# Patient Record
Sex: Male | Born: 1975 | Race: White | Hispanic: No | Marital: Married | Smoking: Former smoker
Health system: Southern US, Community
[De-identification: ages and names within clinical notes are randomized; demographics above are authoritative.]

## PROBLEM LIST (undated history)

## (undated) DIAGNOSIS — M1990 Unspecified osteoarthritis, unspecified site: Secondary | ICD-10-CM

## (undated) DIAGNOSIS — C20 Malignant neoplasm of rectum: Secondary | ICD-10-CM

## (undated) DIAGNOSIS — N200 Calculus of kidney: Secondary | ICD-10-CM

## (undated) DIAGNOSIS — Z8042 Family history of malignant neoplasm of prostate: Secondary | ICD-10-CM

## (undated) DIAGNOSIS — E079 Disorder of thyroid, unspecified: Secondary | ICD-10-CM

## (undated) HISTORY — DX: Family history of malignant neoplasm of prostate: Z80.42

## (undated) HISTORY — DX: Calculus of kidney: N20.0

## (undated) HISTORY — DX: Disorder of thyroid, unspecified: E07.9

## (undated) HISTORY — DX: Malignant neoplasm of rectum: C20

## (undated) HISTORY — DX: Unspecified osteoarthritis, unspecified site: M19.90

---

## 2018-07-16 ENCOUNTER — Other Ambulatory Visit: Payer: Self-pay

## 2018-07-16 ENCOUNTER — Ambulatory Visit (INDEPENDENT_AMBULATORY_CARE_PROVIDER_SITE_OTHER)
Admission: RE | Admit: 2018-07-16 | Discharge: 2018-07-16 | Disposition: A | Discharge status: Home / Self Care | Payer: BC Managed Care – PPO | Source: Ambulatory Visit | Attending: Family | Admitting: Family

## 2018-07-16 ENCOUNTER — Other Ambulatory Visit: Payer: Self-pay | Admitting: Family

## 2018-07-16 ENCOUNTER — Other Ambulatory Visit (INDEPENDENT_AMBULATORY_CARE_PROVIDER_SITE_OTHER): Payer: BC Managed Care – PPO

## 2018-07-16 ENCOUNTER — Encounter: Payer: Self-pay | Admitting: Family

## 2018-07-16 ENCOUNTER — Ambulatory Visit (INDEPENDENT_AMBULATORY_CARE_PROVIDER_SITE_OTHER): Payer: BC Managed Care – PPO | Admitting: Family

## 2018-07-16 VITALS — BP 114/80 | HR 58 | Temp 97.9°F | Ht 69.0 in | Wt 174.0 lb

## 2018-07-16 DIAGNOSIS — M542 Cervicalgia: Secondary | ICD-10-CM

## 2018-07-16 DIAGNOSIS — Z125 Encounter for screening for malignant neoplasm of prostate: Secondary | ICD-10-CM

## 2018-07-16 DIAGNOSIS — M546 Pain in thoracic spine: Secondary | ICD-10-CM

## 2018-07-16 DIAGNOSIS — Z0000 Encounter for general adult medical examination without abnormal findings: Secondary | ICD-10-CM | POA: Diagnosis not present

## 2018-07-16 DIAGNOSIS — R5383 Other fatigue: Secondary | ICD-10-CM

## 2018-07-16 DIAGNOSIS — E039 Hypothyroidism, unspecified: Secondary | ICD-10-CM

## 2018-07-16 DIAGNOSIS — Z8042 Family history of malignant neoplasm of prostate: Secondary | ICD-10-CM | POA: Insufficient documentation

## 2018-07-16 DIAGNOSIS — Z13220 Encounter for screening for lipoid disorders: Secondary | ICD-10-CM

## 2018-07-16 DIAGNOSIS — Z114 Encounter for screening for human immunodeficiency virus [HIV]: Secondary | ICD-10-CM | POA: Diagnosis not present

## 2018-07-16 DIAGNOSIS — G8929 Other chronic pain: Secondary | ICD-10-CM

## 2018-07-16 DIAGNOSIS — R899 Unspecified abnormal finding in specimens from other organs, systems and tissues: Secondary | ICD-10-CM

## 2018-07-16 LAB — CBC WITH DIFFERENTIAL/PLATELET
Basophils Absolute: 0 10*3/uL (ref 0.0–0.1)
Basophils Relative: 0.8 % (ref 0.0–3.0)
Eosinophils Absolute: 0.2 10*3/uL (ref 0.0–0.7)
Eosinophils Relative: 3 % (ref 0.0–5.0)
HCT: 41 % (ref 39.0–52.0)
Hemoglobin: 14.4 g/dL (ref 13.0–17.0)
Lymphocytes Relative: 26.2 % (ref 12.0–46.0)
Lymphs Abs: 1.4 10*3/uL (ref 0.7–4.0)
MCHC: 35 g/dL (ref 30.0–36.0)
MCV: 90.7 fl (ref 78.0–100.0)
Monocytes Absolute: 0.4 10*3/uL (ref 0.1–1.0)
Monocytes Relative: 8.6 % (ref 3.0–12.0)
Neutro Abs: 3.2 10*3/uL (ref 1.4–7.7)
Neutrophils Relative %: 61.4 % (ref 43.0–77.0)
Platelets: 138 10*3/uL — ABNORMAL LOW (ref 150.0–400.0)
RBC: 4.53 Mil/uL (ref 4.22–5.81)
RDW: 13.3 % (ref 11.5–15.5)
WBC: 5.3 10*3/uL (ref 4.0–10.5)

## 2018-07-16 LAB — COMPREHENSIVE METABOLIC PANEL
ALT: 13 U/L (ref 0–53)
AST: 18 U/L (ref 0–37)
Albumin: 4.6 g/dL (ref 3.5–5.2)
Alkaline Phosphatase: 52 U/L (ref 39–117)
BUN: 13 mg/dL (ref 6–23)
CO2: 27 mEq/L (ref 19–32)
Calcium: 9.2 mg/dL (ref 8.4–10.5)
Chloride: 105 mEq/L (ref 96–112)
Creatinine, Ser: 0.83 mg/dL (ref 0.40–1.50)
GFR: 100.76 mL/min (ref >60.00)
Glucose, Bld: 81 mg/dL (ref 70–99)
Potassium: 3.7 mEq/L (ref 3.5–5.1)
Sodium: 141 mEq/L (ref 135–145)
Total Bilirubin: 0.6 mg/dL (ref 0.2–1.2)
Total Protein: 6.8 g/dL (ref 6.0–8.3)

## 2018-07-16 LAB — LIPID PANEL
Cholesterol: 200 mg/dL (ref 0–200)
HDL: 41 mg/dL (ref >39.00)
NonHDL: 158.97
Total CHOL/HDL Ratio: 5
Triglycerides: 234 mg/dL — ABNORMAL HIGH (ref 0.0–149.0)
VLDL: 46.8 mg/dL — ABNORMAL HIGH (ref 0.0–40.0)

## 2018-07-16 LAB — LDL CHOLESTEROL, DIRECT: Direct LDL: 114 mg/dL

## 2018-07-16 LAB — TSH: TSH: 35.38 u[IU]/mL — ABNORMAL HIGH (ref 0.35–4.50)

## 2018-07-16 LAB — PSA: PSA: 0.79 ng/mL (ref 0.10–4.00)

## 2018-07-16 NOTE — Progress Notes (Signed)
Reviewed with patient; keep planned follow-up with Dr. Tamala Julian.

## 2018-07-16 NOTE — Progress Notes (Signed)
Edward [name] is a 43 y.o. male with the following history as recorded in Epic[facility]:  Patient Active Problem List   Diagnosis Date Noted  . Family history of prostate cancer in father 07/16/2018    No current outpatient medications on file.   No current facility-administered medications for this visit.     Allergies: Patient has no known allergies.  History reviewed. No pertinent past medical history.  History reviewed. No pertinent surgical history.  Family History  Problem Relation Age of Onset  . Depression Mother   . Asthma Father   . Cancer Father   . CAD Father   . Hyperlipidemia Father   . Hypertension Father     Social History   Tobacco Use  . Smoking status: Former Research scientist (life sciences)  . Smokeless tobacco: Never Used  Substance Use Topics  . Alcohol use: Yes    Comment: social    Subjective:  Patient presents today as a new patient;  needs to establish with PCP; last CPE 2+ years ago;  Acute right knee pain/chronic upper back pain; mentions chronic insomnia problems- feels that he wakes himself up around 4 am/ feels like he is snoring more but does not feel that he stops breathing; has occasional problems with hemorrhoids;   Up to date on dental and eye exams;   Review of Systems  Constitutional: Negative.   HENT: Negative.   Respiratory: Negative.   Cardiovascular: Negative.   Gastrointestinal:       Hemorrhoids; vegetarian x 20 years  Genitourinary: Positive for frequency.       History of kidney stones  Musculoskeletal: Positive for back pain, joint pain and neck pain.  Skin: Negative.   Neurological: Negative.   Endo/Heme/Allergies: Negative.   Psychiatric/Behavioral: The patient has insomnia.       Objective:  Vitals:   07/16/18 0905  BP: 114/80  Pulse: (!) 58  Temp: 97.9 F (36.6 C)  TempSrc: Oral  SpO2: 98%  Weight: 174 lb 0.6 oz (78.9 kg)  Height: _0  (1.753 m)    General: Well developed, well nourished, in no acute distress  Skin : Warm and  dry.  Head: Normocephalic and atraumatic  Eyes: Sclera and conjunctiva clear; pupils round and reactive to light; extraocular movements intact  Ears: External normal; canals clear; tympanic membranes normal  Oropharynx: Pink, supple. No suspicious lesions  Neck: Supple without thyromegaly, adenopathy  Lungs: Respirations unlabored; clear to auscultation bilaterally without wheeze, rales, rhonchi  CVS exam: normal rate and regular rhythm.  Abdomen: Soft; nontender; nondistended; normoactive bowel sounds; no masses or hepatosplenomegaly  Musculoskeletal: No deformities; no active joint inflammation  Extremities: No edema, cyanosis, clubbing  Vessels: Symmetric bilaterally  Neurologic: Alert and oriented; speech intact; face symmetrical; moves all extremities well; CNII-XII intact without focal deficit   Assessment:  1. PE (physical exam), annual   2. Lipid screening   3. Encounter for screening for HIV   4. Neck pain   5. Chronic thoracic back pain, unspecified back pain laterality   6. Other fatigue   7. Family history of prostate cancer in father     Plan:   Age appropriate preventive healthcare needs addressed; encouraged regular eye doctor and dental exams; encouraged regular exercise; will update labs and refills as needed today; follow-up to be determined; Refer to urology due to [place] of prostate cancer at young age; Follow-up with Dr. Tamala Julian to evaluate knee and back pain; will update X-rays today;  Patient will get more information from his  girlfriend to determine if she ever hears him stop breathing- may need to get sleep study/ could also consider Trazodone; Discussed use of stool softeners, fiber, water, OTC treatment to help with hemorrhoids; may need to refer to GI for further evaluation.    No follow-ups on file.  Orders Placed This Encounter  Procedures  . DG Cervical Spine 2 or 3 views    Standing Status:   Future    Number of Occurrences:   1    Standing Expiration  Date:   09/15/2019    Order Specific Question:   Reason for Exam (SYMPTOM  OR DIAGNOSIS REQUIRED)    Answer:   neck pain    Order Specific Question:   Preferred imaging location?    Answer:   [facility]    Order Specific Question:   Radiology Contrast Protocol - do NOT remove file path    Answer:   \\[email or web address]\DXFluoroContrastProtocols.pdf  . DG Thoracic Spine 2 View    Standing Status:   Future    Number of Occurrences:   1    Standing Expiration Date:   09/15/2019    Order Specific Question:   Reason for Exam (SYMPTOM  OR DIAGNOSIS REQUIRED)    Answer:   back pain    Order Specific Question:   Preferred imaging location?    Answer:   [facility]    Order Specific Question:   Radiology Contrast Protocol - do NOT remove file path    Answer:   \\[email or web address]\DXFluoroContrastProtocols.pdf  . CBC w/Diff    Standing Status:   Future    Standing Expiration Date:   07/16/2019  . Comp Met (CMET)    Standing Status:   Future    Standing Expiration Date:   07/16/2019  . Lipid panel    Standing Status:   Future    Standing Expiration Date:   07/16/2019  . PSA    Standing Status:   Future    Standing Expiration Date:   07/16/2019  . HIV Antibody (routine testing w rflx)    Standing Status:   Future    Standing Expiration Date:   07/16/2019  . TSH    Standing Status:   Future    Standing Expiration Date:   07/16/2019  . Ambulatory referral to Urology    Referral Priority:   Routine    Referral Type:   Consultation    Referral Reason:   Specialty Services Required    Requested Specialty:   Urology    Number of Visits Requested:   1    Requested Prescriptions    No prescriptions requested or ordered in this encounter

## 2018-07-16 NOTE — Progress Notes (Signed)
Reviewed with patient; he will come to the office for further thyroid testing; he understands need for thyroid ultrasound as well.

## 2018-07-16 NOTE — Progress Notes (Signed)
Discussed with patient; keep planned follow-up with Dr. Tamala Julian.

## 2018-07-17 ENCOUNTER — Other Ambulatory Visit: Payer: BC Managed Care – PPO

## 2018-07-17 DIAGNOSIS — E039 Hypothyroidism, unspecified: Secondary | ICD-10-CM

## 2018-07-17 DIAGNOSIS — R899 Unspecified abnormal finding in specimens from other organs, systems and tissues: Secondary | ICD-10-CM

## 2018-07-17 LAB — HIV ANTIBODY (ROUTINE TESTING W REFLEX): HIV 1&2 Ab, 4th Generation: NONREACTIVE

## 2018-07-18 ENCOUNTER — Other Ambulatory Visit: Payer: Self-pay | Admitting: Family

## 2018-07-18 LAB — THYROID PANEL WITH TSH
Free Thyroxine Index: 1.3 — ABNORMAL LOW (ref 1.4–3.8)
T3 Uptake: 27 % (ref 22–35)
T4, Total: 4.8 ug/dL — ABNORMAL LOW (ref 4.9–10.5)
TSH: 33.74 mIU/L — ABNORMAL HIGH (ref 0.40–4.50)

## 2018-07-18 LAB — THYROID PEROXIDASE ANTIBODY: Thyroperoxidase Ab SerPl-aCnc: 111 IU/mL — ABNORMAL HIGH (ref <9)

## 2018-07-18 LAB — THYROGLOBULIN ANTIBODY: Thyroglobulin Ab: 3 IU/mL — ABNORMAL HIGH (ref <=1)

## 2018-07-18 MED ORDER — LEVOTHYROXINE SODIUM 50 MCG PO TABS: 50.0000 ug | ORAL_TABLET | Freq: Every day | ORAL | 1 refills | Status: DC

## 2018-07-24 NOTE — Progress Notes (Signed)
[name] [address], [place] [address] Phone: [phone] Subjective:   I [name] am serving as a Education administrator for Dr. Hulan Saas.  I'm seeing this patient by the request  of:  Marrian Salvage, FNP   CC: Right knee and back pain  [id number]  Jacson Rapaport is a 43 y.o. male coming in with complaint of right knee and back pain. Injured his knee running. Pain in shoulder blades keeps him up at night.    Onset- 2 months (knee), chronic back (about 10 years) Location - superior patella, between the shoulder blades  Duration-  Character- achy, sharp, feels like knots is between his shoulder blades  Aggravating factors- down hill movement, lateral movement   Therapies tried- RICE, foam rolling, tennis ball, stretching, yoga  Severity-5 out of 10   X-rays taken July 16, 2018.  Independently visualized by me.  Anterior osteophytes at C3-C4 of the cervical spine thoracic x-ray is unremarkable.  No past medical history on file. No past surgical history on file. Social History   Socioeconomic History  . Marital status: Single    Spouse name: Not on file  . Number of children: Not on file  . Years of education: Not on file  . Highest education level: Not on file  Occupational History  . Not on file  Social Needs  . Financial resource strain: Not on file  . Food insecurity:    Worry: Not on file    Inability: Not on file  . Transportation needs:    Medical: Not on file    Non-medical: Not on file  Tobacco Use  . Smoking status: Former Research scientist (life sciences)  . Smokeless tobacco: Never Used  Substance and Sexual Activity  . Alcohol use: Yes    Comment: social  . Drug use: Not on file  . Sexual activity: Not on file  Lifestyle  . Physical activity:    Days per week: Not on file    Minutes per session: Not on file  . Stress: Not on file  Relationships  . Social connections:    Talks on phone: Not on file    Gets together: Not on  file    Attends religious service: Not on file    Active member of club or organization: Not on file    Attends meetings of clubs or organizations: Not on file    Relationship status: Not on file  Other Topics Concern  . Not on file  Social History Narrative  . Not on file   No Known Allergies Family History  Problem Relation Age of Onset  . Depression Mother   . Asthma Father   . Cancer Father   . CAD Father   . Hyperlipidemia Father   . Hypertension Father     Current Outpatient Medications (Endocrine & Metabolic):  .  levothyroxine (SYNTHROID) 50 MCG tablet, Take 1 tablet (50 mcg total) by mouth daily.      Current Outpatient Medications (Other):  Marland Kitchen  Diclofenac Sodium 2 % SOLN, Place 2 g onto the skin 2 (two) times daily.    Past medical history, social, surgical and family history all reviewed in electronic medical record.  No pertanent information unless stated regarding to the chief complaint.   Review of Systems:  No headache, visual changes, nausea, vomiting, diarrhea, constipation, dizziness, abdominal pain, skin rash, fevers, chills, night sweats, weight loss, swollen lymph nodes, body aches, joint swelling, muscle aches, chest pain, shortness of  breath, mood changes.   Objective  Blood pressure 122/82, pulse (!) 58, height 5\' 9"  (1.753 m), weight 173 lb (78.5 kg), SpO2 99 %.    General: No apparent distress alert and oriented x3 mood and affect normal, dressed appropriately.  HEENT: Pupils equal, extraocular movements intact  Respiratory: Patient's speak in full sentences and does not appear short of breath  Cardiovascular: No lower extremity edema, non tender, no erythema  Skin: Warm dry intact with no signs of infection or rash on extremities or on axial skeleton.  Abdomen: Soft nontender  Neuro: Cranial nerves II through XII are intact, neurovascularly intact in all extremities with 2+ DTRs and 2+ pulses.  Lymph: No lymphadenopathy of posterior or  anterior cervical chain or axillae bilaterally.  Gait normal with good balance and coordination.  MSK:  Non tender with full range of motion and good stability and symmetric strength and tone of shoulders, elbows, wrist, hip, and ankles bilaterally.  Knee: right  Normal to inspection with no erythema or effusion or obvious bony abnormalities. Palpation normal with no warmth, joint line tenderness, patellar tenderness, or condyle tenderness. ROM full in flexion and extension and lower leg rotation. Ligaments with solid consistent endpoints including ACL, PCL, LCL, MCL. Negative Mcmurray's, Apley's, and Thessalonian tests. Non painful patellar compression. Patellar glide with mild crepitus. Patellar and quadriceps tendons unremarkable. Hamstring and quadriceps strength is normal. Neck: Inspection loss of lordosis . No palpable stepoffs. Negative Spurling's maneuver. Some mild loss of range of motion. Grip strength and sensation normal in bilateral hands Strength good C4 to T1 distribution No sensory change to C4 to T1 Negative Hoffman sign bilaterally Reflexes normal Is in the left trapezius  MSK US performed of: Right knee This study was ordered, performed, and interpreted by Charlann Boxer D.O.  Knee: All structures visualized. Anteromedial, anterolateral, posteromedial, and posterolateral menisci unremarkable without tearing, fraying, effusion, or displacement.  Mild narrowing of the patellofemoral joint Patellar Tendon unremarkable on long and transverse views without effusion. No abnormality of prepatellar bursa. LCL and MCL unremarkable on long and transverse views. No abnormality of origin of medial or lateral head of the gastrocnemius.  IMPRESSION: Nothing significant mild narrowing of the patellofemoral   Impression and Recommendations:     This case required medical decision making of moderate complexity. The above documentation has been reviewed and is accurate and  complete Lyndal Pulley, DO       Note: This dictation was prepared with Dragon dictation along with smaller phrase technology. Any transcriptional errors that result from this process are unintentional.

## 2018-07-25 ENCOUNTER — Telehealth: Payer: Self-pay | Admitting: Family

## 2018-07-25 ENCOUNTER — Other Ambulatory Visit: Payer: Self-pay | Admitting: Family

## 2018-07-25 ENCOUNTER — Ambulatory Visit (INDEPENDENT_AMBULATORY_CARE_PROVIDER_SITE_OTHER): Payer: BC Managed Care – PPO | Admitting: Family Medicine

## 2018-07-25 ENCOUNTER — Other Ambulatory Visit: Payer: Self-pay

## 2018-07-25 ENCOUNTER — Encounter: Payer: Self-pay | Admitting: Family Medicine

## 2018-07-25 ENCOUNTER — Ambulatory Visit: Payer: Self-pay

## 2018-07-25 VITALS — BP 122/82 | HR 58 | Ht 69.0 in | Wt 173.0 lb

## 2018-07-25 DIAGNOSIS — G8929 Other chronic pain: Secondary | ICD-10-CM

## 2018-07-25 DIAGNOSIS — G2589 Other specified extrapyramidal and movement disorders: Secondary | ICD-10-CM | POA: Diagnosis not present

## 2018-07-25 DIAGNOSIS — M25561 Pain in right knee: Secondary | ICD-10-CM | POA: Insufficient documentation

## 2018-07-25 DIAGNOSIS — E039 Hypothyroidism, unspecified: Secondary | ICD-10-CM

## 2018-07-25 MED ORDER — DICLOFENAC SODIUM 2 % TD SOLN: 2.0000 g | Freq: Two times a day (BID) | TRANSDERMAL | 3 refills | Status: DC

## 2018-07-25 NOTE — Telephone Encounter (Signed)
Glitch on my end with ordering- thanks for letting us know; order has been fixed.

## 2018-07-25 NOTE — Assessment & Plan Note (Signed)
Scapular dyskinesis.  Discussed which activities of doing which wants to avoid.  Topical anti-inflammatories.  Worsening symptoms consider formal physical therapy.  Does have some arthritic changes in the neck but do not think it is likely contributing.

## 2018-07-25 NOTE — Patient Instructions (Addendum)
Good to see you.  Ice 20 minutes 2 times daily. Usually after activity and before bed. Exercises 3 times a week.  pennsaid pinkie amount topically 2 times daily as needed.  Hashimoto disease Vitamin D 2000 IU daily  See me again in 4-6 weeks

## 2018-07-25 NOTE — Telephone Encounter (Signed)
Patient was following up on an ultrasound that he said Edward [name] wanted done on his thyroid. I do not see any orders. Can you help with this?

## 2018-07-25 NOTE — Assessment & Plan Note (Signed)
Patellofemoral arthralgia.  Discussed with patient in great length about posture and ergonomics.  Which activities of doing which wants to avoid.  We discussed the importance of vastus medialis oblique strengthening.  Ultrasound did not show anything significant.  Follow-up again in 4 to 6 weeks

## 2018-08-02 ENCOUNTER — Ambulatory Visit
Admission: RE | Admit: 2018-08-02 | Discharge: 2018-08-02 | Disposition: A | Discharge status: Home / Self Care | Payer: BC Managed Care – PPO | Source: Ambulatory Visit | Attending: Family | Admitting: Family

## 2018-08-02 DIAGNOSIS — E039 Hypothyroidism, unspecified: Secondary | ICD-10-CM

## 2018-08-02 NOTE — Telephone Encounter (Signed)
Patient was called and contacted with appointment info. Test was scheduled for yesterday.

## 2018-08-13 ENCOUNTER — Other Ambulatory Visit: Payer: BC Managed Care – PPO

## 2018-08-13 ENCOUNTER — Encounter: Payer: Self-pay | Admitting: Family

## 2018-08-13 ENCOUNTER — Other Ambulatory Visit: Payer: Self-pay

## 2018-08-13 ENCOUNTER — Ambulatory Visit (INDEPENDENT_AMBULATORY_CARE_PROVIDER_SITE_OTHER): Payer: BC Managed Care – PPO | Admitting: Family

## 2018-08-13 VITALS — BP 112/72 | HR 57 | Temp 98.0°F | Ht 69.0 in | Wt 172.0 lb

## 2018-08-13 DIAGNOSIS — E039 Hypothyroidism, unspecified: Secondary | ICD-10-CM | POA: Diagnosis not present

## 2018-08-13 DIAGNOSIS — L989 Disorder of the skin and subcutaneous tissue, unspecified: Secondary | ICD-10-CM | POA: Diagnosis not present

## 2018-08-13 NOTE — Progress Notes (Signed)
  Edward [name] is a 43 y.o. male with the following history as recorded in Epic[facility]:  Patient Active Problem List   Diagnosis Date Noted  . Patellofemoral arthralgia of right knee 07/25/2018  . Scapular dyskinesis 07/25/2018  . Family history of prostate cancer in father 07/16/2018    Current Outpatient Medications  Medication Sig Dispense Refill  . cholecalciferol (VITAMIN D3) 25 MCG (1000 UT) tablet Take 1,000 Units by mouth 2 (two) times a day.    . Diclofenac Sodium 2 % SOLN Place 2 g onto the skin 2 (two) times daily. 112 g 3  . levothyroxine (SYNTHROID) 50 MCG tablet Take 1 tablet (50 mcg total) by mouth daily. 30 tablet 1   No current facility-administered medications for this visit.     Allergies: Patient has no known allergies.  History reviewed. No pertinent past medical history.  History reviewed. No pertinent surgical history.  Family History  Problem Relation Age of Onset  . Depression Mother   . Asthma Father   . Cancer Father   . CAD Father   . Hyperlipidemia Father   . Hypertension Father     Social History   Tobacco Use  . Smoking status: Former Research scientist (life sciences)  . Smokeless tobacco: Never Used  Substance Use Topics  . Alcohol use: Yes    Comment: social    Subjective:  Follow-up on recent start of Synthroid; notes that in general, has been feeling better; feels like he has slept better- finding it easier to get up/ focusing better; feels like GI symptoms discussed at last OV are normalizing as well;   Also requesting to see dermatology for "head to toe" skin check/ possible mole removal  Has been contacted by urology- will need to schedule appt/ also plans to ask about vasectomy  Objective:  Vitals:   08/13/18 0855  BP: 112/72  Pulse: (!) 57  Temp: 98 F (36.7 C)  TempSrc: Oral  SpO2: 97%  Weight: 172 lb 0.6 oz (78 kg)  Height: 5\' 9"  (1.753 m)    General: Well developed, well nourished, in no acute distress  Skin : Warm and dry.  Head: Normocephalic  and atraumatic  Neck: Supple without thyromegaly, adenopathy  Lungs: Respirations unlabored; Neurologic: Alert and oriented; speech intact; face symmetrical; moves all extremities well; CNII-XII intact without focal deficit   Assessment:  1. Hypothyroidism, unspecified type   2. Skin lesions, generalized     Plan:  1. Responding well to treatment; updated labs today- follow-up to be determined; reviewed ultrasound results as well; 2. Dermatology referral updated;   No follow-ups on file.  Orders Placed This Encounter  Procedures  . Thyroid Panel With TSH    Standing Status:   Future    Number of Occurrences:   1    Standing Expiration Date:   08/13/2019  . Ambulatory referral to Dermatology    Referral Priority:   Routine    Referral Type:   Consultation    Referral Reason:   Specialty Services Required    Requested Specialty:   Dermatology    Number of Visits Requested:   1    Requested Prescriptions    No prescriptions requested or ordered in this encounter

## 2018-08-14 ENCOUNTER — Other Ambulatory Visit: Payer: Self-pay | Admitting: Family

## 2018-08-14 DIAGNOSIS — E039 Hypothyroidism, unspecified: Secondary | ICD-10-CM

## 2018-08-14 LAB — THYROID PANEL WITH TSH
Free Thyroxine Index: 2.3 (ref 1.4–3.8)
T3 Uptake: 30 % (ref 22–35)
T4, Total: 7.7 ug/dL (ref 4.9–10.5)
TSH: 8.27 mIU/L — ABNORMAL HIGH (ref 0.40–4.50)

## 2018-08-14 MED ORDER — LEVOTHYROXINE SODIUM 75 MCG PO TABS: 75.0000 ug | ORAL_TABLET | Freq: Every day | ORAL | 1 refills | Status: DC

## 2018-08-29 ENCOUNTER — Ambulatory Visit (INDEPENDENT_AMBULATORY_CARE_PROVIDER_SITE_OTHER): Payer: BC Managed Care – PPO | Admitting: Family Medicine

## 2018-08-29 ENCOUNTER — Other Ambulatory Visit: Payer: Self-pay

## 2018-08-29 ENCOUNTER — Encounter: Payer: Self-pay | Admitting: Family Medicine

## 2018-08-29 VITALS — BP 124/90 | HR 56 | Ht 69.0 in

## 2018-08-29 DIAGNOSIS — M25512 Pain in left shoulder: Secondary | ICD-10-CM | POA: Insufficient documentation

## 2018-08-29 DIAGNOSIS — G8929 Other chronic pain: Secondary | ICD-10-CM | POA: Diagnosis not present

## 2018-08-29 DIAGNOSIS — M25561 Pain in right knee: Secondary | ICD-10-CM

## 2018-08-29 NOTE — Progress Notes (Signed)
[name] [address] [place], [place] [address] Phone: [phone] Subjective:   I [name] am serving as a Education administrator for Dr. Hulan Saas.  I'm seeing this patient by the request  of:  Marrian Salvage, FNP   CC: Back pain and right knee pain follow-up  [id number]   07/25/2018: Patellofemoral arthralgia.  Discussed with patient in great length about posture and ergonomics.  Which activities of doing which wants to avoid.  We discussed the importance of vastus medialis oblique strengthening.  Ultrasound did not show anything significant.  Follow-up again in 4 to 6 weeks  Scapular dyskinesis.  Discussed which activities of doing which wants to avoid.  Topical anti-inflammatories.  Worsening symptoms consider formal physical therapy.  Does have some arthritic changes in the neck but do not think it is likely contributing.  Edward [name] is a 43 y.o. male coming in with complaint of knee and shoulder pain. States he has had some improvement.  Patient shoulder seems to be doing well.  States that there is distinct places over the shoulder that seems to give him more discomfort and pain.  Sometimes affects daily activities.  Tries to workout regularly but finds it difficult secondary to pain.  Patient is also having still some knee pain but would state 85% better.  No locking or giving out on him.  Able to run up to 4 miles without any significant discomfort.     No past medical history on file. No past surgical history on file. Social History   Socioeconomic History  . Marital status: Single    Spouse name: Not on file  . Number of children: Not on file  . Years of education: Not on file  . Highest education level: Not on file  Occupational History  . Not on file  Social Needs  . Financial resource strain: Not on file  . Food insecurity    Worry: Not on file    Inability: Not on file  . Transportation needs    Medical: Not on file     Non-medical: Not on file  Tobacco Use  . Smoking status: Former Research scientist (life sciences)  . Smokeless tobacco: Never Used  Substance and Sexual Activity  . Alcohol use: Yes    Comment: social  . Drug use: Not on file  . Sexual activity: Not on file  Lifestyle  . Physical activity    Days per week: Not on file    Minutes per session: Not on file  . Stress: Not on file  Relationships  . Social Herbalist on phone: Not on file    Gets together: Not on file    Attends religious service: Not on file    Active member of club or organization: Not on file    Attends meetings of clubs or organizations: Not on file    Relationship status: Not on file  Other Topics Concern  . Not on file  Social History Narrative  . Not on file   No Known Allergies Family History  Problem Relation Age of Onset  . Depression Mother   . Asthma Father   . Cancer Father   . CAD Father   . Hyperlipidemia Father   . Hypertension Father     Current Outpatient Medications (Endocrine & Metabolic):  .  levothyroxine (SYNTHROID) 75 MCG tablet, Take 1 tablet (75 mcg total) by mouth daily.      Current Outpatient Medications (Other):  .  cholecalciferol (VITAMIN D3) 25 MCG (1000 UT) tablet, Take 1,000 Units by mouth 2 (two) times a day. .  Diclofenac Sodium 2 % SOLN, Place 2 g onto the skin 2 (two) times daily.    Past medical history, social, surgical and family history all reviewed in electronic medical record.  No pertanent information unless stated regarding to the chief complaint.   Review of Systems:  No headache, visual changes, nausea, vomiting, diarrhea, constipation, dizziness, abdominal pain, skin rash, fevers, chills, night sweats, weight loss, swollen lymph nodes, body aches, joint swelling, muscle aches, chest pain, shortness of breath, mood changes.   Objective  Blood pressure 124/90, pulse (!) 56, height 5\' 9"  (1.753 m), SpO2 98 %.    General: No apparent distress alert and oriented x3  mood and affect normal, dressed appropriately.  HEENT: Pupils equal, extraocular movements intact  Respiratory: Patient's speak in full sentences and does not appear short of breath  Cardiovascular: No lower extremity edema, non tender, no erythema  Skin: Warm dry intact with no signs of infection or rash on extremities or on axial skeleton.  Abdomen: Soft nontender  Neuro: Cranial nerves II through XII are intact, neurovascularly intact in all extremities with 2+ DTRs and 2+ pulses.  Lymph: No lymphadenopathy of posterior or anterior cervical chain or axillae bilaterally.  Gait normal with good balance and coordination.  MSK:  Non tender with full range of motion and good stability and symmetric strength and tone of shoulders, elbows, wrist, hip, and ankles bilaterally.  Knee: right  Normal to inspection with no erythema or effusion or obvious bony abnormalities. Palpation normal with no warmth, joint line tenderness, patellar tenderness, or condyle tenderness. ROM full in flexion and extension and lower leg rotation. Ligaments with solid consistent endpoints including ACL, PCL, LCL, MCL. Negative Mcmurray's, Apley's, and Thessalonian tests. Mild painful patellar compression. Patellar glide with mild crepitus. Patellar and quadriceps tendons unremarkable. Hamstring and quadriceps strength is normal. Contralateral knee unremarkable   Back Exam:  Inspection: Unremarkable  Motion: Flexion 45 deg, Extension 25 deg, Side Bending to 35 deg bilaterally,  Rotation to 45 deg bilaterally  SLR laying: Negative  XSLR laying: Negative  Palpable tenderness: Multiple trigger points noted in the left scapular region medial border.  Seems to be muscle and rhomboids.Marland Kitchen FABER: negative. Sensory change: Gross sensation intact to all lumbar and sacral dermatomes.  Reflexes: 2+ at both patellar tendons, 2+ at achilles tendons, Babinski's downgoing.  Strength at foot  Plantar-flexion: 5/5 Dorsi-flexion:  5/5 Eversion: 5/5 Inversion: 5/5  Leg strength  Quad: 5/5 Hamstring: 5/5 Hip flexor: 5/5 Hip abductors: 5/5  Gait unremarkable.  After verbal consent patient was prepped with alcohol swabs and with a 25-gauge 1 inch needle injected in 3 distinct trigger points in the left periscapular region.  3 cc of 0.5% Marcaine and 1 cc of Kenalog 40 mg/mL used.  Patient tolerated the procedure well.     Impression and Recommendations:     This case required medical decision making of moderate complexity. The above documentation has been reviewed and is accurate and complete Lyndal Pulley, DO       Note: This dictation was prepared with Dragon dictation along with smaller phrase technology. Any transcriptional errors that result from this process are unintentional.

## 2018-08-29 NOTE — Patient Instructions (Addendum)
Good to see you Tens and ultrasound at home Ice is your friend Keep hands within peripheral vision Look into theragun  Tried trigger point injections Knee looks good an decrease running slowly See me again in 6-8 weeks

## 2018-08-29 NOTE — Assessment & Plan Note (Signed)
Patient did have injections today.  Tolerated the procedure well.  Discussed icing regimen and home exercise.  Discussed topical anti-inflammatories.  Follow-up again 4 to 8 weeks.  Continue to work on the scapular dyskinesis

## 2018-08-29 NOTE — Assessment & Plan Note (Signed)
Improvement noted.  Continue with conservative therapy.  Increase activity as tolerated.  Did discuss the possibility of x-rays to further evaluate for any bony abnormality but I think patient is doing well.

## 2018-09-16 ENCOUNTER — Encounter: Payer: Self-pay | Admitting: Family

## 2018-09-17 ENCOUNTER — Other Ambulatory Visit: Payer: Self-pay | Admitting: Family

## 2018-09-17 ENCOUNTER — Other Ambulatory Visit (INDEPENDENT_AMBULATORY_CARE_PROVIDER_SITE_OTHER): Payer: BC Managed Care – PPO

## 2018-09-17 DIAGNOSIS — E039 Hypothyroidism, unspecified: Secondary | ICD-10-CM | POA: Diagnosis not present

## 2018-09-17 DIAGNOSIS — E063 Autoimmune thyroiditis: Secondary | ICD-10-CM

## 2018-09-17 DIAGNOSIS — E038 Other specified hypothyroidism: Secondary | ICD-10-CM

## 2018-09-17 LAB — TSH: TSH: 2.61 u[IU]/mL (ref 0.35–4.50)

## 2018-09-17 MED ORDER — LEVOTHYROXINE SODIUM 75 MCG PO TABS: 75.0000 ug | ORAL_TABLET | Freq: Every day | ORAL | 1 refills | Status: DC

## 2018-09-28 ENCOUNTER — Encounter: Payer: Self-pay | Admitting: Family Medicine

## 2018-09-28 ENCOUNTER — Encounter: Payer: Self-pay | Admitting: Family

## 2018-10-07 NOTE — Progress Notes (Deleted)
[name] [address] [place], [place] [address] Phone: [phone] Subjective:      CC: Left shoulder pain follow-up  [id number]  Lorik Boyea is a 43 y.o. male coming in with complaint of ***  Onset-  Location Duration-  Character- Aggravating factors- Reliving factors-  Therapies tried-  Severity-   Patient in previous exam was found to have more of a scapular dyskinesis is trigger points were given August 29, 2018.  No past medical history on file. No past surgical history on file. Social History   Socioeconomic History  . Marital status: Single    Spouse name: Not on file  . Number of children: Not on file  . Years of education: Not on file  . Highest education level: Not on file  Occupational History  . Not on file  Social Needs  . Financial resource strain: Not on file  . Food insecurity    Worry: Not on file    Inability: Not on file  . Transportation needs    Medical: Not on file    Non-medical: Not on file  Tobacco Use  . Smoking status: Former Research scientist (life sciences)  . Smokeless tobacco: Never Used  Substance and Sexual Activity  . Alcohol use: Yes    Comment: social  . Drug use: Not on file  . Sexual activity: Not on file  Lifestyle  . Physical activity    Days per week: Not on file    Minutes per session: Not on file  . Stress: Not on file  Relationships  . Social Herbalist on phone: Not on file    Gets together: Not on file    Attends religious service: Not on file    Active member of club or organization: Not on file    Attends meetings of clubs or organizations: Not on file    Relationship status: Not on file  Other Topics Concern  . Not on file  Social History Narrative  . Not on file   No Known Allergies Family History  Problem Relation Age of Onset  . Depression Mother   . Asthma Father   . Cancer Father   . CAD Father   . Hyperlipidemia Father   . Hypertension Father     Current  Outpatient Medications (Endocrine & Metabolic):  .  levothyroxine (SYNTHROID) 75 MCG tablet, Take 1 tablet (75 mcg total) by mouth daily.      Current Outpatient Medications (Other):  .  cholecalciferol (VITAMIN D3) 25 MCG (1000 UT) tablet, Take 1,000 Units by mouth 2 (two) times a day. .  Diclofenac Sodium 2 % SOLN, Place 2 g onto the skin 2 (two) times daily.    Past medical history, social, surgical and family history all reviewed in electronic medical record.  No pertanent information unless stated regarding to the chief complaint.   Review of Systems:  No headache, visual changes, nausea, vomiting, diarrhea, constipation, dizziness, abdominal pain, skin rash, fevers, chills, night sweats, weight loss, swollen lymph nodes, body aches, joint swelling, muscle aches, chest pain, shortness of breath, mood changes.   Objective  There were no vitals taken for this visit. Systems examined below as of    General: No apparent distress alert and oriented x3 mood and affect normal, dressed appropriately.  HEENT: Pupils equal, extraocular movements intact  Respiratory: Patient's speak in full sentences and does not appear short of breath  Cardiovascular: No lower extremity edema, non tender, no erythema  Skin: Warm dry intact with no signs of infection or rash on extremities or on axial skeleton.  Abdomen: Soft nontender  Neuro: Cranial nerves II through XII are intact, neurovascularly intact in all extremities with 2+ DTRs and 2+ pulses.  Lymph: No lymphadenopathy of posterior or anterior cervical chain or axillae bilaterally.  Gait normal with good balance and coordination.  MSK:  Non tender with full range of motion and good stability and symmetric strength and tone of shoulders, elbows, wrist, hip, knee and ankles bilaterally.     Impression and Recommendations:     This case required medical decision making of moderate complexity. The above documentation has been reviewed and is  accurate and complete Lyndal Pulley, DO       Note: This dictation was prepared with Dragon dictation along with smaller phrase technology. Any transcriptional errors that result from this process are unintentional.

## 2018-10-08 ENCOUNTER — Ambulatory Visit: Payer: BC Managed Care – PPO | Admitting: Family Medicine

## 2018-10-08 DIAGNOSIS — Z0289 Encounter for other administrative examinations: Secondary | ICD-10-CM

## 2018-10-15 NOTE — Progress Notes (Signed)
[name] [place], [place] [address] Phone: [phone] Subjective:     CC: Left shoulder, right knee pain follow-up  [id number]  Edward [name] is a 43 y.o. male coming in with complaint of   Left shoulder pain.  Patient was found to have more trigger points problem of the left shoulder.  Given injections and has been doing a home exercises.  States that it had been significantly improved but seems to be worsening again.    No past medical history on file. No past surgical history on file. Social History   Socioeconomic History  . Marital status: Single    Spouse name: Not on file  . Number of children: Not on file  . Years of education: Not on file  . Highest education level: Not on file  Occupational History  . Not on file  Social Needs  . Financial resource strain: Not on file  . Food insecurity    Worry: Not on file    Inability: Not on file  . Transportation needs    Medical: Not on file    Non-medical: Not on file  Tobacco Use  . Smoking status: Former Research scientist (life sciences)  . Smokeless tobacco: Never Used  Substance and Sexual Activity  . Alcohol use: Yes    Comment: social  . Drug use: Not on file  . Sexual activity: Not on file  Lifestyle  . Physical activity    Days per week: Not on file    Minutes per session: Not on file  . Stress: Not on file  Relationships  . Social Herbalist on phone: Not on file    Gets together: Not on file    Attends religious service: Not on file    Active member of club or organization: Not on file    Attends meetings of clubs or organizations: Not on file    Relationship status: Not on file  Other Topics Concern  . Not on file  Social History Narrative  . Not on file   No Known Allergies Family History  Problem Relation Age of Onset  . Depression Mother   . Asthma Father   . Cancer Father   . CAD Father   . Hyperlipidemia Father   . Hypertension Father     Current  Outpatient Medications (Endocrine & Metabolic):  .  levothyroxine (SYNTHROID) 75 MCG tablet, Take 1 tablet (75 mcg total) by mouth daily.      Current Outpatient Medications (Other):  .  cholecalciferol (VITAMIN D3) 25 MCG (1000 UT) tablet, Take 1,000 Units by mouth 2 (two) times a day. .  Diclofenac Sodium 2 % SOLN, Place 2 g onto the skin 2 (two) times daily.    Past medical history, social, surgical and family history all reviewed in electronic medical record.  No pertanent information unless stated regarding to the chief complaint.   Review of Systems:  No headache, visual changes, nausea, vomiting, diarrhea, constipation, dizziness, abdominal pain, skin rash, fevers, chills, night sweats, weight loss, swollen lymph nodes, body aches, joint swelling, muscle aches, chest pain, shortness of breath, mood changes.   Objective  Height 5\' 9"  (1.753 m), weight 172 lb (78 kg).    General: No apparent distress alert and oriented x3 mood and affect normal, dressed appropriately.  HEENT: Pupils equal, extraocular movements intact  Respiratory: Patient's speak in full sentences and does not appear short of breath  Cardiovascular: No lower extremity edema, non  tender, no erythema  Skin: Warm dry intact with no signs of infection or rash on extremities or on axial skeleton.  Abdomen: Soft nontender  Neuro: Cranial nerves II through XII are intact, neurovascularly intact in all extremities with 2+ DTRs and 2+ pulses.  Lymph: No lymphadenopathy of posterior or anterior cervical chain or axillae bilaterally.  Gait normal with good balance and coordination.  MSK:  Non tender with full range of motion and good stability and symmetric strength and tone of  elbows, wrist, hip and ankles bilaterally.   Right knee exam has full range of motion.  Minimally tender to palpation over the patellofemoral joint but otherwise unremarkable.  Improvement from previous exam   Left shoulder exam has full range  of motion but trigger point nodules noted within the parascapular musculature.  4 in total.  After verbal consent patient was prepped with alcohol swabs and with a 25-gauge half inch needle injected and 4 distinct trigger points in the left shoulder region.  Total of 3 cc of 0.5% Marcaine and 1 cc of Kenalog 40 mg/mL used.   Impression and Recommendations:     This case required medical decision making of moderate complexity. The above documentation has been reviewed and is accurate and complete Lyndal Pulley, DO       Note: This dictation was prepared with Dragon dictation along with smaller phrase technology. Any transcriptional errors that result from this process are unintentional.

## 2018-10-16 ENCOUNTER — Other Ambulatory Visit: Payer: Self-pay

## 2018-10-16 ENCOUNTER — Ambulatory Visit: Payer: BC Managed Care – PPO | Admitting: Family Medicine

## 2018-10-16 ENCOUNTER — Encounter: Payer: Self-pay | Admitting: Family Medicine

## 2018-10-16 DIAGNOSIS — M25512 Pain in left shoulder: Secondary | ICD-10-CM

## 2018-10-16 DIAGNOSIS — M25561 Pain in right knee: Secondary | ICD-10-CM | POA: Diagnosis not present

## 2018-10-16 DIAGNOSIS — G2589 Other specified extrapyramidal and movement disorders: Secondary | ICD-10-CM | POA: Diagnosis not present

## 2018-10-16 NOTE — Assessment & Plan Note (Signed)
Patient given repeat injections today.  Tolerated the procedure well.  Discussed which activities of doing which wants to avoid.  Follow-up again in 4 to 8 weeks.

## 2018-10-16 NOTE — Assessment & Plan Note (Signed)
Improvement noted.  No change in management follow-up as needed

## 2018-10-16 NOTE — Assessment & Plan Note (Signed)
Another injection was given today.  Discussed with patient very well.  Discussed home exercises icing regimen, which activities to do which was to avoid.  Follow-up again in 4 to 8 weeks

## 2018-10-16 NOTE — Patient Instructions (Signed)
Tried manipulation and trigger point injection today Keep working on posture and doing exercises See me again in 6-8 weeks

## 2018-11-02 ENCOUNTER — Encounter: Payer: Self-pay | Admitting: Family

## 2018-11-05 ENCOUNTER — Other Ambulatory Visit: Payer: Self-pay

## 2018-11-05 ENCOUNTER — Ambulatory Visit (INDEPENDENT_AMBULATORY_CARE_PROVIDER_SITE_OTHER): Payer: BC Managed Care – PPO | Admitting: Family

## 2018-11-05 ENCOUNTER — Encounter: Payer: Self-pay | Admitting: Family

## 2018-11-05 VITALS — BP 110/80 | HR 64 | Temp 98.1°F | Ht 69.0 in | Wt 171.2 lb

## 2018-11-05 DIAGNOSIS — Z23 Encounter for immunization: Secondary | ICD-10-CM

## 2018-11-05 DIAGNOSIS — L237 Allergic contact dermatitis due to plants, except food: Secondary | ICD-10-CM

## 2018-11-05 DIAGNOSIS — K458 Other specified abdominal hernia without obstruction or gangrene: Secondary | ICD-10-CM

## 2018-11-05 MED ORDER — BETAMETHASONE DIPROPIONATE AUG 0.05 % EX CREA: TOPICAL_CREAM | Freq: Two times a day (BID) | CUTANEOUS | 0 refills | Status: DC

## 2018-11-05 NOTE — Progress Notes (Signed)
Edward [name] is a 43 y.o. male with the following history as recorded in [employer]:  Patient Active Problem List   Diagnosis Date Noted  . Trigger point of left shoulder region 08/29/2018  . Patellofemoral arthralgia of right knee 07/25/2018  . Scapular dyskinesis 07/25/2018  . Family history of prostate cancer in father 07/16/2018    Current Outpatient Medications  Medication Sig Dispense Refill  . cholecalciferol (VITAMIN D3) 25 MCG (1000 UT) tablet Take 1,000 Units by mouth 2 (two) times a day.    . Diclofenac Sodium 2 % SOLN Place 2 g onto the skin 2 (two) times daily. 112 g 3  . levothyroxine (SYNTHROID) 75 MCG tablet Take 1 tablet (75 mcg total) by mouth daily. 90 tablet 1  . augmented betamethasone dipropionate (DIPROLENE AF) 0.05 % cream Apply topically 2 (two) times daily. 50 g 0   No current facility-administered medications for this visit.     Allergies: Patient has no known allergies.  History reviewed. No pertinent past medical history.  History reviewed. No pertinent surgical history.  Family History  Problem Relation Age of Onset  . Depression Mother   . Asthma Father   . Cancer Father   . CAD Father   . Hyperlipidemia Father   . Hypertension Father     Social History   Tobacco Use  . Smoking status: Former Research scientist (life sciences)  . Smokeless tobacco: Never Used  Substance Use Topics  . Alcohol use: Yes    Comment: social    Subjective:  Patient presents with concerns for abdominal wall hernia; notes area has been present "for a while" and seems to getting more pronounced recently; most noticeable with lying down at a 45 degree angle; no changes in bowel movements; no burping, belching; Would like to get his flu shot updated today;  Also notes that he is recovering from poison ivy on his right arm; is prone to recurrent poison ivy;      Objective:  Vitals:   11/05/18 1334  BP: 110/80  Pulse: 64  Temp: 98.1 F (36.7 C)  TempSrc: Oral  SpO2: 97%  Weight: 171 lb  3.2 oz (77.7 kg)  Height: 5\' 9"  (1.753 m)    General: Well developed, well nourished, in no acute distress  Skin : Warm and dry. C/w resolving poison ivy on right forearm;  Head: Normocephalic and atraumatic  Eyes: Sclera and conjunctiva clear; pupils round and reactive to light; extraocular movements intact  Ears: External normal; canals clear; tympanic membranes normal  Oropharynx: Pink, supple. No suspicious lesions  Neck: Supple without thyromegaly, adenopathy  Lungs: Respirations unlabored; clear to auscultation bilaterally without wheeze, rales, rhonchi  Abdomen: Soft; nontender; nondistended; normoactive bowel sounds; localized area of bulging noted in mid-abdomen  Neurologic: Alert and oriented; speech intact; face symmetrical; moves all extremities well; CNII-XII intact without focal deficit   Assessment:  1. Other specified abdominal hernia without obstruction or gangrene   2. Allergic contact dermatitis due to plants, except food   3. Needs flu shot     Plan:  1. Will update CT to evaluate for hernia vs diastasis recti; follow-up to be determined;  2. Rx for Diprolene cream apply bid to affected area; 3. Flu shot updated;   No follow-ups on file.  Orders Placed This Encounter  Procedures  . CT Abdomen Pelvis W Contrast    Standing Status:   Future    Standing Expiration Date:   02/04/2020    Order Specific Question:   **  REASON FOR EXAM (FREE TEXT)    Answer:   concern for hernia    Order Specific Question:   If indicated for the ordered procedure, I authorize the administration of contrast media per Radiology protocol    Answer:   Yes    Order Specific Question:   Preferred imaging location?    Answer:   [id number]    Order Specific Question:   Is Oral Contrast requested for this exam?    Answer:   Yes, Per Radiology protocol    Order Specific Question:   Radiology Contrast Protocol - do NOT remove file path    Answer:    \\[email or web address]    Requested Prescriptions   Signed Prescriptions Disp Refills  . augmented betamethasone dipropionate (DIPROLENE AF) 0.05 % cream 50 g 0    Sig: Apply topically 2 (two) times daily.

## 2018-11-05 NOTE — Addendum Note (Signed)
Addended by: Marcina Millard on: 11/05/2018 02:42 PM   Modules accepted: Orders

## 2018-11-20 ENCOUNTER — Ambulatory Visit
Admission: RE | Admit: 2018-11-20 | Discharge: 2018-11-20 | Disposition: A | Discharge status: Home / Self Care | Payer: BC Managed Care – PPO | Source: Ambulatory Visit | Attending: Family | Admitting: Family

## 2018-11-20 ENCOUNTER — Other Ambulatory Visit: Payer: Self-pay

## 2018-11-20 DIAGNOSIS — K458 Other specified abdominal hernia without obstruction or gangrene: Secondary | ICD-10-CM

## 2018-11-20 MED ORDER — IOPAMIDOL (ISOVUE-300) INJECTION 61%
100.0000 mL | Freq: Once | INTRAVENOUS | Status: AC | PRN
  Administered 2018-11-20: 15:00:00 100 mL via INTRAVENOUS

## 2018-11-21 ENCOUNTER — Encounter: Payer: Self-pay | Admitting: Family

## 2018-11-25 DIAGNOSIS — M999 Biomechanical lesion, unspecified: Secondary | ICD-10-CM | POA: Insufficient documentation

## 2018-11-25 NOTE — Assessment & Plan Note (Signed)
Decision today to treat with OMT was based on Physical Exam  After verbal consent patient was treated with HVLA, ME, FPR techniques in cervical, thoracic rib, lumbar and sacral areas  Patient tolerated the procedure well with improvement in symptoms  Patient given exercises, stretches and lifestyle modifications  See medications in patient instructions if given  Patient will follow up in 4-8 weeks 

## 2018-11-25 NOTE — Assessment & Plan Note (Signed)
Continue difficulty.  Discussed which activities of doing which wants to avoid.  Patient is to increase activity as tolerated.  Patient will follow-up with me again 8 weeks.  Responded well to manipulation

## 2018-11-25 NOTE — Progress Notes (Signed)
[name] [place], [place] [address] Phone: [phone] Subjective:   Edward [name], am serving as a scribe for Dr. Hulan Saas.  I'm seeing this patient by the request  of:    CC: Back pain follow-up, knee pain  [id number]   10/16/2018 Patient given repeat injections today.  Tolerated the procedure well.  Discussed which activities of doing which wants to avoid.  Follow-up again in 4 to 8 weeks.  Update 11/27/2018 Edward [name] is a 43 y.o. male coming in with complaint of thoracic spine pain. Had some relief from the injections but his pain has come back. Pain is worse at night.Throughout the day he has less flare ups.  Had CT on 10/6 for abdominal strain. Has a bulge in the abdomen when he does crunches. Has achy pain but not sharp pain with crunches.   CT impression:  1. [name] evidence of abdominal hernia or other findings to explain upper abdominal pain.  2.  Horseshoe kidney.  [name] hydronephrosis or other abnormality.       [name] past medical history on file. [name] past surgical history on file. Social History   Socioeconomic History  . Marital status: Single    Spouse name: Not on file  . Number of children: Not on file  . Years of education: Not on file  . Highest education level: Not on file  Occupational History  . Not on file  Social Needs  . Financial resource strain: Not on file  . Food insecurity    Worry: Not on file    Inability: Not on file  . Transportation needs    Medical: Not on file    Non-medical: Not on file  Tobacco Use  . Smoking status: Former Research scientist (life sciences)  . Smokeless tobacco: Never Used  Substance and Sexual Activity  . Alcohol use: Yes    Comment: social  . Drug use: Not on file  . Sexual activity: Not on file  Lifestyle  . Physical activity    Days per week: Not on file    Minutes per session: Not on file  . Stress: Not on file  Relationships  . Social Herbalist on phone: Not  on file    Gets together: Not on file    Attends religious service: Not on file    Active member of club or organization: Not on file    Attends meetings of clubs or organizations: Not on file    Relationship status: Not on file  Other Topics Concern  . Not on file  Social History Narrative  . Not on file   [name] Known Allergies Family History  Problem Relation Age of Onset  . Depression Mother   . Asthma Father   . Cancer Father   . CAD Father   . Hyperlipidemia Father   . Hypertension Father     Current Outpatient Medications (Endocrine & Metabolic):  .  levothyroxine (SYNTHROID) 75 MCG tablet, Take 1 tablet (75 mcg total) by mouth daily.      Current Outpatient Medications (Other):  .  augmented betamethasone dipropionate (DIPROLENE AF) 0.05 % cream, Apply topically 2 (two) times daily. .  cholecalciferol (VITAMIN D3) 25 MCG (1000 UT) tablet, Take 1,000 Units by mouth 2 (two) times a day. .  Diclofenac Sodium 2 % SOLN, Place 2 g onto the skin 2 (two) times daily.    Past medical history, social, surgical and family history all reviewed  in electronic medical record.  [name] pertanent information unless stated regarding to the chief complaint.   Review of Systems:  [name] headache, visual changes, nausea, vomiting, diarrhea, constipation, dizziness, abdominal pain, skin rash, fevers, chills, night sweats, weight loss, swollen lymph nodes, body aches, joint swelling, muscle aches, chest pain, shortness of breath, mood changes.   Objective  Blood pressure 110/78, pulse 67, height 5\' 9"  (1.753 m), weight 174 lb (78.9 kg), SpO2 98 %.    General: [name] apparent distress alert and oriented x3 mood and affect normal, dressed appropriately.  HEENT: Pupils equal, extraocular movements intact  Respiratory: Patient's speak in full sentences and does not appear short of breath  Cardiovascular: [name] lower extremity edema, non tender, [name] erythema  Skin: Warm dry intact with [name] signs of infection  or rash on extremities or on axial skeleton.  Abdomen: Soft nontender  Neuro: Cranial nerves II through XII are intact, neurovascularly intact in all extremities with 2+ DTRs and 2+ pulses.  Lymph: [name] lymphadenopathy of posterior or anterior cervical chain or axillae bilaterally.  Gait normal with good balance and coordination.  MSK:  Non tender with full range of motion and good stability and symmetric strength and tone of shoulders, elbows, wrist, hip and ankles bilaterally.    Back Exam:  Inspection: Unremarkable  Motion: Flexion 45 deg, Extension 25 deg, Side Bending to 35 deg bilaterally,  Rotation to 45 deg bilaterally  SLR laying: Negative  XSLR laying: Negative  Palpable tenderness: Positive pain in the paraspinal musculature.Marland Kitchen FABER: Tightness bilaterally . Sensory change: Gross sensation intact to all lumbar and sacral dermatomes.  Reflexes: 2+ at both patellar tendons, 2+ at achilles tendons, Babinski's downgoing.  Strength at foot  Plantar-flexion: 5/5 Dorsi-flexion: 5/5 Eversion: 5/5 Inversion: 5/5  Leg strength  Quad: 5/5 Hamstring: 5/5 Hip flexor: 5/5 Hip abductors: 5/5  Gait unremarkable.  Osteopathic findings  C2 flexed rotated and side bent right C4 flexed rotated and side bent left C7 flexed rotated and side bent left T3 extended rotated and side bent right inhaled third rib T9 extended rotated and side bent left L2 flexed rotated and side bent right Sacrum right on right    Impression and Recommendations:     This case required medical decision making of moderate complexity. The above documentation has been reviewed and is accurate and complete Lyndal Pulley, DO       Note: This dictation was prepared with Dragon dictation along with smaller phrase technology. Any transcriptional errors that result from this process are unintentional.

## 2018-11-27 ENCOUNTER — Encounter: Payer: Self-pay | Admitting: Family Medicine

## 2018-11-27 ENCOUNTER — Ambulatory Visit (INDEPENDENT_AMBULATORY_CARE_PROVIDER_SITE_OTHER): Payer: BC Managed Care – PPO | Admitting: Family Medicine

## 2018-11-27 ENCOUNTER — Other Ambulatory Visit: Payer: Self-pay

## 2018-11-27 DIAGNOSIS — G2589 Other specified extrapyramidal and movement disorders: Secondary | ICD-10-CM | POA: Diagnosis not present

## 2018-11-27 DIAGNOSIS — M999 Biomechanical lesion, unspecified: Secondary | ICD-10-CM | POA: Diagnosis not present

## 2018-11-27 NOTE — Patient Instructions (Signed)
Keep doing exercises Focus on core with isometrics See me in 4-6 weeks

## 2018-12-30 NOTE — Assessment & Plan Note (Signed)
Continues to have difficulty.  Discussed again ergonomics, home exercise, which activities to do which wants to avoid.  Patient is to increase activity slowly over the course the next several weeks.  Follow-up with me again in 6 to 8 weeks

## 2018-12-30 NOTE — Assessment & Plan Note (Signed)
Decision today to treat with OMT was based on Physical Exam  After verbal consent patient was treated with HVLA, ME, FPR techniques in cervical, thoracic, rib, lumbar and sacral areas  Patient tolerated the procedure well with improvement in symptoms  Patient given exercises, stretches and lifestyle modifications  See medications in patient instructions if given  Patient will follow up in 6-8 weeks 

## 2018-12-30 NOTE — Progress Notes (Signed)
[name] [place], [place] [address] Phone: [phone] Subjective:   Edward [name], am serving as a scribe for Dr. Hulan Saas.  CC: Neck and back pain follow-up  [id number]   11/27/2018 Continue difficulty.  Discussed which activities of doing which wants to avoid.  Patient is to increase activity as tolerated.  Patient will follow-up with me again 8 weeks.  Responded well to manipulation  Update 12/31/2018 Edward [name] is a 43 y.o. male coming in with complaint of left scapula pain. Patient does feel that he is improving.  Patient states he is only aware of it at this moment.  Does not stop him from activities.  Has responded fairly well to manipulation in the past.  Feels like "he can do other modalities at the moment.      [name] past medical history on file. [name] past surgical history on file. Social History   Socioeconomic History  . Marital status: Single    Spouse name: Not on file  . Number of children: Not on file  . Years of education: Not on file  . Highest education level: Not on file  Occupational History  . Not on file  Social Needs  . Financial resource strain: Not on file  . Food insecurity    Worry: Not on file    Inability: Not on file  . Transportation needs    Medical: Not on file    Non-medical: Not on file  Tobacco Use  . Smoking status: Former Research scientist (life sciences)  . Smokeless tobacco: Never Used  Substance and Sexual Activity  . Alcohol use: Yes    Comment: social  . Drug use: Not on file  . Sexual activity: Not on file  Lifestyle  . Physical activity    Days per week: Not on file    Minutes per session: Not on file  . Stress: Not on file  Relationships  . Social Herbalist on phone: Not on file    Gets together: Not on file    Attends religious service: Not on file    Active member of club or organization: Not on file    Attends meetings of clubs or organizations: Not on file   Relationship status: Not on file  Other Topics Concern  . Not on file  Social History Narrative  . Not on file   [name] Known Allergies Family History  Problem Relation Age of Onset  . Depression Mother   . Asthma Father   . Cancer Father   . CAD Father   . Hyperlipidemia Father   . Hypertension Father     Current Outpatient Medications (Endocrine & Metabolic):  .  levothyroxine (SYNTHROID) 75 MCG tablet, Take 1 tablet (75 mcg total) by mouth daily.      Current Outpatient Medications (Other):  .  augmented betamethasone dipropionate (DIPROLENE AF) 0.05 % cream, Apply topically 2 (two) times daily. .  cholecalciferol (VITAMIN D3) 25 MCG (1000 UT) tablet, Take 1,000 Units by mouth 2 (two) times a day. .  Diclofenac Sodium 2 % SOLN, Place 2 g onto the skin 2 (two) times daily.    Past medical history, social, surgical and family history all reviewed in electronic medical record.  [name] pertanent information unless stated regarding to the chief complaint.   Review of Systems:  [name] headache, visual changes, nausea, vomiting, diarrhea, constipation, dizziness, abdominal pain, skin rash, fevers, chills, night sweats, weight loss, swollen lymph nodes,  body aches, joint swelling, muscle aches, chest pain, shortness of breath, mood changes.   Objective  There were [name] vitals taken for this visit. Systems examined below as of    General: [name] apparent distress alert and oriented x3 mood and affect normal, dressed appropriately.  HEENT: Pupils equal, extraocular movements intact  Respiratory: Patient's speak in full sentences and does not appear short of breath  Cardiovascular: [name] lower extremity edema, non tender, [name] erythema  Skin: Warm dry intact with [name] signs of infection or rash on extremities or on axial skeleton.  Abdomen: Soft nontender  Neuro: Cranial nerves II through XII are intact, neurovascularly intact in all extremities with 2+ DTRs and 2+ pulses.  Lymph: [name] lymphadenopathy of  posterior or anterior cervical chain or axillae bilaterally.  Gait normal with good balance and coordination.  MSK:  Non tender with full range of motion and good stability and symmetric strength and tone of shoulders, elbows, wrist, hip, knee and ankles bilaterally.  Neck: Inspection unremarkable. [name] palpable stepoffs. Negative Spurling's maneuver. Full neck range of motion Grip strength and sensation normal in bilateral hands Strength good C4 to T1 distribution [name] sensory change to C4 to T1 Negative Hoffman sign bilaterally Reflexes normal  Back Exam:  Inspection: Unremarkable  Motion: Flexion 45 deg, Extension 45 deg, Side Bending to 45 deg bilaterally,  Rotation to 45 deg bilaterally  SLR laying: Negative  XSLR laying: Negative  Palpable tenderness: None. FABER: negative. Sensory change: Gross sensation intact to all lumbar and sacral dermatomes.  Reflexes: 2+ at both patellar tendons, 2+ at achilles tendons, Babinski's downgoing.  Strength at foot  Plantar-flexion: 5/5 Dorsi-flexion: 5/5 Eversion: 5/5 Inversion: 5/5  Leg strength  Quad: 5/5 Hamstring: 5/5 Hip flexor: 5/5 Hip abductors: 5/5  Gait unremarkable.  Osteopathic findings  C2 flexed rotated and side bent right C4 flexed rotated and side bent left C7 flexed rotated and side bent left T3 extended rotated and side bent right inhaled third rib T9 extended rotated and side bent left L2 flexed rotated and side bent right Sacrum right on right    Impression and Recommendations:     This case required medical decision making of moderate complexity. The above documentation has been reviewed and is accurate and complete Lyndal Pulley, DO       Note: This dictation was prepared with Dragon dictation along with smaller phrase technology. Any transcriptional errors that result from this process are unintentional.

## 2018-12-31 ENCOUNTER — Encounter: Payer: Self-pay | Admitting: Family Medicine

## 2018-12-31 ENCOUNTER — Other Ambulatory Visit: Payer: Self-pay

## 2018-12-31 ENCOUNTER — Ambulatory Visit (INDEPENDENT_AMBULATORY_CARE_PROVIDER_SITE_OTHER): Payer: BC Managed Care – PPO | Admitting: Family Medicine

## 2018-12-31 VITALS — BP 110/80 | HR 75 | Ht 69.0 in | Wt 173.0 lb

## 2018-12-31 DIAGNOSIS — M999 Biomechanical lesion, unspecified: Secondary | ICD-10-CM

## 2018-12-31 DIAGNOSIS — G2589 Other specified extrapyramidal and movement disorders: Secondary | ICD-10-CM

## 2019-01-14 ENCOUNTER — Encounter: Payer: Self-pay | Admitting: Family Medicine

## 2019-03-05 ENCOUNTER — Encounter: Payer: Self-pay | Admitting: Family

## 2019-04-09 ENCOUNTER — Encounter: Payer: Self-pay | Admitting: Family

## 2019-04-09 ENCOUNTER — Other Ambulatory Visit: Payer: Self-pay | Admitting: Family

## 2019-04-10 ENCOUNTER — Other Ambulatory Visit (INDEPENDENT_AMBULATORY_CARE_PROVIDER_SITE_OTHER): Payer: BC Managed Care – PPO

## 2019-04-10 DIAGNOSIS — E038 Other specified hypothyroidism: Secondary | ICD-10-CM

## 2019-04-10 DIAGNOSIS — E063 Autoimmune thyroiditis: Secondary | ICD-10-CM

## 2019-04-10 LAB — TSH: TSH: 2.3 u[IU]/mL (ref 0.35–4.50)

## 2019-04-25 ENCOUNTER — Ambulatory Visit: Payer: BC Managed Care – PPO

## 2019-06-25 ENCOUNTER — Encounter: Payer: Self-pay | Admitting: Family

## 2019-07-05 ENCOUNTER — Ambulatory Visit: Payer: BC Managed Care – PPO | Admitting: Podiatry

## 2019-07-05 ENCOUNTER — Other Ambulatory Visit: Payer: Self-pay

## 2019-07-05 ENCOUNTER — Ambulatory Visit (INDEPENDENT_AMBULATORY_CARE_PROVIDER_SITE_OTHER): Payer: BC Managed Care – PPO

## 2019-07-05 ENCOUNTER — Encounter: Payer: Self-pay | Admitting: Podiatry

## 2019-07-05 VITALS — BP 128/75 | HR 66 | Temp 97.2°F | Resp 16

## 2019-07-05 DIAGNOSIS — M79671 Pain in right foot: Secondary | ICD-10-CM | POA: Diagnosis not present

## 2019-07-05 DIAGNOSIS — M8430XA Stress fracture, unspecified site, initial encounter for fracture: Secondary | ICD-10-CM

## 2019-07-07 NOTE — Progress Notes (Signed)
  Subjective:  Patient ID: Edward [name], male    DOB: 04-11-1975,  MRN: [id number]  Chief Complaint  Patient presents with  . Foot Pain    Right foot; Dorsal (submet 5) and plantar forefoot-submet 5; pt stated, "Is a runner (has not ran in 2-3 weeks); typically runs 10 miles a week; hurts when biking; feels sore"; x2+months    44 y.o. male presents with the above complaint. History confirmed with patient.   Objective:  Physical Exam: warm, good capillary refill, no trophic changes or ulcerative lesions, normal DP and PT pulses and normal sensory exam.  Right Foot: Pedal vision at the fourth and fifth metatarsals with tuning fork pain over the fourth metatarsal  Assessment:   1. Stress reaction of bone   2. Pain in right foot      Plan:  Patient was evaluated and treated and all questions answered.  Stress Reaction right foot, slight tailor's bunion -X-rays reviewed with patient -Discussed silicone offloading pad.   -Discussed gradual return to activity.  No follow-ups on file.

## 2019-07-09 ENCOUNTER — Other Ambulatory Visit: Payer: Self-pay | Admitting: Family

## 2019-08-13 ENCOUNTER — Encounter: Payer: Self-pay | Admitting: Podiatry

## 2019-08-16 ENCOUNTER — Ambulatory Visit: Payer: BC Managed Care – PPO | Admitting: Podiatry

## 2019-09-17 ENCOUNTER — Encounter: Payer: Self-pay | Admitting: Internal Medicine

## 2019-09-17 ENCOUNTER — Ambulatory Visit (INDEPENDENT_AMBULATORY_CARE_PROVIDER_SITE_OTHER): Payer: BC Managed Care – PPO | Admitting: Internal Medicine

## 2019-09-17 ENCOUNTER — Other Ambulatory Visit: Payer: Self-pay

## 2019-09-17 VITALS — BP 122/74 | HR 73 | Temp 98.8°F | Ht 69.0 in | Wt 178.0 lb

## 2019-09-17 DIAGNOSIS — R21 Rash and other nonspecific skin eruption: Secondary | ICD-10-CM | POA: Diagnosis not present

## 2019-09-17 MED ORDER — METHYLPREDNISOLONE ACETATE 40 MG/ML IJ SUSP
40.0000 mg | Freq: Once | INTRAMUSCULAR | Status: AC
  Administered 2019-09-17: 40 mg via INTRAMUSCULAR

## 2019-09-17 NOTE — Patient Instructions (Signed)
We have given you the steroid shot today and you can use benadryl or cortisone creams on the rash.

## 2019-09-18 DIAGNOSIS — R21 Rash and other nonspecific skin eruption: Secondary | ICD-10-CM | POA: Insufficient documentation

## 2019-09-18 NOTE — Progress Notes (Signed)
   Subjective:   Patient ID: Edward [name], male    DOB: [birth year], 44 y.o.   MRN: [id number]  HPI The patient is a 44 YO man coming in for concerns about exposure to poison ivy and rash. He has been exposed in the past and had severe reactions. This happened about 1 day ago and he is not terrible now but concerned it could worsen. Some itching and rash on legs. Denies fevers or chills or facial rash or swelling. He thought he was being careful but got some on his legs.   Review of Systems  Constitutional: Negative.   HENT: Negative.   Eyes: Negative.   Respiratory: Negative for cough, chest tightness and shortness of breath.   Cardiovascular: Negative for chest pain, palpitations and leg swelling.  Gastrointestinal: Negative for abdominal distention, abdominal pain, constipation, diarrhea, nausea and vomiting.  Musculoskeletal: Negative.   Skin: Positive for rash.  Neurological: Negative.   Psychiatric/Behavioral: Negative.     Objective:  Physical Exam Constitutional:      Appearance: He is well-developed.  HENT:     Head: Normocephalic and atraumatic.  Cardiovascular:     Rate and Rhythm: Normal rate and regular rhythm.  Pulmonary:     Effort: Pulmonary effort is normal. No respiratory distress.     Breath sounds: Normal breath sounds. No wheezing or rales.  Abdominal:     General: Bowel sounds are normal. There is no distension.     Palpations: Abdomen is soft.     Tenderness: There is no abdominal tenderness. There is no rebound.  Musculoskeletal:     Cervical back: Normal range of motion.  Skin:    General: Skin is warm and dry.     Findings: Rash present.     Comments: Red rash on bilateral shins consistent with early poison ivy dermatitis  Neurological:     Mental Status: He is alert and oriented to person, place, and time.     Coordination: Coordination normal.     Vitals:   09/17/19 1534  BP: 122/74  Pulse: 73  Temp: 98.8 F (37.1 C)  TempSrc: Oral    SpO2: 98%  Weight: 178 lb (80.7 kg)  Height: 5\' 9"  (1.753 m)    This visit occurred during the SARS-CoV-2 public health emergency.  Safety protocols were in place, including screening questions prior to the visit, additional usage of staff PPE, and extensive cleaning of exam room while observing appropriate contact time as indicated for disinfecting solutions.   Assessment & Plan:  Depo-medrol 40 mg IM given at visit

## 2019-09-18 NOTE — Assessment & Plan Note (Signed)
Depo-medrol 40 mg IM given at visit and encouraged to continue using otc creams. Advised to be careful of exposures and wearing protective clothing when working outdoors.

## 2019-12-23 ENCOUNTER — Encounter: Payer: BC Managed Care – PPO | Admitting: Family

## 2019-12-30 ENCOUNTER — Ambulatory Visit (INDEPENDENT_AMBULATORY_CARE_PROVIDER_SITE_OTHER): Payer: BC Managed Care – PPO | Admitting: Family

## 2019-12-30 ENCOUNTER — Encounter: Payer: Self-pay | Admitting: Family

## 2019-12-30 ENCOUNTER — Other Ambulatory Visit: Payer: Self-pay

## 2019-12-30 VITALS — BP 120/76 | HR 57 | Temp 97.9°F | Ht 69.0 in | Wt 175.8 lb

## 2019-12-30 DIAGNOSIS — E039 Hypothyroidism, unspecified: Secondary | ICD-10-CM | POA: Diagnosis not present

## 2019-12-30 DIAGNOSIS — Z13220 Encounter for screening for lipoid disorders: Secondary | ICD-10-CM | POA: Diagnosis not present

## 2019-12-30 DIAGNOSIS — Z0000 Encounter for general adult medical examination without abnormal findings: Secondary | ICD-10-CM

## 2019-12-30 DIAGNOSIS — E559 Vitamin D deficiency, unspecified: Secondary | ICD-10-CM | POA: Diagnosis not present

## 2019-12-30 DIAGNOSIS — G4700 Insomnia, unspecified: Secondary | ICD-10-CM

## 2019-12-30 DIAGNOSIS — Z23 Encounter for immunization: Secondary | ICD-10-CM | POA: Diagnosis not present

## 2019-12-30 DIAGNOSIS — Z125 Encounter for screening for malignant neoplasm of prostate: Secondary | ICD-10-CM

## 2019-12-30 LAB — CBC WITH DIFFERENTIAL/PLATELET
Basophils Absolute: 0 10*3/uL (ref 0.0–0.1)
Basophils Relative: 0.9 % (ref 0.0–3.0)
Eosinophils Absolute: 0.2 10*3/uL (ref 0.0–0.7)
Eosinophils Relative: 4.5 % (ref 0.0–5.0)
HCT: 42.5 % (ref 39.0–52.0)
Hemoglobin: 14.7 g/dL (ref 13.0–17.0)
Lymphocytes Relative: 24.7 % (ref 12.0–46.0)
Lymphs Abs: 1.2 10*3/uL (ref 0.7–4.0)
MCHC: 34.5 g/dL (ref 30.0–36.0)
MCV: 89.2 fl (ref 78.0–100.0)
Monocytes Absolute: 0.5 10*3/uL (ref 0.1–1.0)
Monocytes Relative: 9.7 % (ref 3.0–12.0)
Neutro Abs: 3 10*3/uL (ref 1.4–7.7)
Neutrophils Relative %: 60.2 % (ref 43.0–77.0)
Platelets: 136 10*3/uL — ABNORMAL LOW (ref 150.0–400.0)
RBC: 4.77 Mil/uL (ref 4.22–5.81)
RDW: 12.8 % (ref 11.5–15.5)
WBC: 5 10*3/uL (ref 4.0–10.5)

## 2019-12-30 LAB — COMPREHENSIVE METABOLIC PANEL
ALT: 17 U/L (ref 0–53)
AST: 17 U/L (ref 0–37)
Albumin: 4.3 g/dL (ref 3.5–5.2)
Alkaline Phosphatase: 59 U/L (ref 39–117)
BUN: 12 mg/dL (ref 6–23)
CO2: 31 mEq/L (ref 19–32)
Calcium: 8.9 mg/dL (ref 8.4–10.5)
Chloride: 104 mEq/L (ref 96–112)
Creatinine, Ser: 0.79 mg/dL (ref 0.40–1.50)
GFR: 107.51 mL/min (ref >60.00)
Glucose, Bld: 81 mg/dL (ref 70–99)
Potassium: 3.7 mEq/L (ref 3.5–5.1)
Sodium: 141 mEq/L (ref 135–145)
Total Bilirubin: 0.6 mg/dL (ref 0.2–1.2)
Total Protein: 6.5 g/dL (ref 6.0–8.3)

## 2019-12-30 LAB — LIPID PANEL
Cholesterol: 144 mg/dL (ref 0–200)
HDL: 38 mg/dL — ABNORMAL LOW (ref >39.00)
LDL Cholesterol: 67 mg/dL (ref 0–99)
NonHDL: 106.39
Total CHOL/HDL Ratio: 4
Triglycerides: 195 mg/dL — ABNORMAL HIGH (ref 0.0–149.0)
VLDL: 39 mg/dL (ref 0.0–40.0)

## 2019-12-30 LAB — VITAMIN D 25 HYDROXY (VIT D DEFICIENCY, FRACTURES): VITD: 14.36 ng/mL — ABNORMAL LOW (ref 30.00–100.00)

## 2019-12-30 LAB — TSH: TSH: 1.52 u[IU]/mL (ref 0.35–4.50)

## 2019-12-30 LAB — PSA: PSA: 0.72 ng/mL (ref 0.10–4.00)

## 2019-12-30 MED ORDER — TRAZODONE HCL 50 MG PO TABS: 25.0000 mg | ORAL_TABLET | Freq: Every evening | ORAL | 3 refills | Status: DC | PRN

## 2019-12-30 NOTE — Patient Instructions (Signed)
Please check with your insurance about coverage for screening colonoscopy before age 45; please check on what your out of pocket costs.

## 2019-12-30 NOTE — Progress Notes (Signed)
Edward [name] is a 44 y.o. male with the following history as recorded in [employer]:  Patient Active Problem List   Diagnosis Date Noted  . Rash 09/18/2019  . Nonallopathic lesion of thoracic region 11/25/2018  . Nonallopathic lesion of sacral region 11/25/2018  . Nonallopathic lesion of lumbosacral region 11/25/2018  . Nonallopathic lesion of rib cage 11/25/2018  . Nonallopathic lesion of cervical region 11/25/2018  . Trigger point of left shoulder region 08/29/2018  . Patellofemoral arthralgia of right knee 07/25/2018  . Scapular dyskinesis 07/25/2018  . Family history of prostate cancer in father 07/16/2018    Current Outpatient Medications  Medication Sig Dispense Refill  . cholecalciferol (VITAMIN D3) 25 MCG (1000 UT) tablet Take 1,000 Units by mouth 2 (two) times a day.    . levothyroxine (SYNTHROID) 75 MCG tablet TAKE 1 TABLET(75 MCG) BY MOUTH DAILY 90 tablet 1  . traZODone (DESYREL) 50 MG tablet Take 0.5-1 tablets (25-50 mg total) by mouth at bedtime as needed for sleep. 30 tablet 3   No current facility-administered medications for this visit.    Allergies: Patient has no known allergies.  History reviewed. No pertinent past medical history.  History reviewed. No pertinent surgical history.  Family History  Problem Relation Age of Onset  . Depression Mother   . Asthma Father   . Cancer Father   . CAD Father   . Hyperlipidemia Father   . Hypertension Father     Social History   Tobacco Use  . Smoking status: Former Research scientist (life sciences)  . Smokeless tobacco: Never Used  Substance Use Topics  . Alcohol use: Yes    Comment: social    Subjective:  Presents for yearly CPE; needs to get yearly labs updated; is having difficulty sleeping- wondering if thyroid medication needs to be adjusted;   Review of Systems  Constitutional: Negative.   HENT: Negative.   Eyes: Negative.   Respiratory: Negative.   Cardiovascular: Negative.   Gastrointestinal: Positive for constipation.   Genitourinary: Negative.   Musculoskeletal: Negative.   Skin: Negative.   Neurological: Negative.   Endo/Heme/Allergies: Negative.   Psychiatric/Behavioral: The patient has insomnia.      Objective:  Vitals:   12/30/19 0920  BP: 120/76  Pulse: (!) 57  Temp: 97.9 F (36.6 C)  TempSrc: Oral  SpO2: 97%  Weight: 175 lb 12.8 oz (79.7 kg)  Height: '5\' 9"'  (1.753 m)    General: Well developed, well nourished, in no acute distress  Skin : Warm and dry.  Head: Normocephalic and atraumatic  Eyes: Sclera and conjunctiva clear; pupils round and reactive to light; extraocular movements intact  Ears: External normal; canals clear; tympanic membranes normal  Oropharynx: Pink, supple. No suspicious lesions  Neck: Supple without thyromegaly, adenopathy  Lungs: Respirations unlabored; clear to auscultation bilaterally without wheeze, rales, rhonchi  CVS exam: normal rate and regular rhythm.  Abdomen: Soft; nontender; nondistended; normoactive bowel sounds; no masses or hepatosplenomegaly  Musculoskeletal: No deformities; no active joint inflammation  Extremities: No edema, cyanosis, clubbing  Vessels: Symmetric bilaterally  Neurologic: Alert and oriented; speech intact; face symmetrical; moves all extremities well; CNII-XII intact without focal deficit  Assessment:  1. PE (physical exam), annual   2. Lipid screening   3. Hypothyroidism, unspecified type   4. Prostate cancer screening   5. Vitamin D deficiency   6. Needs flu shot   7. Insomnia, unspecified type     Plan:  Age appropriate preventive healthcare needs addressed; encouraged regular eye doctor and dental  exams; encouraged regular exercise; will update labs and refills as needed today; follow-up to be determined; Trial of Trazodone- use as directed; follow-up with response; may need alternate medication;  This visit occurred during the SARS-CoV-2 public health emergency.  Safety protocols were in place, including screening  questions prior to the visit, additional usage of staff PPE, and extensive cleaning of exam room while observing appropriate contact time as indicated for disinfecting solutions.     No follow-ups on file.  Orders Placed This Encounter  Procedures  . Flu Vaccine QUAD 36+ mos IM  . CBC with Differential/Platelet    Standing Status:   Future    Number of Occurrences:   1    Standing Expiration Date:   12/29/2020  . Comp Met (CMET)    Standing Status:   Future    Number of Occurrences:   1    Standing Expiration Date:   12/29/2020  . Lipid panel    Standing Status:   Future    Number of Occurrences:   1    Standing Expiration Date:   12/29/2020  . TSH    Standing Status:   Future    Number of Occurrences:   1    Standing Expiration Date:   12/29/2020  . PSA    Standing Status:   Future    Number of Occurrences:   1    Standing Expiration Date:   12/29/2020  . Vitamin D (25 hydroxy)    Standing Status:   Future    Number of Occurrences:   1    Standing Expiration Date:   12/29/2020    Requested Prescriptions   Signed Prescriptions Disp Refills  . traZODone (DESYREL) 50 MG tablet 30 tablet 3    Sig: Take 0.5-1 tablets (25-50 mg total) by mouth at bedtime as needed for sleep.

## 2019-12-31 ENCOUNTER — Other Ambulatory Visit: Payer: Self-pay | Admitting: Family

## 2019-12-31 MED ORDER — VITAMIN D (ERGOCALCIFEROL) 1.25 MG (50000 UNIT) PO CAPS: 50000.0000 [IU] | ORAL_CAPSULE | ORAL | 0 refills | Status: AC

## 2020-01-07 ENCOUNTER — Other Ambulatory Visit: Payer: Self-pay | Admitting: Family

## 2020-01-22 ENCOUNTER — Encounter: Payer: Self-pay | Admitting: Family

## 2020-02-03 NOTE — Progress Notes (Signed)
[facility] [place] Phone: [phone] Subjective:   Fontaine No, am serving as a scribe for Dr. Hulan Saas. This visit occurred during the SARS-CoV-2 public health emergency.  Safety protocols were in place, including screening questions prior to the visit, additional usage of staff PPE, and extensive cleaning of exam room while observing appropriate contact time as indicated for disinfecting solutions.  I'm seeing this patient by the request  of:  Marrian Salvage, FNP  CC: Neck and back pain follow-up  [id number]  London Tarnowski is a 44 y.o. male coming in with complaint of back and neck pain. OMT 12/31/2018. Patient last seen for thoracic and cervical spine pain. Patient states that he had a flare with pain between the scapula. Used IBU, TENS, yoga. Pain has improved since then.  Patient though has responded to manipulation in the past.  Patient is going out of the state for the next week he would like manipulation before he leaves.  Trying to avoid a lot of medications if possible.  Patient is also complaining of right knee pain.  We had seen patient long time ago for this knee pain.  Was running regularly but no longer is secondary to the discomfort.  Patient is having increasing instability of the knee.  Patient's previous ultrasounds did show some mild narrowing of the patellofemoral space noted.  Medications patient has been prescribed:   Taking:         Reviewed prior external information including notes and imaging from previsou exam, outside providers and external EMR if available.   As well as notes that were available from care everywhere and other healthcare systems.  Past medical history, social, surgical and family history all reviewed in electronic medical record.  No pertanent information unless stated regarding to the chief complaint.   No past medical history on file.  No Known  Allergies   Review of Systems:  No headache, visual changes, nausea, vomiting, diarrhea, constipation, dizziness, abdominal pain, skin rash, fevers, chills, night sweats, weight loss, swollen lymph nodes, body aches, joint swelling, chest pain, shortness of breath, mood changes. POSITIVE muscle aches  Objective  There were no vitals taken for this visit.   General: No apparent distress alert and oriented x3 mood and affect normal, dressed appropriately.  HEENT: Pupils equal, extraocular movements intact  Respiratory: Patient's speak in full sentences and does not appear short of breath  Cardiovascular: No lower extremity edema, non tender, no erythema  Back -low back exam does show some mild tightness noted in the sacroiliac joint.  Tightness with FABER test bilaterally.  Negative straight leg test.  Tightness noted in the parascapular region as well left greater than right.  Does have tightness with left-sided sidebending and rotation of the neck.  Negative Spurling's.  Osteopathic findings  C6 flexed rotated and side bent left T3 extended rotated and side bent left inhaled rib T9 extended rotated and side bent left L2 flexed rotated and side bent right Sacrum right on right       Assessment and Plan:  Scapular dyskinesis Chronic problem with worsening symptoms.  No significant trigger points noted today.  We discussed different medications and patient declined.  Wants to just try the over-the-counter medicines.  We discussed the potential of the prednisone which she declined.  We discussed doing the exercises more regularly.  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  Follow-up  with me again in 3 to 4 weeks    Nonallopathic problems  Decision today to treat with OMT was based on Physical Exam  After verbal consent patient was treated with HVLA, ME, FPR techniques in cervical, rib, thoracic, lumbar, and sacral  areas  Patient  tolerated the procedure well with improvement in symptoms  Patient given exercises, stretches and lifestyle modifications  See medications in patient instructions if given  Patient will follow up in 4-8 weeks     The above documentation has been reviewed and is accurate and complete Lyndal Pulley, DO       Note: This dictation was prepared with Dragon dictation along with smaller phrase technology. Any transcriptional errors that result from this process are unintentional.

## 2020-02-04 ENCOUNTER — Encounter: Payer: Self-pay | Admitting: Family Medicine

## 2020-02-04 ENCOUNTER — Ambulatory Visit (INDEPENDENT_AMBULATORY_CARE_PROVIDER_SITE_OTHER): Payer: BC Managed Care – PPO

## 2020-02-04 ENCOUNTER — Ambulatory Visit: Payer: BC Managed Care – PPO | Admitting: Family Medicine

## 2020-02-04 ENCOUNTER — Other Ambulatory Visit: Payer: Self-pay

## 2020-02-04 VITALS — BP 108/72 | HR 75 | Ht 69.0 in | Wt 176.0 lb

## 2020-02-04 DIAGNOSIS — M25561 Pain in right knee: Secondary | ICD-10-CM

## 2020-02-04 DIAGNOSIS — G8929 Other chronic pain: Secondary | ICD-10-CM

## 2020-02-04 DIAGNOSIS — G2589 Other specified extrapyramidal and movement disorders: Secondary | ICD-10-CM

## 2020-02-04 DIAGNOSIS — M999 Biomechanical lesion, unspecified: Secondary | ICD-10-CM | POA: Diagnosis not present

## 2020-02-04 NOTE — Assessment & Plan Note (Signed)
Patient is having more of a patellofemoral arthritis with some mild lateral tracking noted.  We will get x-rays ordered today.  If any loose body will consider with patient having some mechanical symptoms of locking we may want to consider the possibility of advanced imaging.  Discussed icing regimen.  Patient will follow up with me again in 3 to 4 weeks

## 2020-02-04 NOTE — Assessment & Plan Note (Signed)
Chronic problem with worsening symptoms.  No significant trigger points noted today.  We discussed different medications and patient declined.  Wants to just try the over-the-counter medicines.  We discussed the potential of the prednisone which she declined.  We discussed doing the exercises more regularly.  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  Follow-up with me again in 3 to 4 weeks

## 2020-02-04 NOTE — Patient Instructions (Signed)
Right knee xray  Depending on xray and how you do the next week, consider MRI Back responded well to OMT Continue exercises Have an appt in 4-5 weeks, if you do well lets move 2-3 months Back in Balance massage therapy

## 2020-02-24 ENCOUNTER — Ambulatory Visit: Payer: BC Managed Care – PPO | Admitting: Family Medicine

## 2020-03-03 ENCOUNTER — Ambulatory Visit: Payer: BC Managed Care – PPO | Admitting: Family Medicine

## 2020-04-10 ENCOUNTER — Other Ambulatory Visit: Payer: Self-pay | Admitting: Family

## 2020-04-21 ENCOUNTER — Other Ambulatory Visit: Payer: Self-pay

## 2020-04-21 ENCOUNTER — Encounter: Payer: Self-pay | Admitting: Family

## 2020-04-21 ENCOUNTER — Encounter: Payer: Self-pay | Admitting: Family Medicine

## 2020-04-21 DIAGNOSIS — M25561 Pain in right knee: Secondary | ICD-10-CM

## 2020-05-07 ENCOUNTER — Ambulatory Visit
Admission: RE | Admit: 2020-05-07 | Discharge: 2020-05-07 | Disposition: A | Discharge status: Home / Self Care | Payer: BC Managed Care – PPO | Source: Ambulatory Visit | Attending: Family Medicine | Admitting: Family Medicine

## 2020-05-07 DIAGNOSIS — M25561 Pain in right knee: Secondary | ICD-10-CM

## 2020-05-13 ENCOUNTER — Encounter: Payer: Self-pay | Admitting: Family Medicine

## 2020-06-05 NOTE — Progress Notes (Signed)
[name] [address] [place] Edward [name]: [phone] Subjective:   I Edward [name] am serving as a Education administrator for Dr. Hulan Saas.  This visit occurred during the SARS-CoV-2 public health emergency.  Safety protocols were in place, including screening questions prior to the visit, additional usage of staff PPE, and extensive cleaning of exam room while observing appropriate contact time as indicated for disinfecting solutions.   I'm seeing this patient by the request  of:  Marrian Salvage, FNP  CC: Knee pain, back pain follow-up  [id number]   02/04/2020 Patient is having more of a patellofemoral arthritis with some mild lateral tracking noted.  We will get x-rays ordered today.  If any loose body will consider with patient having some mechanical symptoms of locking we may want to consider the possibility of advanced imaging.  Discussed icing regimen.  Patient will follow up with me again in 3 to 4 weeks  Update 06/08/2020 Edward [name] is a 45 y.o. male coming in with complaint of R knee pain. Patient states his knee is sore. Injury is 45 years old. Recent MRI.   MRI  R knee 05/07/2020  IMPRESSION: 1. Intact ligamentous structures and no acute bony findings. 2. No meniscal tears. 3. 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 No delamination injury or osteochondral lesion. 4. Mild proximal patellar tendinopathy. 5. No joint effusion or Baker's cyst.    No past medical history on file. No past surgical history on file. Social History   Socioeconomic History  . Marital status: Single    Spouse name: Not on file  . Number of children: Not on file  . Years of education: Not on file  . Highest education level: Not on file  Occupational History  . Not on file  Tobacco Use  . Smoking status: Former Research scientist (life sciences)  . Smokeless tobacco: Never Used  Substance and Sexual Activity  . Alcohol use: Yes    Comment:  social  . Drug use: Not on file  . Sexual activity: Not on file  Other Topics Concern  . Not on file  Social History Narrative  . Not on file   Social Determinants of Health   Financial Resource Strain: Not on file  Food Insecurity: Not on file  Transportation Needs: Not on file  Physical Activity: Not on file  Stress: Not on file  Social Connections: Not on file   No Known Allergies Family History  Problem Relation Age of Onset  . Depression Mother   . Asthma Father   . Cancer Father   . CAD Father   . Hyperlipidemia Father   . Hypertension Father     Current Outpatient Medications (Endocrine & Metabolic):  .  levothyroxine (SYNTHROID) 75 MCG tablet, TAKE 1 TABLET(75 MCG) BY MOUTH DAILY      Current Outpatient Medications (Other):  .  cholecalciferol (VITAMIN D3) 25 MCG (1000 UT) tablet, Take 1,000 Units by mouth 2 (two) times a day. .  traZODone (DESYREL) 50 MG tablet, Take 0.5-1 tablets (25-50 mg total) by mouth at bedtime as needed for sleep.   Reviewed prior external information including notes and imaging from  primary care provider As well as notes that were available from care everywhere and other healthcare systems.  Past medical history, social, surgical and family history all reviewed in electronic medical record.  No pertanent information unless stated regarding to the chief complaint.   Review of Systems:  No  headache, visual changes, nausea, vomiting, diarrhea, constipation, dizziness, abdominal pain, skin rash, fevers, chills, night sweats, weight loss, swollen lymph nodes, body aches, joint swelling, chest pain, shortness of breath, mood changes. POSITIVE muscle aches  Objective  Blood pressure 110/80, pulse 66, height 5\' 9"  (1.753 m), weight 175 lb (79.4 kg), SpO2 97 %.   General: No apparent distress alert and oriented x3 mood and affect normal, dressed appropriately.  HEENT: Pupils equal, extraocular movements intact  Respiratory: Patient's  speak in full sentences and does not appear short of breath  Cardiovascular: No lower extremity edema, non tender, no erythema  Gait normal with good balance and coordination.  MSK: Right knee exam shows the patient does have more tenderness in the more of the quadricep.  Still lateral tracking of the patella noted.  Mild positive grind test noted.  No significant instability of the knee noted.  Low back exam does have some tenderness to palpation more in the thoracolumbar juncture.  Seems to be more on the right side.  Scapular dyskinesis still noted on the left side.  Negative straight leg test.  5-5 strength of the extremities.  Osteopathic findings C4 flexed rotated and side bent left C6 flexed rotated and side bent left T4 extended rotated and side bent left inhaled third rib L2 flexed rotated and side bent right Sacrum right on right    Impression and Recommendations:     The above documentation has been reviewed and is accurate and complete Edward Pulley, DO

## 2020-06-08 ENCOUNTER — Ambulatory Visit: Payer: BC Managed Care – PPO | Admitting: Family Medicine

## 2020-06-08 ENCOUNTER — Other Ambulatory Visit: Payer: Self-pay

## 2020-06-08 ENCOUNTER — Encounter: Payer: Self-pay | Admitting: Family Medicine

## 2020-06-08 VITALS — BP 110/80 | HR 66 | Ht 69.0 in | Wt 175.0 lb

## 2020-06-08 DIAGNOSIS — M999 Biomechanical lesion, unspecified: Secondary | ICD-10-CM

## 2020-06-08 DIAGNOSIS — M9902 Segmental and somatic dysfunction of thoracic region: Secondary | ICD-10-CM | POA: Diagnosis not present

## 2020-06-08 DIAGNOSIS — M25561 Pain in right knee: Secondary | ICD-10-CM | POA: Diagnosis not present

## 2020-06-08 DIAGNOSIS — M9903 Segmental and somatic dysfunction of lumbar region: Secondary | ICD-10-CM

## 2020-06-08 DIAGNOSIS — M9904 Segmental and somatic dysfunction of sacral region: Secondary | ICD-10-CM

## 2020-06-08 DIAGNOSIS — M9901 Segmental and somatic dysfunction of cervical region: Secondary | ICD-10-CM

## 2020-06-08 DIAGNOSIS — G2589 Other specified extrapyramidal and movement disorders: Secondary | ICD-10-CM | POA: Diagnosis not present

## 2020-06-08 DIAGNOSIS — M9908 Segmental and somatic dysfunction of rib cage: Secondary | ICD-10-CM

## 2020-06-08 NOTE — Assessment & Plan Note (Signed)
Patient does have some disordered noted of the patellofemoral joint causing patient to have focal deep chondral fissures of the patella apex.  This also has some more of a patella tendinopathy.  Discussed with patient about a patella strap, discussed home exercises, patient is wanting to consider the possibility of viscosupplementation with this.  Patient could be a candidate for this and we will see if we get approval.  We also discussed the possibility of PRP.  Patient wants to avoid the steroid injections at this point.  Discussed icing regimen and home exercises.  Follow-up with me again for weeks

## 2020-06-08 NOTE — Patient Instructions (Addendum)
Good to see you Gel approval Read about PRP and the gel and see which one sounds better We will call you on for the appointment on May 9th Get a patellar strap and wear at the bottom part of the kneecap with working out Make an appointment for 4-6 weeks

## 2020-06-08 NOTE — Assessment & Plan Note (Signed)
Continued scapular dyskinesis.  Patient does respond fairly well to osteopathic manipulation.  Discussed posture and ergonomics.  Discussed with patient about working habits we discussed with patient to take over-the-counter medications when necessary.  Total time with patient today discussing the MRI, and different treatment options, as well as osteopathic manipulation today: Greater than 33 minutes

## 2020-06-08 NOTE — Assessment & Plan Note (Signed)

## 2020-07-08 ENCOUNTER — Encounter: Payer: Self-pay | Admitting: Family Medicine

## 2020-07-16 ENCOUNTER — Ambulatory Visit: Payer: BC Managed Care – PPO | Admitting: Family Medicine

## 2020-07-16 ENCOUNTER — Other Ambulatory Visit: Payer: Self-pay | Admitting: Family

## 2020-08-07 NOTE — Progress Notes (Signed)
[facility] [place] [facility] [place] Phone: [phone] Subjective:   Fontaine No, am serving as a scribe for Dr. Hulan Saas. This visit occurred during the SARS-CoV-2 public health emergency.  Safety protocols were in place, including screening questions prior to the visit, additional usage of staff PPE, and extensive cleaning of exam room while observing appropriate contact time as indicated for disinfecting solutions.   I'm seeing this patient by the request  of:  Marrian Salvage, FNP  CC: Right knee pain follow-up  [id number]  Edward [name] is a 45 y.o. male coming in with complaint of R knee pain. Here for PRP today. Patient states that he has not felt any improvement in his knee since last visit. Has not run in months due to pain in knee. Does bike and this exercise is fine.     Right knee MRI showed overall the patient did have some focal deep chondral fissures at the patellar apex and mild proximal patellar tendinopathy but otherwise unremarkable.  No past medical history on file. No past surgical history on file. Social History   Socioeconomic History   Marital status: Single    Spouse name: Not on file   Number of children: Not on file   Years of education: Not on file   Highest education level: Not on file  Occupational History   Not on file  Tobacco Use   Smoking status: Former    Pack years: 0.00   Smokeless tobacco: Never  Substance and Sexual Activity   Alcohol use: Yes    Comment: social   Drug use: Not on file   Sexual activity: Not on file  Other Topics Concern   Not on file  Social History Narrative   Not on file   Social Determinants of Health   Financial Resource Strain: Not on file  Food Insecurity: Not on file  Transportation Needs: Not on file  Physical Activity: Not on file  Stress: Not on file  Social Connections: Not on file   No Known Allergies Family History  Problem  Relation Age of Onset   Depression Mother    Asthma Father    Cancer Father    CAD Father    Hyperlipidemia Father    Hypertension Father     Current Outpatient Medications (Endocrine & Metabolic):    levothyroxine (SYNTHROID) 75 MCG tablet, TAKE 1 TABLET(75 MCG) BY MOUTH DAILY      Current Outpatient Medications (Other):    cholecalciferol (VITAMIN D3) 25 MCG (1000 UT) tablet, Take 1,000 Units by mouth 2 (two) times a day.   traZODone (DESYREL) 50 MG tablet, TAKE 1/2 TO 1 TABLET(25 TO 50 MG) BY MOUTH AT BEDTIME AS NEEDED FOR SLEEP   Reviewed prior external information including notes and imaging from  primary care provider As well as notes that were available from care everywhere and other healthcare systems.  Past medical history, social, surgical and family history all reviewed in electronic medical record.  No pertanent information unless stated regarding to the chief complaint.   Review of Systems:  No headache, visual changes, nausea, vomiting, diarrhea, constipation, dizziness, abdominal pain, skin rash, fevers, chills, night sweats, weight loss, swollen lymph nodes, body aches, joint swelling, chest pain, shortness of breath, mood changes. POSITIVE muscle aches  Objective  Blood pressure 124/82, pulse 77, height 5\' 9"  (1.753 m), weight 174 lb (78.9 kg), SpO2 99 %.   General: No apparent distress alert and oriented  x3 mood and affect normal, dressed appropriately.  HEENT: Pupils equal, extraocular movements intact  Respiratory: Patient's speak in full sentences and does not appear short of breath  Cardiovascular: No lower extremity edema, non tender, no erythema   MSK: Right knee continues to have some discomfort at the patellofemoral joint.  Very minimal discomfort noted at the patella tendon itself.  Patient does have crepitus minorly noted.  No significant swelling well.  After informed written and verbal consent, patient was seated on exam table. Right knee was  prepped with alcohol swab and utilizing anterolateral approach, patient's right knee space was injected with 2 cc of 0.5% Marcaine and 4 cc of 3 centrifuge PRP leukocyte poor.. Patient tolerated the procedure but did have significant fullness noted of the right knee after the injection and did have some mild antalgic gait.  Patient had no pain in the calf.  No significant swelling 5 minutes after the injection.  Patient was able to ambulate on his own strength.     Impression and Recommendations:     The above documentation has been reviewed and is accurate and complete Lyndal Pulley, DO

## 2020-08-10 ENCOUNTER — Other Ambulatory Visit: Payer: Self-pay

## 2020-08-10 ENCOUNTER — Ambulatory Visit (INDEPENDENT_AMBULATORY_CARE_PROVIDER_SITE_OTHER): Payer: Self-pay | Admitting: Family Medicine

## 2020-08-10 ENCOUNTER — Encounter: Payer: Self-pay | Admitting: Family Medicine

## 2020-08-10 DIAGNOSIS — M25561 Pain in right knee: Secondary | ICD-10-CM

## 2020-08-10 NOTE — Patient Instructions (Signed)
No ice or IBU for 3 days Can you Tylenol for pain relief See me again in 5-6 weeks

## 2020-08-10 NOTE — Assessment & Plan Note (Signed)
Patient had PRP done.  Tolerated the procedure well.  Discussed icing regimen and home exercises.  Discussed which activities to do which wants to avoid.  Discussed potential post PRP pain and trying to avoid anti-inflammatories initially.  Patient knows to call us if any significant difficulty.  Patient will follow up again with 5 weeks after the post PRP progression exercises.

## 2020-09-18 NOTE — Progress Notes (Signed)
[place] Phone: [phone] Subjective:   Fontaine No, am serving as a scribe for Dr. Hulan Saas. This visit occurred during the SARS-CoV-2 public health emergency.  Safety protocols were in place, including screening questions prior to the visit, additional usage of staff PPE, and extensive cleaning of exam room while observing appropriate contact time as indicated for disinfecting solutions.   I'm seeing this patient by the request  of:  Marrian Salvage, FNP  CC: Knee pain follow-up  [id number]  08/10/2020 Patient had PRP done.  Tolerated the procedure well.  Discussed icing regimen and home exercises.  Discussed which activities to do which wants to avoid.  Discussed potential post PRP pain and trying to avoid anti-inflammatories initially.  Patient knows to call us if any significant difficulty.  Patient will follow up again with 5 weeks after the post PRP progression exercises.  Update 09/18/2020 Edward [name] is a 45 y.o. male coming in with complaint of R knee pain.  Patient was seen in in June 2022 given the PRP injection.  Patient is now 6 weeks out.  Patient states that he is feeling worse than last visit. Has only ridden the bike once for 30 minutes. Does not feel that PRP helped.       No past medical history on file. No past surgical history on file. Social History   Socioeconomic History   Marital status: Single    Spouse name: Not on file   Number of children: Not on file   Years of education: Not on file   Highest education level: Not on file  Occupational History   Not on file  Tobacco Use   Smoking status: Former   Smokeless tobacco: Never  Substance and Sexual Activity   Alcohol use: Yes    Comment: social   Drug use: Not on file   Sexual activity: Not on file  Other Topics Concern   Not on file  Social History Narrative   Not on file   Social Determinants of  Health   Financial Resource Strain: Not on file  Food Insecurity: Not on file  Transportation Needs: Not on file  Physical Activity: Not on file  Stress: Not on file  Social Connections: Not on file   No Known Allergies Family History  Problem Relation Age of Onset   Depression Mother    Asthma Father    Cancer Father    CAD Father    Hyperlipidemia Father    Hypertension Father     Current Outpatient Medications (Endocrine & Metabolic):    levothyroxine (SYNTHROID) 75 MCG tablet, TAKE 1 TABLET(75 MCG) BY MOUTH DAILY      Current Outpatient Medications (Other):    cholecalciferol (VITAMIN D3) 25 MCG (1000 UT) tablet, Take 1,000 Units by mouth 2 (two) times a day.   traZODone (DESYREL) 50 MG tablet, TAKE 1/2 TO 1 TABLET(25 TO 50 MG) BY MOUTH AT BEDTIME AS NEEDED FOR SLEEP   Reviewed prior external information including notes and imaging from  primary care provider As well as notes that were available from care everywhere and other healthcare systems.  Past medical history, social, surgical and family history all reviewed in electronic medical record.  No pertanent information unless stated regarding to the chief complaint.   Review of Systems:  No headache, visual changes, nausea, vomiting, diarrhea, constipation, dizziness, abdominal pain, skin rash, fevers, chills, night sweats, weight loss, swollen lymph nodes,  body aches, joint swelling, chest pain, shortness of breath, mood changes. POSITIVE muscle aches  Objective  Blood pressure 110/84, pulse 68, height '5\' 9"'$  (1.753 m), weight 171 lb (77.6 kg), SpO2 97 %.   General: No apparent distress alert and oriented x3 mood and affect normal, dressed appropriately.  HEENT: Pupils equal, extraocular movements intact  Respiratory: Patient's speak in full sentences and does not appear short of breath  Cardiovascular: No lower extremity edema, non tender, no erythema  Gait normal with good balance and coordination.  MSK:    Left knee exam shows the patient does have some mild tenderness to palpation still over the apex of the patella.  Patient does have a mild positive patellar grind test.  No improvement following the VMO.   Limited muscular skeletal ultrasound was performed and interpreted by Hulan Saas, M  Limited ultrasound of patient's left knee shows that patient does have no significant swelling noted today.  Patient does not have any significant narrowing of the joint spaces.  But can be appreciated of the medial and lateral meniscus appears to be unremarkable.   Impression and Recommendations:     The above documentation has been reviewed and is accurate and complete Edward Pulley, DO

## 2020-09-21 ENCOUNTER — Ambulatory Visit: Payer: Self-pay

## 2020-09-21 ENCOUNTER — Encounter: Payer: Self-pay | Admitting: Family Medicine

## 2020-09-21 ENCOUNTER — Ambulatory Visit: Payer: BC Managed Care – PPO | Admitting: Family Medicine

## 2020-09-21 ENCOUNTER — Other Ambulatory Visit: Payer: Self-pay

## 2020-09-21 VITALS — BP 110/84 | HR 68 | Ht 69.0 in | Wt 171.0 lb

## 2020-09-21 DIAGNOSIS — G2589 Other specified extrapyramidal and movement disorders: Secondary | ICD-10-CM

## 2020-09-21 DIAGNOSIS — M25561 Pain in right knee: Secondary | ICD-10-CM

## 2020-09-21 NOTE — Patient Instructions (Addendum)
Good to see you  Physical therapy sent to [facility] to increase activity If you notice any inflammation can call us or try anti inflammatories Over all thing look good  See me again in 6-8 weeks

## 2020-09-21 NOTE — Assessment & Plan Note (Signed)
Patient did not respond extremely well to continue PRP but I do not see any significant swelling noted at the moment.  Patient continues to have pain more at the apex of the patella that is consistent with patient's MRI findings.  I do feel that patient should try formal physical therapy with no significant swelling noted.  Worsening pain only need to consider the possibility of orthopedic surgery.  Patient is in agreement with the plan.

## 2020-09-21 NOTE — Assessment & Plan Note (Signed)
Patient continues to have some difficulty and will refer him to physical therapy as well.  Patient did do a lot of moving and was not able to do his exercises.

## 2020-09-22 ENCOUNTER — Ambulatory Visit: Payer: BC Managed Care – PPO | Admitting: Internal Medicine

## 2020-09-22 ENCOUNTER — Encounter: Payer: Self-pay | Admitting: Internal Medicine

## 2020-09-22 VITALS — BP 122/80 | HR 57 | Temp 97.9°F | Resp 18 | Ht 69.0 in | Wt 171.0 lb

## 2020-09-22 DIAGNOSIS — G4700 Insomnia, unspecified: Secondary | ICD-10-CM | POA: Insufficient documentation

## 2020-09-22 DIAGNOSIS — F5101 Primary insomnia: Secondary | ICD-10-CM

## 2020-09-22 DIAGNOSIS — C20 Malignant neoplasm of rectum: Secondary | ICD-10-CM | POA: Insufficient documentation

## 2020-09-22 DIAGNOSIS — R194 Change in bowel habit: Secondary | ICD-10-CM | POA: Diagnosis not present

## 2020-09-22 DIAGNOSIS — Z1211 Encounter for screening for malignant neoplasm of colon: Secondary | ICD-10-CM | POA: Diagnosis not present

## 2020-09-22 DIAGNOSIS — Z85048 Personal history of other malignant neoplasm of rectum, rectosigmoid junction, and anus: Secondary | ICD-10-CM | POA: Insufficient documentation

## 2020-09-22 DIAGNOSIS — E039 Hypothyroidism, unspecified: Secondary | ICD-10-CM | POA: Insufficient documentation

## 2020-09-22 MED ORDER — LEVOTHYROXINE SODIUM 75 MCG PO TABS: 75.0000 ug | ORAL_TABLET | Freq: Every day | ORAL | 3 refills | Status: DC

## 2020-09-22 MED ORDER — TRAZODONE HCL 50 MG PO TABS: ORAL_TABLET | ORAL | 2 refills | Status: DC

## 2020-09-22 NOTE — Assessment & Plan Note (Addendum)
For many years has had 5 BM per day and no blood in this and without pain. Some straining and feels it takes more time than average to go to the bathroom. Due for colon cancer screening referral to GI done. Is vegetarian for 20+ years. Advised to try metamucil daily to see if this helps reduce urgency and number of BMs. Start food diary to track if there are food triggers. He has given up dairy products without improvement in symptoms.

## 2020-09-22 NOTE — Assessment & Plan Note (Signed)
Takes 1/2 pill trazodone at night time. Having difficulty with fill pattern at pharmacy so adjusted to #45 for 3 month supply with note to pharmacy.

## 2020-09-22 NOTE — Assessment & Plan Note (Signed)
Taking synthroid 75 mcg daily and recent TSH within normal limits. Will plan to recheck at physical in fall. Refilled synthroid today.

## 2020-09-22 NOTE — Progress Notes (Signed)
   Subjective:   Patient ID: Edward [name], male    DOB: 03/06/1975, 45 y.o.   MRN: [id number]  HPI The patient is a 45 YO man coming in for digestive issues which have been going on for awhile but worse recently. Some difficulty to have BM which are then soft. Sometimes he has had gas and small amount of BM passed. Some hemorrhoids which are not new. No history or family history of colon cancer. No prior colonoscopy. Also wants to transition care as his provider has left review of other medical conditions.   Review of Systems  Constitutional: Negative.   HENT: Negative.    Eyes: Negative.   Respiratory:  Negative for cough, chest tightness and shortness of breath.   Cardiovascular:  Negative for chest pain, palpitations and leg swelling.  Gastrointestinal:  Positive for diarrhea. Negative for abdominal distention, abdominal pain, anal bleeding, blood in stool, constipation, nausea and vomiting.       Hemorrhoids, bowel urgency  Musculoskeletal: Negative.   Skin: Negative.   Neurological: Negative.   Psychiatric/Behavioral: Negative.     Objective:  Physical Exam Constitutional:      Appearance: He is well-developed.  HENT:     Head: Normocephalic and atraumatic.  Cardiovascular:     Rate and Rhythm: Normal rate and regular rhythm.  Pulmonary:     Effort: Pulmonary effort is normal. No respiratory distress.     Breath sounds: Normal breath sounds. No wheezing or rales.  Abdominal:     General: Bowel sounds are normal. There is no distension.     Palpations: Abdomen is soft.     Tenderness: There is no abdominal tenderness. There is no rebound.  Musculoskeletal:     Cervical back: Normal range of motion.  Skin:    General: Skin is warm and dry.  Neurological:     Mental Status: He is alert and oriented to person, place, and time.     Coordination: Coordination normal.    Vitals:   09/22/20 0909  BP: 122/80  Pulse: (!) 57  Resp: 18  Temp: 97.9 F (36.6 C)  TempSrc:  Oral  SpO2: 98%  Weight: 171 lb (77.6 kg)  Height: '5\' 9"'$  (1.753 m)    This visit occurred during the SARS-CoV-2 public health emergency.  Safety protocols were in place, including screening questions prior to the visit, additional usage of staff PPE, and extensive cleaning of exam room while observing appropriate contact time as indicated for disinfecting solutions.   Assessment & Plan:

## 2020-09-22 NOTE — Patient Instructions (Signed)
Try benefiber or metamucil to see if this can help with more control.

## 2020-09-29 ENCOUNTER — Ambulatory Visit: Payer: BC Managed Care – PPO | Admitting: Physical Therapy

## 2020-10-01 ENCOUNTER — Encounter: Payer: Self-pay | Admitting: Physical Therapy

## 2020-10-01 ENCOUNTER — Other Ambulatory Visit: Payer: Self-pay

## 2020-10-01 ENCOUNTER — Ambulatory Visit: Payer: BC Managed Care – PPO | Admitting: Physical Therapy

## 2020-10-01 DIAGNOSIS — G8929 Other chronic pain: Secondary | ICD-10-CM | POA: Diagnosis not present

## 2020-10-01 DIAGNOSIS — M25512 Pain in left shoulder: Secondary | ICD-10-CM | POA: Diagnosis not present

## 2020-10-01 DIAGNOSIS — M25561 Pain in right knee: Secondary | ICD-10-CM | POA: Diagnosis not present

## 2020-10-01 NOTE — Patient Instructions (Signed)
Access Code: [email or web address] URL: [email or web address] Date: 10/01/2020 Prepared by: Almyra Free  Exercises Seated Levator Scapulae Stretch (Mirrored) - 2 x daily - 7 x weekly - 1 sets - 3 reps - 30-60 hold Sitting Knee Extension with Resistance - 1 x daily - 3 x weekly - 1 sets - 5 reps - 45 seconds hold Seated Piriformis Stretch with Trunk Bend - 2 x daily - 7 x weekly - 1 sets - 3 reps - 30-60 sec hold Supine Hamstring Stretch with Strap - 2 x daily - 7 x weekly - 1 sets - 3 reps - 60 sec hold

## 2020-10-01 NOTE — Therapy (Signed)
[facility] [address] [place], [place], [address] Phone: [phone]   Fax:  [phone]  Physical Therapy Treatment  Patient Details  Name: Edward [name] MRN: [id number] Date of Birth: 02/02/1976 Referring Provider (PT): Hulan Saas DO   Encounter Date: 10/01/2020   PT End of Session - 10/01/20 1214     Visit Number 1    Number of Visits 12    Date for PT Re-Evaluation 11/12/20    Authorization Type BCBS    PT Start Time 1215    PT Stop Time 1302    PT Time Calculation (min) 47 min    Activity Tolerance Patient tolerated treatment well    Behavior During Therapy [facility] for tasks assessed/performed             History reviewed. No pertinent past medical history.  History reviewed. No pertinent surgical history.  There were no vitals filed for this visit.   Subjective Assessment - 10/01/20 1218     Subjective Originally in 2020, pt was running down hill and heard knee pop a bit. He had deep fissures on either side of the patellar apex. He has sorenes and achiness everyday. Feels like tendonitis. Patient bikes, runs and swims, but he is hesitant to get back to it. He reports chronic left scapular dyskinesis. He always has a huge knot and keeps me up at night. The scapular pain is worse than the knee pain.    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 Yes    Pain Score 3     Pain Location Knee    Pain Orientation Right    Pain Descriptors / Indicators Aching;Sore    Pain Type Chronic pain    Pain Onset More than a month ago    Pain Frequency Constant    Aggravating Factors  prolonged standing, stairs    Pain Relieving Factors ice    Multiple Pain Sites Yes    Pain Score 8    Pain Location Scapula    Pain Orientation Left    Pain  Descriptors / Indicators Sharp    Pain Type Chronic pain    Pain Onset More than a month ago    Pain Frequency Constant    Aggravating Factors  driving    Pain Relieving Factors swimming                [facility] PT Assessment - 10/01/20 0001       Assessment   Medical Diagnosis right knee pain; left scapuar dykinesis    Referring Provider (PT) Hulan Saas DO    Onset Date/Surgical Date 07/15/20    Hand Dominance Right    Prior Therapy yes for other things      Precautions   Precautions None      Restrictions   Weight Bearing Restrictions No      Balance Screen   Has the patient fallen in the past 6 months No    Has the patient had a decrease in activity level because of a fear of falling?  No    Is the patient reluctant to leave their home because of a fear of falling?  No      Home Ecologist residence  Prior Function   Level of Independence Independent    Vocation Full time employment    Engineer, manufacturing job    Leisure runs, swims, Therapist, nutritional Comments mild forward head; right genu varus      ROM / Strength   AROM / PROM / Strength AROM;Strength;PROM      AROM   Overall AROM Comments Bil knees 0-138 flexion    AROM Assessment Site Shoulder    Right/Left Shoulder Left    Left Shoulder Extension --   full   Left Shoulder Flexion 155 Degrees    Left Shoulder ABduction 154 Degrees    Left Shoulder Internal Rotation 30 Degrees    Left Shoulder External Rotation --   full   Left Shoulder Horizontal ABduction --   full     PROM   PROM Assessment Site Shoulder    Right/Left Shoulder Left    Left Shoulder Flexion 161 Degrees    Left Shoulder ABduction 180 Degrees    Left Shoulder Internal Rotation 50 Degrees      Strength   Overall Strength Comments right knee 4+/5, Rt hip 5/5    Strength Assessment Site Shoulder    Right/Left Shoulder Left    Left Shoulder Flexion 4+/5     Left Shoulder Extension 5/5    Left Shoulder ABduction 4+/5    Left Shoulder Internal Rotation 5/5    Left Shoulder External Rotation 5/5    Left Shoulder Horizontal ABduction --   mid traps 4-/5; low traps 4/5; rhomboids 4/5     Flexibility   Soft Tissue Assessment /Muscle Length yes   tight left subscap and lats   Hamstrings bil; left > right    Quadriceps WNL    ITB mild tightnes right    Piriformis tight right      Palpation   Spinal mobility bil cspine decreased with tenderness throughout; Tspine WNL    Palpation comment left levator, distal ITB, distal VMO, RF and VLO      Special Tests   Other special tests -ober right                                   PT Education - 10/01/20 1611     Education Details HEP    Person(s) Educated Patient    Methods Explanation;Demonstration;Handout    Comprehension Verbalized understanding;Returned demonstration                 PT Long Term Goals - 10/01/20 1636       PT LONG TERM GOAL #1   Title Ind with advanced HEP    Time 6    Period Weeks    Status New      PT LONG TERM GOAL #2   Title Decreased pain in left scapula with ADLS by [id number]    Time 6    Period Weeks    Status New      PT LONG TERM GOAL #3   Title Able to sleep without waking from scapular pain    Time 6    Period Weeks    Status New      PT LONG TERM GOAL #4   Title Improved posterior left shoulder strength to 4+ to 5/5    Time 6    Period Weeks    Status New      PT  LONG TERM GOAL #5   Title decreased right knee pain with stairs and ADLS by [id number] and no pain at rest.    Time 6    Period Weeks    Status New      Additional Long Term Goals   Additional Long Term Goals Yes      PT LONG TERM GOAL #6   Title Patient able to tolerate biking for 30 min and a return to running protocol with 3/10 pain or less.    Time 6    Period Weeks    Status New      PT LONG TERM GOAL #7   Title Improved left shoulder IR to >= 60  deg to normalize function.    Time 6    Period Weeks    Status New      PT LONG TERM GOAL #8   Title improved left shoulder flexion to >=165 to normalize OH ADLS.    Time 6    Period Weeks    Status New                   Plan - 10/01/20 1611     Clinical Impression Statement Patient presents to PT with complaints of right knee pain x 2 years and left scapular pain for many years. His scapular pain is chronic and worse than the knee. It is constant and keeps him from sleeping and he has pain with driving and ADLS. He has marked tightness in his left levator scapula with TPs and also tightness in subscapularis and lats. His ROM is significantly limited in IR, flex and ABD. He has mild shoulder weakness and deficits in mid/low traps and rhomboids strength. Thoracic spine PA mobility is WNL. His right knee MRI shows fissures in either side of patellar apex. He had PRP in June and reports no significant change in pain. Patient would like to return to running, swimmiing and biking. He also has pain with stairs. He has tenderness in distal quadriceps muscles and intermittent tenderness at medial joint line. His right hip is tight which is likely contributing to knee pain. Knee ROM is normal and MMT is 4+/5, however we did not have time for a functional assessment of knee strength today. He will benefit from PT to address these deficits and improve his ADLS.    Personal Factors and Comorbidities Comorbidity 2    Comorbidities ankle sprains, left torn HS 2005    Examination-Activity Limitations Sleep;Stairs;Other   running, biking, swimming   Stability/Clinical Decision Making Stable/Uncomplicated    Clinical Decision Making Low    Rehab Potential Excellent    PT Frequency 2x / week    PT Duration 6 weeks    PT Treatment/Interventions ADLs/Self Care Home Management;Cryotherapy;Electrical Stimulation;Iontophoresis '4mg'$ /ml Dexamethasone;Moist Heat;Neuromuscular re-education;Balance  training;Therapeutic exercise;Therapeutic activities;Patient/family education;Manual techniques;Dry needling;Taping;Joint Manipulations;Spinal Manipulations    PT Next Visit Plan Knee: assess functional ability; assess response to 45 sec isometrics (2 min rest); scapula: DN to levator, subscap; prone shoulder strength    PT Home Exercise Plan [email or web address]    Consulted and Agree with Plan of Care Patient             Patient will benefit from skilled therapeutic intervention in order to improve the following deficits and impairments:  Decreased range of motion, Impaired UE functional use, Pain, Decreased activity tolerance, Decreased strength, Impaired flexibility  Visit Diagnosis: Chronic pain of right knee  Chronic left shoulder pain     Problem List Patient  Active Problem List   Diagnosis Date Noted   Bowel habit changes 09/22/2020   Insomnia 09/22/2020   Hypothyroid 09/22/2020   Nonallopathic lesion of thoracic region 11/25/2018   Nonallopathic lesion of sacral region 11/25/2018   Nonallopathic lesion of lumbosacral region 11/25/2018   Nonallopathic lesion of rib cage 11/25/2018   Nonallopathic lesion of cervical region 11/25/2018   Trigger point of left shoulder region 08/29/2018   Patellofemoral arthralgia of right knee 07/25/2018   Scapular dyskinesis 07/25/2018   Family history of prostate cancer in father 07/16/2018    Madelyn Flavors PT 10/01/2020, 4:47 PM  [place], [place], [address] Phone: [phone]   Fax:  [phone]  Name: Samie Sever MRN: [id number] Date of Birth: 1975/11/04

## 2020-10-06 ENCOUNTER — Encounter: Payer: Self-pay | Admitting: Internal Medicine

## 2020-10-12 ENCOUNTER — Other Ambulatory Visit: Payer: Self-pay

## 2020-10-12 ENCOUNTER — Encounter: Payer: Self-pay | Admitting: Physical Therapy

## 2020-10-12 ENCOUNTER — Ambulatory Visit: Payer: BC Managed Care – PPO | Admitting: Physical Therapy

## 2020-10-12 DIAGNOSIS — M25561 Pain in right knee: Secondary | ICD-10-CM | POA: Diagnosis not present

## 2020-10-12 DIAGNOSIS — M25512 Pain in left shoulder: Secondary | ICD-10-CM | POA: Diagnosis not present

## 2020-10-12 DIAGNOSIS — G8929 Other chronic pain: Secondary | ICD-10-CM | POA: Diagnosis not present

## 2020-10-12 NOTE — Therapy (Signed)
[place], [place], [address] Phone: [phone]   Fax:  [phone]  Physical Therapy Treatment  Patient Details  Name: Edward [name] MRN: [id number] Date of Birth: 1975-06-19 Referring Provider (PT): Hulan Saas DO   Encounter Date: 10/12/2020   PT End of Session - 10/12/20 2055     Visit Number 2    Number of Visits 12    Date for PT Re-Evaluation 11/12/20    Authorization Type BCBS    PT Start Time [id number]    PT Stop Time 1650    PT Time Calculation (min) 43 min    Activity Tolerance Patient tolerated treatment well    Behavior During Therapy [facility] for tasks assessed/performed             History reviewed. No pertinent past medical history.  History reviewed. No pertinent surgical history.  There were no vitals filed for this visit.   Subjective Assessment - 10/12/20 1613     Subjective Pt states ongoing pain in R knee and L med scap border. Has been doing HEP.    Pertinent History PRP June 2022, ankle sprains, left torn HS 2005    Diagnostic tests MRI -Focal deep chondral fissures on either side of the patellar apex    Patient Stated Goals get rid of pain if possible    Pain Onset More than a month ago    Pain Onset More than a month ago                               [facility] Adult PT Treatment/Exercise - 10/12/20 0001       Exercises   Exercises Knee/Hip;Shoulder      Knee/Hip Exercises: Aerobic   Recumbent Bike L2 x 5 min      Knee/Hip Exercises: Seated   Long Arc Quad 15 reps;Both    Long Arc Quad Weight 3 lbs.    Sit to [employer] 10 reps      Shoulder Exercises: Stretch   Other Shoulder Stretches upper trap and levator stretch: reviewed for HEP,  Posterior shoulder stretch : reviewed for HEP      Manual Therapy   Manual Therapy Joint mobilization;Soft tissue mobilization    Manual therapy comments skilled palpation and monitoring of soft tissue with dry needling.     Joint Mobilization thoracic PA mobs;    Soft tissue mobilization DTM/ TPR to L UT, levator, rhomboid and thoracic parapsinals,  DTM/IASTM to distal quad on R;              Trigger Point Dry Needling - 10/12/20 0001     Consent Given? Yes    Education Handout Provided Yes    Muscles Treated Head and Neck Levator scapulae    Muscles Treated Back/Hip Thoracic multifidi    Levator Scapulae Response Twitch response elicited;Palpable increased muscle length   L   Thoracic multifidi response Palpable increased muscle length   L  T 4-6                      PT Long Term Goals - 10/01/20 1636       PT LONG TERM GOAL #1   Title Ind with advanced HEP    Time 6    Period Weeks    Status New      PT LONG TERM GOAL #2   Title Decreased pain in  left scapula with ADLS by [id number]    Time 6    Period Weeks    Status New      PT LONG TERM GOAL #3   Title Able to sleep without waking from scapular pain    Time 6    Period Weeks    Status New      PT LONG TERM GOAL #4   Title Improved posterior left shoulder strength to 4+ to 5/5    Time 6    Period Weeks    Status New      PT LONG TERM GOAL #5   Title decreased right knee pain with stairs and ADLS by [id number] and no pain at rest.    Time 6    Period Weeks    Status New      Additional Long Term Goals   Additional Long Term Goals Yes      PT LONG TERM GOAL #6   Title Patient able to tolerate biking for 30 min and a return to running protocol with 3/10 pain or less.    Time 6    Period Weeks    Status New      PT LONG TERM GOAL #7   Title Improved left shoulder IR to >= 60 deg to normalize function.    Time 6    Period Weeks    Status New      PT LONG TERM GOAL #8   Title improved left shoulder flexion to >=165 to normalize OH ADLS.    Time 6    Period Weeks    Status New                   Plan - 10/12/20 2100     Clinical Impression Statement Pt with tenderness at proximal patella/distal quad,  addressed with manual and IASTM today. Pt able to perform light ther ex for knee today without increased pain. Much tenderness and tightness in L levator and into L rhomboid, addressed with DTM, TPR, and dry needling today. Pt to benefit from continued manual for pain and tension relief, and progression to strengthening as tolerated.    Personal Factors and Comorbidities Comorbidity 2    Comorbidities ankle sprains, left torn HS 2005    Examination-Activity Limitations Sleep;Stairs;Other   running, biking, swimming   Stability/Clinical Decision Making Stable/Uncomplicated    Rehab Potential Excellent    PT Frequency 2x / week    PT Duration 6 weeks    PT Treatment/Interventions ADLs/Self Care Home Management;Cryotherapy;Electrical Stimulation;Iontophoresis '4mg'$ /ml Dexamethasone;Moist Heat;Neuromuscular re-education;Balance training;Therapeutic exercise;Therapeutic activities;Patient/family education;Manual techniques;Dry needling;Taping;Joint Manipulations;Spinal Manipulations    PT Next Visit Plan Knee: assess functional ability; assess response to 45 sec isometrics (2 min rest); scapula: DN to levator, subscap; prone shoulder strength    PT Home Exercise Plan [email or web address]    Consulted and Agree with Plan of Care Patient             Patient will benefit from skilled therapeutic intervention in order to improve the following deficits and impairments:  Decreased range of motion, Impaired UE functional use, Pain, Decreased activity tolerance, Decreased strength, Impaired flexibility  Visit Diagnosis: Chronic pain of right knee  Chronic left shoulder pain     Problem List Patient Active Problem List   Diagnosis Date Noted   Bowel habit changes 09/22/2020   Insomnia 09/22/2020   Hypothyroid 09/22/2020   Nonallopathic lesion of thoracic region 11/25/2018   Nonallopathic lesion of sacral region 11/25/2018  Nonallopathic lesion of lumbosacral region 11/25/2018   Nonallopathic lesion of rib  cage 11/25/2018   Nonallopathic lesion of cervical region 11/25/2018   Trigger point of left shoulder region 08/29/2018   Patellofemoral arthralgia of right knee 07/25/2018   Scapular dyskinesis 07/25/2018   Family history of prostate cancer in father 07/16/2018    Lyndee Hensen, PT, DPT 9:10 PM  10/12/20   [place], [place], [address] Phone: [phone]   Fax:  [phone]  Name: Domenik Rumpel MRN: [id number] Date of Birth: 1975-11-06

## 2020-10-13 ENCOUNTER — Encounter: Payer: Self-pay | Admitting: Gastroenterology

## 2020-10-14 ENCOUNTER — Encounter: Payer: BC Managed Care – PPO | Admitting: Physical Therapy

## 2020-10-21 ENCOUNTER — Encounter: Payer: Self-pay | Admitting: Physical Therapy

## 2020-10-21 ENCOUNTER — Ambulatory Visit: Payer: BC Managed Care – PPO | Admitting: Physical Therapy

## 2020-10-21 ENCOUNTER — Other Ambulatory Visit: Payer: Self-pay

## 2020-10-21 DIAGNOSIS — M25512 Pain in left shoulder: Secondary | ICD-10-CM | POA: Diagnosis not present

## 2020-10-21 DIAGNOSIS — G8929 Other chronic pain: Secondary | ICD-10-CM

## 2020-10-21 DIAGNOSIS — M25561 Pain in right knee: Secondary | ICD-10-CM

## 2020-10-21 NOTE — Therapy (Signed)
[facility] [address] [place], [place], [address] Phone: [phone]   Fax:  [phone]  Physical Therapy Treatment  Patient Details  Name: Edward [name] MRN: [id number] Date of Birth: 1975-07-23 Referring Provider (PT): Hulan Saas DO   Encounter Date: 10/21/2020   PT End of Session - 10/21/20 1143     Visit Number 3    Number of Visits 12    Date for PT Re-Evaluation 11/12/20    Authorization Type BCBS    PT Start Time [phone]    PT Stop Time 1018    PT Time Calculation (min) 42 min    Activity Tolerance Patient tolerated treatment well    Behavior During Therapy [facility] for tasks assessed/performed             History reviewed. No pertinent past medical history.  History reviewed. No pertinent surgical history.  There were no vitals filed for this visit.   Subjective Assessment - 10/21/20 1141     Subjective Pt states pain in L upper back, and only mild in R knee. Has been doing HEP    Currently in Pain? Yes    Pain Score 3     Pain Location Knee    Pain Orientation Right    Pain Descriptors / Indicators Aching;Sore    Pain Type Chronic pain    Pain Onset More than a month ago    Pain Frequency Intermittent    Pain Score 5    Pain Location Back    Pain Orientation Left;Upper    Pain Descriptors / Indicators Aching    Pain Type Chronic pain    Pain Onset More than a month ago    Pain Frequency Intermittent                               [facility] Adult PT Treatment/Exercise - 10/21/20 0001       Exercises   Exercises Knee/Hip;Shoulder      Knee/Hip Exercises: Aerobic   Recumbent Bike L2 x 6 min      Knee/Hip Exercises: Standing   SLS 30 sec x 3  bil;    Other Standing Knee Exercises Band walks BTB 20 ft x 4;      Knee/Hip Exercises: Seated   Long Arc Quad --    Long Arc [employer] --    Sit to [employer] 10 reps   10 lb     Knee/Hip Exercises: Supine   Bridges Limitations add next  visit      Knee/Hip Exercises: Sidelying   Hip ABduction 2 sets;10 reps;Right      Shoulder Exercises: IT sales professional 3 reps;30 seconds    Corner Stretch Limitations Doorway 60 and 90 deg;    Other Shoulder Stretches upper trap and levator stretch: reviewed for HEP,  Posterior shoulder stretch : reviewed for HEP    Other Shoulder Stretches IR, hands behind back 5 sec x 10;      Manual Therapy   Manual Therapy Joint mobilization;Soft tissue mobilization    Manual therapy comments skilled palpation and monitoring of soft tissue with dry needling.    Joint Mobilization thoracic PA mobs;  R LE: ankle mobs to increase EV, DF, fibular glides, and tibial rotation (to improve medial rotation)    Soft tissue mobilization DTM/ TPR to L UT, levator,  Trigger Point Dry Needling - 10/21/20 0001     Consent Given? Yes    Education Handout Provided Previously provided    Muscles Treated Head and Neck Levator scapulae;Upper trapezius    Upper Trapezius Response Twitch reponse elicited;Palpable increased muscle length   L   Levator Scapulae Response Twitch response elicited;Palpable increased muscle length   L               Upper Extremity Functional Index Score :   /80   PT Education - 10/21/20 1143     Education Details Updated HEP    Person(s) Educated Patient    Methods Explanation;Demonstration;Tactile cues;Verbal cues;Handout    Comprehension Verbalized understanding;Returned demonstration;Verbal cues required;Need further instruction;Tactile cues required                 PT Long Term Goals - 10/01/20 1636       PT LONG TERM GOAL #1   Title Ind with advanced HEP    Time 6    Period Weeks    Status New      PT LONG TERM GOAL #2   Title Decreased pain in left scapula with ADLS by [id number]    Time 6    Period Weeks    Status New      PT LONG TERM GOAL #3   Title Able to sleep without waking from scapular pain    Time 6    Period Weeks     Status New      PT LONG TERM GOAL #4   Title Improved posterior left shoulder strength to 4+ to 5/5    Time 6    Period Weeks    Status New      PT LONG TERM GOAL #5   Title decreased right knee pain with stairs and ADLS by [id number] and no pain at rest.    Time 6    Period Weeks    Status New      Additional Long Term Goals   Additional Long Term Goals Yes      PT LONG TERM GOAL #6   Title Patient able to tolerate biking for 30 min and a return to running protocol with 3/10 pain or less.    Time 6    Period Weeks    Status New      PT LONG TERM GOAL #7   Title Improved left shoulder IR to >= 60 deg to normalize function.    Time 6    Period Weeks    Status New      PT LONG TERM GOAL #8   Title improved left shoulder flexion to >=165 to normalize OH ADLS.    Time 6    Period Weeks    Status New                   Plan - 10/21/20 1145     Clinical Impression Statement Pt with tightness and pain at L upper trap, levator and thoracic paraspinals. Addressed with dry needling, pt with good tolerance. May require additional sessions due to multiple areas of tension and pain. Does have tenderness and limtiation with thoracic PAs as well. Pt with noted stiffness in R ankle for EV, DF, and lack of tibal rotation. Decreased stability at hip, and position of femur/tibia today with SLS on R vs L. Added hip strength for HEP, as well as stretching and mobility for anterior shoulders. Plan to progress strength and stability for LE  as tolerated, and continue manual for pain relief in L shoulder blade region.    Personal Factors and Comorbidities Comorbidity 2    Comorbidities ankle sprains, left torn HS 2005    Examination-Activity Limitations Sleep;Stairs;Other   running, biking, swimming   Stability/Clinical Decision Making Stable/Uncomplicated    Rehab Potential Excellent    PT Frequency 2x / week    PT Duration 6 weeks    PT Treatment/Interventions ADLs/Self Care Home  Management;Cryotherapy;Electrical Stimulation;Iontophoresis '4mg'$ /ml Dexamethasone;Moist Heat;Neuromuscular re-education;Balance training;Therapeutic exercise;Therapeutic activities;Patient/family education;Manual techniques;Dry needling;Taping;Joint Manipulations;Spinal Manipulations    PT Next Visit Plan Knee: assess functional ability; assess response to 45 sec isometrics (2 min rest); scapula: DN to levator, subscap; prone shoulder strength    PT Home Exercise Plan [email or web address]    Consulted and Agree with Plan of Care Patient             Patient will benefit from skilled therapeutic intervention in order to improve the following deficits and impairments:  Decreased range of motion, Impaired UE functional use, Pain, Decreased activity tolerance, Decreased strength, Impaired flexibility  Visit Diagnosis: Chronic pain of right knee  Chronic left shoulder pain     Problem List Patient Active Problem List   Diagnosis Date Noted   Bowel habit changes 09/22/2020   Insomnia 09/22/2020   Hypothyroid 09/22/2020   Nonallopathic lesion of thoracic region 11/25/2018   Nonallopathic lesion of sacral region 11/25/2018   Nonallopathic lesion of lumbosacral region 11/25/2018   Nonallopathic lesion of rib cage 11/25/2018   Nonallopathic lesion of cervical region 11/25/2018   Trigger point of left shoulder region 08/29/2018   Patellofemoral arthralgia of right knee 07/25/2018   Scapular dyskinesis 07/25/2018   Family history of prostate cancer in father 07/16/2018    Lyndee Hensen, PT, DPT 11:49 AM  10/21/20    [place], [place], [address] Phone: [phone]   Fax:  [phone]  Name: Edward [name] MRN: [id number] Date of Birth: 02-Mar-1975

## 2020-10-23 ENCOUNTER — Encounter: Payer: Self-pay | Admitting: Physical Therapy

## 2020-10-23 ENCOUNTER — Ambulatory Visit: Payer: BC Managed Care – PPO | Admitting: Physical Therapy

## 2020-10-23 ENCOUNTER — Other Ambulatory Visit: Payer: Self-pay

## 2020-10-23 DIAGNOSIS — M25561 Pain in right knee: Secondary | ICD-10-CM

## 2020-10-23 DIAGNOSIS — G8929 Other chronic pain: Secondary | ICD-10-CM | POA: Diagnosis not present

## 2020-10-23 DIAGNOSIS — M25512 Pain in left shoulder: Secondary | ICD-10-CM | POA: Diagnosis not present

## 2020-10-23 NOTE — Therapy (Signed)
[facility] [address] [place], [place], [address] Phone: [phone]   Fax:  [phone]  Physical Therapy Treatment  Patient Details  Name: Edward [name] MRN: [id number] Date of Birth: December 29, 1975 Referring Provider (PT): Hulan Saas DO   Encounter Date: 10/23/2020   PT End of Session - 10/23/20 1614     Visit Number 4    Number of Visits 12    Date for PT Re-Evaluation 11/12/20    Authorization Type BCBS    PT Start Time [id number]    PT Stop Time 1016    PT Time Calculation (min) 41 min    Activity Tolerance Patient tolerated treatment well    Behavior During Therapy [facility] for tasks assessed/performed             History reviewed. No pertinent past medical history.  History reviewed. No pertinent surgical history.  There were no vitals filed for this visit.   Subjective Assessment - 10/23/20 1613     Subjective Pt states continued soreness in L upper thoracic. Knee feeling ok, was able to walk 4 miles this week.    Currently in Pain? Yes    Pain Score 2    Pain Location Knee    Pain Orientation Left;Upper    Pain Descriptors / Indicators Aching    Pain Type Chronic pain    Pain Onset More than a month ago    Pain Frequency Intermittent                               [facility] Adult PT Treatment/Exercise - 10/23/20 0001       Exercises   Exercises Knee/Hip;Shoulder      Knee/Hip Exercises: Aerobic   Recumbent Bike --      Knee/Hip Exercises: Standing   Functional Squat 20 reps    Functional Squat Limitations 20lb    SLS 30 sec x 3  bil;    SLS with Vectors with UE rotation x 10 bil;    Other Standing Knee Exercises Deadlift 25lb x 20;    Other Standing Knee Exercises lunges x 10 bil;  Sl RDL x 10 on R;      Knee/Hip Exercises: Seated   Sit to Sand 10 reps   10 lb     Knee/Hip Exercises: Supine   Bridges 20 reps    Bridges Limitations --      Knee/Hip Exercises: Sidelying   Hip  ABduction 2 sets;10 reps;Right      Shoulder Exercises: Archivist Limitations --    Other Shoulder Stretches --    Other Shoulder Stretches --      Manual Therapy   Manual Therapy Joint mobilization;Soft tissue mobilization    Manual therapy comments skilled palpation and monitoring of soft tissue with dry needling.    Joint Mobilization thoracic PA mobs;    Soft tissue mobilization DTM/ TPR to L levator, thoracic paraspinals.              Trigger Point Dry Needling - 10/23/20 0001     Consent Given? Yes    Education Handout Provided Previously provided    Thoracic multifidi response Palpable increased muscle length   L  T4-7                       PT Long Term Goals - 10/01/20 1636  PT LONG TERM GOAL #1   Title Ind with advanced HEP    Time 6    Period Weeks    Status New      PT LONG TERM GOAL #2   Title Decreased pain in left scapula with ADLS by [id number]    Time 6    Period Weeks    Status New      PT LONG TERM GOAL #3   Title Able to sleep without waking from scapular pain    Time 6    Period Weeks    Status New      PT LONG TERM GOAL #4   Title Improved posterior left shoulder strength to 4+ to 5/5    Time 6    Period Weeks    Status New      PT LONG TERM GOAL #5   Title decreased right knee pain with stairs and ADLS by [id number] and no pain at rest.    Time 6    Period Weeks    Status New      Additional Long Term Goals   Additional Long Term Goals Yes      PT LONG TERM GOAL #6   Title Patient able to tolerate biking for 30 min and a return to running protocol with 3/10 pain or less.    Time 6    Period Weeks    Status New      PT LONG TERM GOAL #7   Title Improved left shoulder IR to >= 60 deg to normalize function.    Time 6    Period Weeks    Status New      PT LONG TERM GOAL #8   Title improved left shoulder flexion to >=165 to normalize OH ADLS.    Time 6    Period Weeks    Status  New                   Plan - 10/23/20 1617     Clinical Impression Statement Pt with improved tenderness in L levator, but continues to have most pain and tenderness in L thoracic region, in muculature and with PA mobs. Manual therapy done to address pain and tightness today, plan to add more strengthening ther ex next visit. Pt did very well with strength progressions for LE today. Discussed strengthening progression and continuing to not overload anterior knee. Pt with improved mechanics wtih squat and lunge after education and practice. Discussed starting interval running for very short distances if knee still pain free.    Personal Factors and Comorbidities Comorbidity 2    Comorbidities ankle sprains, left torn HS 2005    Examination-Activity Limitations Sleep;Stairs;Other   running, biking, swimming   Stability/Clinical Decision Making Stable/Uncomplicated    Rehab Potential Excellent    PT Frequency 2x / week    PT Duration 6 weeks    PT Treatment/Interventions ADLs/Self Care Home Management;Cryotherapy;Electrical Stimulation;Iontophoresis '4mg'$ /ml Dexamethasone;Moist Heat;Neuromuscular re-education;Balance training;Therapeutic exercise;Therapeutic activities;Patient/family education;Manual techniques;Dry needling;Taping;Joint Manipulations;Spinal Manipulations    PT Next Visit Plan Knee: assess functional ability; assess response to 45 sec isometrics (2 min rest); scapula: DN to levator, subscap; prone shoulder strength    PT Home Exercise Plan [email or web address]    Consulted and Agree with Plan of Care Patient             Patient will benefit from skilled therapeutic intervention in order to improve the following deficits and impairments:  Decreased range of motion, Impaired UE functional  use, Pain, Decreased activity tolerance, Decreased strength, Impaired flexibility  Visit Diagnosis: Chronic pain of right knee  Chronic left shoulder pain     Problem List Patient Active  Problem List   Diagnosis Date Noted   Bowel habit changes 09/22/2020   Insomnia 09/22/2020   Hypothyroid 09/22/2020   Nonallopathic lesion of thoracic region 11/25/2018   Nonallopathic lesion of sacral region 11/25/2018   Nonallopathic lesion of lumbosacral region 11/25/2018   Nonallopathic lesion of rib cage 11/25/2018   Nonallopathic lesion of cervical region 11/25/2018   Trigger point of left shoulder region 08/29/2018   Patellofemoral arthralgia of right knee 07/25/2018   Scapular dyskinesis 07/25/2018   Family history of prostate cancer in father 07/16/2018   Lyndee Hensen, PT, DPT 4:20 PM  10/23/20    [facility] [place], [place], [address] Phone: [phone]   Fax:  [phone]  Name: Edward [name] MRN: [id number] Date of Birth: 04/03/1975

## 2020-10-26 ENCOUNTER — Other Ambulatory Visit: Payer: Self-pay

## 2020-10-26 ENCOUNTER — Ambulatory Visit: Payer: BC Managed Care – PPO | Admitting: Physical Therapy

## 2020-10-26 ENCOUNTER — Encounter: Payer: Self-pay | Admitting: Physical Therapy

## 2020-10-26 DIAGNOSIS — M25561 Pain in right knee: Secondary | ICD-10-CM | POA: Diagnosis not present

## 2020-10-26 DIAGNOSIS — G8929 Other chronic pain: Secondary | ICD-10-CM | POA: Diagnosis not present

## 2020-10-26 DIAGNOSIS — M25512 Pain in left shoulder: Secondary | ICD-10-CM

## 2020-10-26 NOTE — Therapy (Signed)
[facility] [address] [place], [place], [address] Phone: [phone]   Fax:  [phone]  Physical Therapy Treatment  Patient Details  Name: Edward [name] MRN: [id number] Date of Birth: 04-15-1975 Referring Provider (PT): Hulan Saas DO   Encounter Date: 10/26/2020   PT End of Session - 10/26/20 1052     Visit Number 5    Number of Visits 12    Date for PT Re-Evaluation 11/12/20    Authorization Type BCBS    PT Start Time [phone]    PT Stop Time 1020    PT Time Calculation (min) 44 min    Activity Tolerance Patient tolerated treatment well    Behavior During Therapy [facility] for tasks assessed/performed             History reviewed. No pertinent past medical history.  History reviewed. No pertinent surgical history.  There were no vitals filed for this visit.   Subjective Assessment - 10/26/20 1050     Subjective Pt states continued sorensss in L upper thoracic, mostly with longer duration seated/work, driving, and sleeping. Was able to run(intervals) over the weekened, states slight soreness after.    Currently in Pain? Yes    Pain Location Knee    Pain Orientation Right    Pain Descriptors / Indicators Aching;Sore    Pain Type Chronic pain    Pain Onset More than a month ago    Pain Frequency Intermittent    Pain Score 2    Pain Location Back    Pain Orientation Left;Upper    Pain Descriptors / Indicators Aching;Tightness    Pain Type Chronic pain    Pain Onset More than a month ago    Pain Frequency Intermittent                               [facility] Adult PT Treatment/Exercise - 10/26/20 0001       Knee/Hip Exercises: Stretches   Piriformis Stretch 3 reps;30 seconds    Piriformis Stretch Limitations review for HEP    Other Knee/Hip Stretches HIP flexor stretch kneeling 30 sec  x 2 bil; Off side of table 30 sec x 2 bil;      Shoulder Exercises: Prone   Other Prone Exercises high plank 30  sec x 2 , education for scapular activation      Shoulder Exercises: Standing   Row 20 reps    Theraband Level (Shoulder Row) Level 4 (Blue)    Other Standing Exercises Horizontal abd 20 GTB;   wall push ups x 20;      Shoulder Exercises: ROM/Strengthening   UBE (Upper Arm Bike) L1 x 3 min bwd      Shoulder Exercises: Stretch   Corner Stretch 3 reps;30 seconds    Corner Stretch Limitations Doorway 60 and 90 deg;      Manual Therapy   Manual therapy comments skilled palpation and monitoring of soft tissue with dry needling.    Joint Mobilization thoracic PA mobs;    Soft tissue mobilization DTM/ TPR to L levator,              Trigger Point Dry Needling - 10/26/20 0001     Consent Given? Yes    Education Handout Provided Previously provided    Upper Trapezius Response Twitch reponse elicited;Palpable increased muscle length   L   Levator Scapulae Response Twitch response elicited;Palpable increased muscle length  L                  PT Education - 10/26/20 1051     Education Details updated HEP    Person(s) Educated Patient    Methods Explanation;Demonstration;Tactile cues;Verbal cues;Handout    Comprehension Verbalized understanding;Returned demonstration;Verbal cues required;Tactile cues required;Need further instruction                 PT Long Term Goals - 10/01/20 1636       PT LONG TERM GOAL #1   Title Ind with advanced HEP    Time 6    Period Weeks    Status New      PT LONG TERM GOAL #2   Title Decreased pain in left scapula with ADLS by [id number]    Time 6    Period Weeks    Status New      PT LONG TERM GOAL #3   Title Able to sleep without waking from scapular pain    Time 6    Period Weeks    Status New      PT LONG TERM GOAL #4   Title Improved posterior left shoulder strength to 4+ to 5/5    Time 6    Period Weeks    Status New      PT LONG TERM GOAL #5   Title decreased right knee pain with stairs and ADLS by [id number] and no pain  at rest.    Time 6    Period Weeks    Status New      Additional Long Term Goals   Additional Long Term Goals Yes      PT LONG TERM GOAL #6   Title Patient able to tolerate biking for 30 min and a return to running protocol with 3/10 pain or less.    Time 6    Period Weeks    Status New      PT LONG TERM GOAL #7   Title Improved left shoulder IR to >= 60 deg to normalize function.    Time 6    Period Weeks    Status New      PT LONG TERM GOAL #8   Title improved left shoulder flexion to >=165 to normalize OH ADLS.    Time 6    Period Weeks    Status New                   Plan - 10/26/20 1053     Clinical Impression Statement Pt with continued tightness and sorenss in L upper back, UT, levator, and upper thoracic. He is having some pain reduction, and will benefit from continued manual to reduce painful trigger points. Progressed ther ex for strengthening in scapular region today, updated HEP to include. Discussed some implications for yoga practice, with increased activation of scapular muscles for increased stability. Pt challenged with these positions today due to weakness. Also educated on hip stretches and opening for hip flexor and rotation. Pt was able to run intervals this weekend, with some pain following run. Plan to assess running technique next visit.    Personal Factors and Comorbidities Comorbidity 2    Comorbidities ankle sprains, left torn HS 2005    Examination-Activity Limitations Sleep;Stairs;Other   running, biking, swimming   Stability/Clinical Decision Making Stable/Uncomplicated    Rehab Potential Excellent    PT Frequency 2x / week    PT Duration 6 weeks    PT Treatment/Interventions ADLs/Self  Care Home Management;Cryotherapy;Electrical Stimulation;Iontophoresis '4mg'$ /ml Dexamethasone;Moist Heat;Neuromuscular re-education;Balance training;Therapeutic exercise;Therapeutic activities;Patient/family education;Manual techniques;Dry needling;Taping;Joint  Manipulations;Spinal Manipulations    PT Next Visit Plan Knee: assess functional ability; assess response to 45 sec isometrics (2 min rest); scapula: DN to levator, subscap; prone shoulder strength    PT Home Exercise Plan [email or web address]    Consulted and Agree with Plan of Care Patient             Patient will benefit from skilled therapeutic intervention in order to improve the following deficits and impairments:  Decreased range of motion, Impaired UE functional use, Pain, Decreased activity tolerance, Decreased strength, Impaired flexibility  Visit Diagnosis: Chronic pain of right knee  Chronic left shoulder pain     Problem List Patient Active Problem List   Diagnosis Date Noted   Bowel habit changes 09/22/2020   Insomnia 09/22/2020   Hypothyroid 09/22/2020   Nonallopathic lesion of thoracic region 11/25/2018   Nonallopathic lesion of sacral region 11/25/2018   Nonallopathic lesion of lumbosacral region 11/25/2018   Nonallopathic lesion of rib cage 11/25/2018   Nonallopathic lesion of cervical region 11/25/2018   Trigger point of left shoulder region 08/29/2018   Patellofemoral arthralgia of right knee 07/25/2018   Scapular dyskinesis 07/25/2018   Family history of prostate cancer in father 07/16/2018   Lyndee Hensen, PT, DPT 10:56 AM  10/26/20    [facility], [place], [address] Phone: [phone]   Fax:  [phone]  Name: Edward [name] MRN: [id number] Date of Birth: 09-04-1975

## 2020-10-28 ENCOUNTER — Encounter: Payer: Self-pay | Admitting: Physical Therapy

## 2020-10-28 ENCOUNTER — Other Ambulatory Visit: Payer: Self-pay

## 2020-10-28 ENCOUNTER — Ambulatory Visit: Payer: BC Managed Care – PPO | Admitting: Physical Therapy

## 2020-10-28 DIAGNOSIS — M25561 Pain in right knee: Secondary | ICD-10-CM

## 2020-10-28 DIAGNOSIS — M25512 Pain in left shoulder: Secondary | ICD-10-CM

## 2020-10-28 DIAGNOSIS — G8929 Other chronic pain: Secondary | ICD-10-CM

## 2020-10-28 NOTE — Therapy (Signed)
[facility] [address] [place], [place], [address] Phone: [phone]   Fax:  [phone]  Physical Therapy Treatment  Patient Details  Name: Edward [name] MRN: [id number] Date of Birth: [birth year] Referring Provider (PT): Hulan Saas DO   Encounter Date: 10/28/2020   PT End of Session - 10/28/20 1230     Visit Number 6    Number of Visits 12    Date for PT Re-Evaluation 11/12/20    Authorization Type BCBS    PT Start Time 0933    PT Stop Time 1017    PT Time Calculation (min) 44 min    Activity Tolerance Patient tolerated treatment well    Behavior During Therapy [facility] for tasks assessed/performed             History reviewed. No pertinent past medical history.  History reviewed. No pertinent surgical history.  There were no vitals filed for this visit.   Subjective Assessment - 10/28/20 1229     Subjective Pt states some less pain in upper back, it is still tender from dry needling. R knee with minimal change in pain. He has been doing strengthening exercises.    Currently in Pain? Yes    Pain Score 3     Pain Location Knee    Pain Orientation Right    Pain Descriptors / Indicators Aching;Sore    Pain Type Chronic pain    Pain Onset More than a month ago    Pain Frequency Intermittent    Pain Score 2    Pain Location Back    Pain Orientation Left;Upper    Pain Descriptors / Indicators Aching;Tightness    Pain Type Chronic pain    Pain Onset More than a month ago    Pain Frequency Intermittent                               [facility] Adult PT Treatment/Exercise - 10/28/20 0001       Knee/Hip Exercises: Stretches   Piriformis Stretch --    Piriformis Stretch Limitations --    Other Knee/Hip Stretches --    Other Knee/Hip Stretches Ankle mobs at wall, DF glides, kneeling x 20;      Knee/Hip Exercises: Aerobic   Tread Mill assessment of walking/running mechanics 2.5 mph - 5.5 mph. x6 min;       Knee/Hip Exercises: Standing   Heel Raises 20 reps    Functional Squat 20 reps    Functional Squat Limitations 45 lb    SLS 30 sec x 3  bil;    SLS with Vectors with UE rotation x 10 bil;    Other Standing Knee Exercises SL RDL 10 lb 2x10 bil;      Shoulder Exercises: Prone   Other Prone Exercises high plank 30 sec x 4 , education for scapular activation      Shoulder Exercises: Standing   Row --    Theraband Level (Shoulder Row) --    Other Standing Exercises Horizontal abd 20 GTB;   wall push ups x 20;  Scaption with ER pressure GTB x 15;      Shoulder Exercises: ROM/Strengthening   UBE (Upper Arm Bike) L1 x 3 min bwd      Shoulder Exercises: Stretch   Corner Stretch 3 reps;30 seconds    Corner Stretch Limitations Doorway 60 and 90 deg;    Other Shoulder Stretches Posterior shoulder stretch 30  sec x 2 on L;      Manual Therapy   Manual therapy comments --    Joint Mobilization --    Soft tissue mobilization --                          PT Long Term Goals - 10/01/20 1636       PT LONG TERM GOAL #1   Title Ind with advanced HEP    Time 6    Period Weeks    Status New      PT LONG TERM GOAL #2   Title Decreased pain in left scapula with ADLS by [id number]    Time 6    Period Weeks    Status New      PT LONG TERM GOAL #3   Title Able to sleep without waking from scapular pain    Time 6    Period Weeks    Status New      PT LONG TERM GOAL #4   Title Improved posterior left shoulder strength to 4+ to 5/5    Time 6    Period Weeks    Status New      PT LONG TERM GOAL #5   Title decreased right knee pain with stairs and ADLS by [id number] and no pain at rest.    Time 6    Period Weeks    Status New      Additional Long Term Goals   Additional Long Term Goals Yes      PT LONG TERM GOAL #6   Title Patient able to tolerate biking for 30 min and a return to running protocol with 3/10 pain or less.    Time 6    Period Weeks    Status New      PT  LONG TERM GOAL #7   Title Improved left shoulder IR to >= 60 deg to normalize function.    Time 6    Period Weeks    Status New      PT LONG TERM GOAL #8   Title improved left shoulder flexion to >=165 to normalize OH ADLS.    Time 6    Period Weeks    Status New                   Plan - 10/28/20 1235     Clinical Impression Statement walk/run mechanics assessed today. Pt with slight toeing out posture in standing (on R), also seen with running. Increased toeing out in terminal stance and swing. Added ankle joint mobs for DF and tibial IR. Pt states his leg has "always been like that" so its possible that this is his baseline. He did also have ankle injury a few years ago, which may be contributing to stiffness. He has improved ability for strengthening today, no increased pain in knee, mild improvments in stability with SL activity today. Ther ex for strength and stabilization of scapular muscles done, pt challenged with this, due to weakness and instability. Pt to benefit form continued care.    Personal Factors and Comorbidities Comorbidity 2    Comorbidities ankle sprains, left torn HS 2005    Examination-Activity Limitations Sleep;Stairs;Other   running, biking, swimming   Stability/Clinical Decision Making Stable/Uncomplicated    Rehab Potential Excellent    PT Frequency 2x / week    PT Duration 6 weeks    PT Treatment/Interventions ADLs/Self Care Home Management;Cryotherapy;Electrical Stimulation;Iontophoresis '4mg'$ /ml  Dexamethasone;Moist Heat;Neuromuscular re-education;Balance training;Therapeutic exercise;Therapeutic activities;Patient/family education;Manual techniques;Dry needling;Taping;Joint Manipulations;Spinal Manipulations    PT Next Visit Plan Knee: assess functional ability; assess response to 45 sec isometrics (2 min rest); scapula: DN to levator, subscap; prone shoulder strength    PT Home Exercise Plan [email or web address]    Consulted and Agree with Plan of Care Patient              Patient will benefit from skilled therapeutic intervention in order to improve the following deficits and impairments:  Decreased range of motion, Impaired UE functional use, Pain, Decreased activity tolerance, Decreased strength, Impaired flexibility  Visit Diagnosis: Chronic pain of right knee  Chronic left shoulder pain     Problem List Patient Active Problem List   Diagnosis Date Noted   Bowel habit changes 09/22/2020   Insomnia 09/22/2020   Hypothyroid 09/22/2020   Nonallopathic lesion of thoracic region 11/25/2018   Nonallopathic lesion of sacral region 11/25/2018   Nonallopathic lesion of lumbosacral region 11/25/2018   Nonallopathic lesion of rib cage 11/25/2018   Nonallopathic lesion of cervical region 11/25/2018   Trigger point of left shoulder region 08/29/2018   Patellofemoral arthralgia of right knee 07/25/2018   Scapular dyskinesis 07/25/2018   Family history of prostate cancer in father 07/16/2018    Lyndee Hensen, PT, DPT 12:41 PM  10/28/20    [facility], [place], [address] Phone: [phone]   Fax:  [phone]  Name: Edward [name] MRN: [id number] Date of Birth: 1975-06-05

## 2020-11-02 ENCOUNTER — Other Ambulatory Visit: Payer: Self-pay

## 2020-11-02 ENCOUNTER — Ambulatory Visit: Payer: BC Managed Care – PPO | Admitting: Physical Therapy

## 2020-11-02 ENCOUNTER — Encounter: Payer: Self-pay | Admitting: Physical Therapy

## 2020-11-02 DIAGNOSIS — G8929 Other chronic pain: Secondary | ICD-10-CM | POA: Diagnosis not present

## 2020-11-02 DIAGNOSIS — M25512 Pain in left shoulder: Secondary | ICD-10-CM

## 2020-11-02 DIAGNOSIS — M25561 Pain in right knee: Secondary | ICD-10-CM | POA: Diagnosis not present

## 2020-11-03 NOTE — Therapy (Signed)
[facility] [address] [place], [place], [address] Phone: [phone]   Fax:  [phone]  Physical Therapy Treatment  Patient Details  Name: Edward [name] MRN: [id number] Date of Birth: 04/27/1975 Referring Provider (PT): Hulan Saas DO   Encounter Date: 11/02/2020   PT End of Session - 11/03/20 1420     Visit Number 7    Number of Visits 12    Date for PT Re-Evaluation 11/12/20    Authorization Type BCBS    PT Start Time 0932    PT Stop Time 1015    PT Time Calculation (min) 43 min    Activity Tolerance Patient tolerated treatment well    Behavior During Therapy [facility] for tasks assessed/performed             History reviewed. No pertinent past medical history.  History reviewed. No pertinent surgical history.  There were no vitals filed for this visit.   Subjective Assessment - 11/02/20 0940     Subjective Pt states soreness in L shoulder blade today. Drove 3 hrs over the weekend.    Currently in Pain? Yes    Pain Score 3     Pain Location Knee    Pain Orientation Right    Pain Descriptors / Indicators Aching;Sore    Pain Type Chronic pain    Pain Onset More than a month ago    Pain Frequency Intermittent    Pain Score 7    Pain Location Shoulder    Pain Orientation Left    Pain Descriptors / Indicators Aching;Tightness    Pain Type Chronic pain    Pain Onset More than a month ago    Pain Frequency Intermittent                               [facility] Adult PT Treatment/Exercise - 11/03/20 0001       Knee/Hip Exercises: Aerobic   Recumbent Bike L2 x 6 min      Shoulder Exercises: Supine   Protraction 20 reps    Protraction Weight (lbs) 5      Shoulder Exercises: Prone   Other Prone Exercises high plank 30 sec x 3 , education for scapular activation      Shoulder Exercises: Standing   Row 20 reps    Theraband Level (Shoulder Row) Level 4 (Blue)    Other Standing Exercises  Horizontal abd 20 GTB;   wall push ups x 20;  Scaption with ER pressure GTB x 15;      Manual Therapy   Manual therapy comments skilled palpation and monitoring of soft tissue with dry needling.    Joint Mobilization thoracic PA mobs;    Soft tissue mobilization DTM/ TPR to L thoracic, rhomoboid,  levator,              Trigger Point Dry Needling - 11/03/20 0001     Consent Given? Yes    Education Handout Provided Previously provided    Muscles Treated Upper Quadrant Rhomboids    Upper Trapezius Response Twitch reponse elicited;Palpable increased muscle length   L   Levator Scapulae Response Palpable increased muscle length   L   Rhomboids Response Twitch response elicited;Palpable increased muscle length   L   Thoracic multifidi response Palpable increased muscle length   L  PT Long Term Goals - 10/01/20 1636       PT LONG TERM GOAL #1   Title Ind with advanced HEP    Time 6    Period Weeks    Status New      PT LONG TERM GOAL #2   Title Decreased pain in left scapula with ADLS by 02%    Time 6    Period Weeks    Status New      PT LONG TERM GOAL #3   Title Able to sleep without waking from scapular pain    Time 6    Period Weeks    Status New      PT LONG TERM GOAL #4   Title Improved posterior left shoulder strength to 4+ to 5/5    Time 6    Period Weeks    Status New      PT LONG TERM GOAL #5   Title decreased right knee pain with stairs and ADLS by 58% and no pain at rest.    Time 6    Period Weeks    Status New      Additional Long Term Goals   Additional Long Term Goals Yes      PT LONG TERM GOAL #6   Title Patient able to tolerate biking for 30 min and a return to running protocol with 3/10 pain or less.    Time 6    Period Weeks    Status New      PT LONG TERM GOAL #7   Title Improved left shoulder IR to >= 60 deg to normalize function.    Time 6    Period Weeks    Status New      PT LONG TERM GOAL #8    Title improved left shoulder flexion to >=165 to normalize OH ADLS.    Time 6    Period Weeks    Status New                   Plan - 11/03/20 1421     Clinical Impression Statement Focus on L upper back pain today, more sore today than previously, due to driving over the weekend. Pt with much muscle tension in L thoracic and rhomboid region, good tolerance for manual and dry needling. Continued ther ex for improving activation and strength in same region, pt challenged with these due to weakness, will benefit from continued strengthening.    Personal Factors and Comorbidities Comorbidity 2    Comorbidities ankle sprains, left torn HS 2005    Examination-Activity Limitations Sleep;Stairs;Other   running, biking, swimming   Stability/Clinical Decision Making Stable/Uncomplicated    Rehab Potential Excellent    PT Frequency 2x / week    PT Duration 6 weeks    PT Treatment/Interventions ADLs/Self Care Home Management;Cryotherapy;Electrical Stimulation;Iontophoresis 4mg /ml Dexamethasone;Moist Heat;Neuromuscular re-education;Balance training;Therapeutic exercise;Therapeutic activities;Patient/family education;Manual techniques;Dry needling;Taping;Joint Manipulations;Spinal Manipulations    PT Next Visit Plan Knee: assess functional ability; assess response to 45 sec isometrics (2 min rest); scapula: DN to levator, subscap; prone shoulder strength    PT Home Exercise Plan [email or web address]    Consulted and Agree with Plan of Care Patient             Patient will benefit from skilled therapeutic intervention in order to improve the following deficits and impairments:  Decreased range of motion, Impaired UE functional use, Pain, Decreased activity tolerance, Decreased strength, Impaired flexibility  Visit Diagnosis: Chronic pain of right  knee  Chronic left shoulder pain     Problem List Patient Active Problem List   Diagnosis Date Noted   Bowel habit changes 09/22/2020   Insomnia  09/22/2020   Hypothyroid 09/22/2020   Nonallopathic lesion of thoracic region 11/25/2018   Nonallopathic lesion of sacral region 11/25/2018   Nonallopathic lesion of lumbosacral region 11/25/2018   Nonallopathic lesion of rib cage 11/25/2018   Nonallopathic lesion of cervical region 11/25/2018   Trigger point of left shoulder region 08/29/2018   Patellofemoral arthralgia of right knee 07/25/2018   Scapular dyskinesis 07/25/2018   Family history of prostate cancer in father 07/16/2018   Lyndee Hensen, PT, DPT 2:23 PM  11/03/20    [facility] [place], [place], [address] Phone: [phone]   Fax:  [phone]  Name: Edward [name] MRN: [id number] Date of Birth: May 04, 1975

## 2020-11-04 ENCOUNTER — Encounter: Payer: Self-pay | Admitting: Physical Therapy

## 2020-11-04 ENCOUNTER — Ambulatory Visit: Payer: BC Managed Care – PPO | Admitting: Physical Therapy

## 2020-11-04 ENCOUNTER — Other Ambulatory Visit: Payer: Self-pay

## 2020-11-04 DIAGNOSIS — G8929 Other chronic pain: Secondary | ICD-10-CM | POA: Diagnosis not present

## 2020-11-04 DIAGNOSIS — M25512 Pain in left shoulder: Secondary | ICD-10-CM

## 2020-11-04 DIAGNOSIS — M25561 Pain in right knee: Secondary | ICD-10-CM | POA: Diagnosis not present

## 2020-11-04 NOTE — Therapy (Signed)
[facility] [address] [place], [place], [address] Phone: [phone]   Fax:  [phone]  Physical Therapy Treatment  Patient Details  Name: Edward [name] MRN: [id number] Date of Birth: Jan 03, 1976 Referring Provider (PT): Hulan Saas DO   Encounter Date: 11/04/2020   PT End of Session - 11/04/20 1329     Visit Number 8    Number of Visits 12    Date for PT Re-Evaluation 11/12/20    Authorization Type BCBS    PT Start Time 0932    PT Stop Time 1015    PT Time Calculation (min) 43 min    Activity Tolerance Patient tolerated treatment well    Behavior During Therapy [facility] for tasks assessed/performed             History reviewed. No pertinent past medical history.  History reviewed. No pertinent surgical history.  There were no vitals filed for this visit.   Subjective Assessment - 11/04/20 1328     Subjective Pt states less pain in shoulder today.    Currently in Pain? Yes    Pain Score 2     Pain Location Knee    Pain Orientation Right    Pain Descriptors / Indicators Aching;Sore    Pain Type Chronic pain    Pain Onset More than a month ago    Pain Frequency Intermittent    Pain Score 4    Pain Location Shoulder    Pain Orientation Left    Pain Descriptors / Indicators Aching;Tightness    Pain Type Chronic pain    Pain Onset More than a month ago    Pain Frequency Intermittent                               [facility] Adult PT Treatment/Exercise - 11/04/20 0001       Knee/Hip Exercises: Aerobic   Recumbent Bike L2 x 6 min      Shoulder Exercises: Supine   Protraction --    Protraction Weight (lbs) --      Shoulder Exercises: Prone   Other Prone Exercises high plank 30 sec x 3 , SA press in high plank x 10    Other Prone Exercises Over PBall: T's and Is x 20 ea;      Shoulder Exercises: Standing   External Rotation 20 reps    Theraband Level (Shoulder External Rotation) Level 3  (Green)    Row --    Theraband Level (Shoulder Row) --    Other Standing Exercises Elevation with ER pressure x 10; RTB      Shoulder Exercises: ROM/Strengthening   UBE (Upper Arm Bike) L1 x 4 min bwd      Manual Therapy   Manual therapy comments skilled palpation and monitoring of soft tissue with dry needling.    Joint Mobilization thoracic PA mobs;    Soft tissue mobilization DTM/ TPR to L thoracic, rhomoboid,  levator,              Trigger Point Dry Needling - 11/04/20 0001     Consent Given? Yes    Education Handout Provided Previously provided    Thoracic multifidi response Palpable increased muscle length   L                       PT Long Term Goals - 10/01/20 1636       PT  LONG TERM GOAL #1   Title Ind with advanced HEP    Time 6    Period Weeks    Status New      PT LONG TERM GOAL #2   Title Decreased pain in left scapula with ADLS by 66%    Time 6    Period Weeks    Status New      PT LONG TERM GOAL #3   Title Able to sleep without waking from scapular pain    Time 6    Period Weeks    Status New      PT LONG TERM GOAL #4   Title Improved posterior left shoulder strength to 4+ to 5/5    Time 6    Period Weeks    Status New      PT LONG TERM GOAL #5   Title decreased right knee pain with stairs and ADLS by 29% and no pain at rest.    Time 6    Period Weeks    Status New      Additional Long Term Goals   Additional Long Term Goals Yes      PT LONG TERM GOAL #6   Title Patient able to tolerate biking for 30 min and a return to running protocol with 3/10 pain or less.    Time 6    Period Weeks    Status New      PT LONG TERM GOAL #7   Title Improved left shoulder IR to >= 60 deg to normalize function.    Time 6    Period Weeks    Status New      PT LONG TERM GOAL #8   Title improved left shoulder flexion to >=165 to normalize OH ADLS.    Time 6    Period Weeks    Status New                   Plan -  11/04/20 1330     Clinical Impression Statement Pt with decreased soreness and muscle tension today from previous visit. Tightness and soreness in levator much improved. Still has most painful place at Mid t-spine with palpation, continued to address with manual TPR and DN today due to positive response in previous visits. Pt has been able to progress ther ex, challenged due to weakness, butminimal increased pain. Will benefit from continued care.    Personal Factors and Comorbidities Comorbidity 2    Comorbidities ankle sprains, left torn HS 2005    Examination-Activity Limitations Sleep;Stairs;Other   running, biking, swimming   Stability/Clinical Decision Making Stable/Uncomplicated    Rehab Potential Excellent    PT Frequency 2x / week    PT Duration 6 weeks    PT Treatment/Interventions ADLs/Self Care Home Management;Cryotherapy;Electrical Stimulation;Iontophoresis 4mg /ml Dexamethasone;Moist Heat;Neuromuscular re-education;Balance training;Therapeutic exercise;Therapeutic activities;Patient/family education;Manual techniques;Dry needling;Taping;Joint Manipulations;Spinal Manipulations    PT Next Visit Plan Knee: assess functional ability; assess response to 45 sec isometrics (2 min rest); scapula: DN to levator, subscap; prone shoulder strength    PT Home Exercise Plan [email or web address]    Consulted and Agree with Plan of Care Patient             Patient will benefit from skilled therapeutic intervention in order to improve the following deficits and impairments:  Decreased range of motion, Impaired UE functional use, Pain, Decreased activity tolerance, Decreased strength, Impaired flexibility  Visit Diagnosis: Chronic pain of right knee  Chronic left shoulder pain  Problem List Patient Active Problem List   Diagnosis Date Noted   Bowel habit changes 09/22/2020   Insomnia 09/22/2020   Hypothyroid 09/22/2020   Nonallopathic lesion of thoracic region 11/25/2018   Nonallopathic  lesion of sacral region 11/25/2018   Nonallopathic lesion of lumbosacral region 11/25/2018   Nonallopathic lesion of rib cage 11/25/2018   Nonallopathic lesion of cervical region 11/25/2018   Trigger point of left shoulder region 08/29/2018   Patellofemoral arthralgia of right knee 07/25/2018   Scapular dyskinesis 07/25/2018   Family history of prostate cancer in father 07/16/2018    Lyndee Hensen, PT, DPT 1:32 PM  11/04/20   [facility] [place], [place], [address] Phone: [phone]   Fax:  [phone]  Name: Edward [name] MRN: [id number] Date of Birth: 1975-08-29

## 2020-11-09 ENCOUNTER — Ambulatory Visit: Payer: BC Managed Care – PPO | Admitting: Physical Therapy

## 2020-11-09 ENCOUNTER — Other Ambulatory Visit: Payer: Self-pay

## 2020-11-09 DIAGNOSIS — G8929 Other chronic pain: Secondary | ICD-10-CM

## 2020-11-09 DIAGNOSIS — M25561 Pain in right knee: Secondary | ICD-10-CM | POA: Diagnosis not present

## 2020-11-09 DIAGNOSIS — M25512 Pain in left shoulder: Secondary | ICD-10-CM | POA: Diagnosis not present

## 2020-11-11 ENCOUNTER — Other Ambulatory Visit: Payer: Self-pay

## 2020-11-11 ENCOUNTER — Ambulatory Visit: Payer: BC Managed Care – PPO | Admitting: Physical Therapy

## 2020-11-11 DIAGNOSIS — G8929 Other chronic pain: Secondary | ICD-10-CM

## 2020-11-11 DIAGNOSIS — M25512 Pain in left shoulder: Secondary | ICD-10-CM

## 2020-11-11 DIAGNOSIS — M25561 Pain in right knee: Secondary | ICD-10-CM | POA: Diagnosis not present

## 2020-11-15 ENCOUNTER — Encounter: Payer: Self-pay | Admitting: Physical Therapy

## 2020-11-15 NOTE — Therapy (Signed)
[facility] [address] [place], [place], [address] Phone: [phone]   Fax:  [phone]  Physical Therapy Treatment  Patient Details  Name: Terrence Wishon MRN: [id number] Date of Birth: August 14, 1975 Referring Provider (PT): Hulan Saas DO   Encounter Date: 11/09/2020   PT End of Session - 11/15/20 2149     Visit Number 9    Number of Visits 12    Date for PT Re-Evaluation 11/12/20    Authorization Type BCBS    PT Start Time 0933    PT Stop Time 1015    PT Time Calculation (min) 42 min    Activity Tolerance Patient tolerated treatment well    Behavior During Therapy [facility] for tasks assessed/performed             History reviewed. No pertinent past medical history.  History reviewed. No pertinent surgical history.  There were no vitals filed for this visit.   Subjective Assessment - 11/15/20 2147     Subjective Pt with soreness in shoulder today, after being away for the weekend.    Currently in Pain? Yes    Pain Score 2     Pain Location Knee    Pain Orientation Right    Pain Descriptors / Indicators Aching    Pain Type Chronic pain    Pain Onset More than a month ago    Pain Frequency Intermittent    Pain Score 4    Pain Location Shoulder    Pain Orientation Left    Pain Descriptors / Indicators Aching;Tightness    Pain Type Chronic pain    Pain Onset More than a month ago    Pain Frequency Intermittent                               [facility] Adult PT Treatment/Exercise - 11/15/20 0001       Knee/Hip Exercises: Aerobic   Recumbent Bike L2 x 6 min      Knee/Hip Exercises: Standing   Functional Squat 20 reps    Functional Squat Limitations 45 lb    SLS 30 sec x 3  bil;    SLS with Vectors with UE rotation x 10 bil;    Other Standing Knee Exercises SL RDL 10 lb 2x10 bil;      Knee/Hip Exercises: Seated   Sit to Sand 10 reps   Single Leg.     Knee/Hip Exercises: Supine   Single Leg  Bridge 10 reps      Shoulder Exercises: Prone   Other Prone Exercises high plank 30 sec x 3 , SA press in high plank x 10    Other Prone Exercises Over PBall: T's and Is x 20 ea;      Manual Therapy   Joint Mobilization thoracic PA mobs;    Soft tissue mobilization DTM/ TPR to L thoracic, rhomoboid,  levator,                          PT Long Term Goals - 10/01/20 1636       PT LONG TERM GOAL #1   Title Ind with advanced HEP    Time 6    Period Weeks    Status New      PT LONG TERM GOAL #2   Title Decreased pain in left scapula with ADLS by 02%    Time 6  Period Weeks    Status New      PT LONG TERM GOAL #3   Title Able to sleep without waking from scapular pain    Time 6    Period Weeks    Status New      PT LONG TERM GOAL #4   Title Improved posterior left shoulder strength to 4+ to 5/5    Time 6    Period Weeks    Status New      PT LONG TERM GOAL #5   Title decreased right knee pain with stairs and ADLS by 96% and no pain at rest.    Time 6    Period Weeks    Status New      Additional Long Term Goals   Additional Long Term Goals Yes      PT LONG TERM GOAL #6   Title Patient able to tolerate biking for 30 min and a return to running protocol with 3/10 pain or less.    Time 6    Period Weeks    Status New      PT LONG TERM GOAL #7   Title Improved left shoulder IR to >= 60 deg to normalize function.    Time 6    Period Weeks    Status New      PT LONG TERM GOAL #8   Title improved left shoulder flexion to >=165 to normalize OH ADLS.    Time 6    Period Weeks    Status New                   Plan - 11/15/20 2153     Clinical Impression Statement Pain levels have been better overall, but still quite variable. Pt continues to benefit from manual therapy and trigger point release for pain relief. Improving with strength for L shoulder blade musculature , but will still benefit from continued education and practice with  this. Reviewed and progressed LE strength with added Single leg strengthening. Pt challenged with this, but noted improvements in hip stability when performing and no increased knee pain. Discussed slow progression of jogging with intervals.    Personal Factors and Comorbidities Comorbidity 2    Comorbidities ankle sprains, left torn HS 2005    Examination-Activity Limitations Sleep;Stairs;Other   running, biking, swimming   Stability/Clinical Decision Making Stable/Uncomplicated    Rehab Potential Excellent    PT Frequency 2x / week    PT Duration 6 weeks    PT Treatment/Interventions ADLs/Self Care Home Management;Cryotherapy;Electrical Stimulation;Iontophoresis 4mg /ml Dexamethasone;Moist Heat;Neuromuscular re-education;Balance training;Therapeutic exercise;Therapeutic activities;Patient/family education;Manual techniques;Dry needling;Taping;Joint Manipulations;Spinal Manipulations    PT Next Visit Plan Knee: assess functional ability; assess response to 45 sec isometrics (2 min rest); scapula: DN to levator, subscap; prone shoulder strength    PT Home Exercise Plan [email or web address]    Consulted and Agree with Plan of Care Patient             Patient will benefit from skilled therapeutic intervention in order to improve the following deficits and impairments:  Decreased range of motion, Impaired UE functional use, Pain, Decreased activity tolerance, Decreased strength, Impaired flexibility  Visit Diagnosis: Chronic pain of right knee  Chronic left shoulder pain     Problem List Patient Active Problem List   Diagnosis Date Noted   Bowel habit changes 09/22/2020   Insomnia 09/22/2020   Hypothyroid 09/22/2020   Nonallopathic lesion of thoracic region 11/25/2018   Nonallopathic lesion of sacral region 11/25/2018  Nonallopathic lesion of lumbosacral region 11/25/2018   Nonallopathic lesion of rib cage 11/25/2018   Nonallopathic lesion of cervical region 11/25/2018   Trigger point of  left shoulder region 08/29/2018   Patellofemoral arthralgia of right knee 07/25/2018   Scapular dyskinesis 07/25/2018   Family history of prostate cancer in father 07/16/2018   Lyndee Hensen, PT, DPT 10:00 PM  11/15/20    [facility] [place], [place], [address] Phone: [phone]   Fax:  [phone]  Name: Shaquil Aldana MRN: [id number] Date of Birth: [birth year]

## 2020-11-15 NOTE — Therapy (Signed)
[facility] [address] [place], [place], [address] Phone: [phone]   Fax:  [phone]  Physical Therapy Treatment  Patient Details  Name: Edward [name] MRN: [id number] Date of Birth: [birth year] Referring Provider (PT): Hulan Saas DO   Encounter Date: 11/11/2020   PT End of Session - 11/15/20 2210     Visit Number 10    Number of Visits 12    Date for PT Re-Evaluation 11/12/20    Authorization Type BCBS    PT Start Time 0932    PT Stop Time 1015    PT Time Calculation (min) 43 min    Activity Tolerance Patient tolerated treatment well    Behavior During Therapy [facility] for tasks assessed/performed             No past medical history on file.  No past surgical history on file.  There were no vitals filed for this visit.   Subjective Assessment - 11/15/20 2210     Subjective Pt with slight increase in soreness in R knee today.    Currently in Pain? Yes    Pain Score 3     Pain Location Knee    Pain Orientation Right    Pain Descriptors / Indicators Aching    Pain Type Chronic pain    Pain Onset More than a month ago    Pain Frequency Intermittent    Pain Score 4    Pain Location Shoulder    Pain Orientation Left    Pain Descriptors / Indicators Aching    Pain Type Chronic pain    Pain Onset More than a month ago    Pain Frequency Intermittent                               [facility] Adult PT Treatment/Exercise - 11/15/20 2209       Knee/Hip Exercises: Seated   Sit to Sand 10 reps   Single Leg.     Knee/Hip Exercises: Supine   Bridges 10 reps    Single Leg Bridge 10 reps      Shoulder Exercises: Prone   Other Prone Exercises push ups x 15;      Shoulder Exercises: Standing   External Rotation 20 reps    Theraband Level (Shoulder External Rotation) Level 4 (Blue)    External Rotation Limitations with horiz abd press    Row 20 reps    Row Limitations black    Retraction Limitations  Low Row GTB x 15;      Shoulder Exercises: ROM/Strengthening   UBE (Upper Arm Bike) L1 x 4 min bwd      Shoulder Exercises: Stretch   Corner Stretch 3 reps;30 seconds    Corner Stretch Limitations Doorway 60 and 90 deg;      Manual Therapy   Manual therapy comments skilled palpation and monitoring of soft tissue with dry needling.    Joint Mobilization thoracic PA mobs;    Soft tissue mobilization DTM/ TPR to L thoracic, rhomoboid,  levator,              Trigger Point Dry Needling - 11/15/20 0001     Consent Given? Yes    Education Handout Provided Previously provided    Levator Scapulae Response Palpable increased muscle length;Twitch response elicited    Thoracic multifidi response Palpable increased muscle length  PT Education - 11/15/20 2210     Education Details Reviewed HEP    Person(s) Educated Patient    Methods Explanation;Demonstration;Verbal cues    Comprehension Verbalized understanding;Returned demonstration;Verbal cues required;Need further instruction                 PT Long Term Goals - 10/01/20 1636       PT LONG TERM GOAL #1   Title Ind with advanced HEP    Time 6    Period Weeks    Status New      PT LONG TERM GOAL #2   Title Decreased pain in left scapula with ADLS by 80%    Time 6    Period Weeks    Status New      PT LONG TERM GOAL #3   Title Able to sleep without waking from scapular pain    Time 6    Period Weeks    Status New      PT LONG TERM GOAL #4   Title Improved posterior left shoulder strength to 4+ to 5/5    Time 6    Period Weeks    Status New      PT LONG TERM GOAL #5   Title decreased right knee pain with stairs and ADLS by 99% and no pain at rest.    Time 6    Period Weeks    Status New      Additional Long Term Goals   Additional Long Term Goals Yes      PT LONG TERM GOAL #6   Title Patient able to tolerate biking for 30 min and a return to running protocol with 3/10 pain  or less.    Time 6    Period Weeks    Status New      PT LONG TERM GOAL #7   Title Improved left shoulder IR to >= 60 deg to normalize function.    Time 6    Period Weeks    Status New      PT LONG TERM GOAL #8   Title improved left shoulder flexion to >=165 to normalize OH ADLS.    Time 6    Period Weeks    Status New                   Plan - 11/15/20 2215     Clinical Impression Statement Pt with slight increase in knee pain today, so less LE strengthening done. Pt has tender trigger point in L rhomboid region, that continues to be bothersome. Pt with good tolerance for DN and good response with decreased pain. He has been able to increase strengthening activiteis for UEs and scapular muscles without increased pain, but is getting increased soreness at times with certain activities. Pt to benefit from continued care    Personal Factors and Comorbidities Comorbidity 2    Comorbidities ankle sprains, left torn HS 2005    Examination-Activity Limitations Sleep;Stairs;Other   running, biking, swimming   Stability/Clinical Decision Making Stable/Uncomplicated    Rehab Potential Excellent    PT Frequency 2x / week    PT Duration 6 weeks    PT Treatment/Interventions ADLs/Self Care Home Management;Cryotherapy;Electrical Stimulation;Iontophoresis 4mg /ml Dexamethasone;Moist Heat;Neuromuscular re-education;Balance training;Therapeutic exercise;Therapeutic activities;Patient/family education;Manual techniques;Dry needling;Taping;Joint Manipulations;Spinal Manipulations    PT Next Visit Plan Knee: assess functional ability; assess response to 45 sec isometrics (2 min rest); scapula: DN to levator, subscap; prone shoulder strength    PT Home Exercise Plan  [email or web address]    Consulted and Agree with Plan of Care Patient             Patient will benefit from skilled therapeutic intervention in order to improve the following deficits and impairments:  Decreased range of motion, Impaired  UE functional use, Pain, Decreased activity tolerance, Decreased strength, Impaired flexibility  Visit Diagnosis: Chronic pain of right knee  Chronic left shoulder pain     Problem List Patient Active Problem List   Diagnosis Date Noted   Bowel habit changes 09/22/2020   Insomnia 09/22/2020   Hypothyroid 09/22/2020   Nonallopathic lesion of thoracic region 11/25/2018   Nonallopathic lesion of sacral region 11/25/2018   Nonallopathic lesion of lumbosacral region 11/25/2018   Nonallopathic lesion of rib cage 11/25/2018   Nonallopathic lesion of cervical region 11/25/2018   Trigger point of left shoulder region 08/29/2018   Patellofemoral arthralgia of right knee 07/25/2018   Scapular dyskinesis 07/25/2018   Family history of prostate cancer in father 07/16/2018    Lyndee Hensen, PT, DPT 10:18 PM  11/15/20    [facility] [place], [place], [address] Phone: [phone]   Fax:  [phone]  Name: Zavian Slowey MRN: [id number] Date of Birth: [birth year]

## 2020-11-16 ENCOUNTER — Encounter: Payer: Self-pay | Admitting: Physical Therapy

## 2020-11-16 ENCOUNTER — Other Ambulatory Visit: Payer: Self-pay

## 2020-11-16 ENCOUNTER — Ambulatory Visit: Payer: BC Managed Care – PPO | Admitting: Physical Therapy

## 2020-11-16 DIAGNOSIS — G8929 Other chronic pain: Secondary | ICD-10-CM | POA: Diagnosis not present

## 2020-11-16 DIAGNOSIS — M25561 Pain in right knee: Secondary | ICD-10-CM

## 2020-11-16 DIAGNOSIS — M25512 Pain in left shoulder: Secondary | ICD-10-CM

## 2020-11-18 ENCOUNTER — Other Ambulatory Visit: Payer: Self-pay

## 2020-11-18 ENCOUNTER — Ambulatory Visit: Payer: BC Managed Care – PPO | Admitting: Physical Therapy

## 2020-11-18 ENCOUNTER — Encounter: Payer: Self-pay | Admitting: Physical Therapy

## 2020-11-18 DIAGNOSIS — M25561 Pain in right knee: Secondary | ICD-10-CM | POA: Diagnosis not present

## 2020-11-18 DIAGNOSIS — G8929 Other chronic pain: Secondary | ICD-10-CM

## 2020-11-18 DIAGNOSIS — M25512 Pain in left shoulder: Secondary | ICD-10-CM

## 2020-11-18 NOTE — Therapy (Signed)
[name] [address] [place], [place], [address] Phone: [phone]   Fax:  [phone]  Physical Therapy Treatment  Patient Details  Name: Edward [name] MRN: [id number] Date of Birth: 10/30/1975 Referring Provider (PT): Zachary Smith DO   Encounter Date: 11/18/2020   PT End of Session - 11/18/20 2141     Visit Number 12    Number of Visits 20    Date for PT Re-Evaluation 12/28/20    Authorization Type BCBS    recert done at visit 11    PT Start Time 0934    PT Stop Time 1015    PT Time Calculation (min) 41 min    Activity Tolerance Patient tolerated treatment well    Behavior During Therapy [facility] for tasks assessed/performed             History reviewed. No pertinent past medical history.  History reviewed. No pertinent surgical history.  There were no vitals filed for this visit.   Subjective Assessment - 11/18/20 2140     Subjective Pt with soreness in shoulder and knee from increased activity in last couple days.    Currently in Pain? Yes    Pain Score 3     Pain Location Knee    Pain Orientation Right    Pain Descriptors / Indicators Aching    Pain Type Chronic pain    Pain Onset More than a month ago    Pain Frequency Intermittent    Pain Score 5    Pain Location Shoulder    Pain Orientation Left    Pain Descriptors / Indicators Aching    Pain Type Chronic pain    Pain Onset More than a month ago    Pain Frequency Intermittent                               [facility] Adult PT Treatment/Exercise - 11/18/20 2158       Knee/Hip Exercises: Seated   Sit to Sand 10 reps   Single leg     Shoulder Exercises: Prone   Other Prone Exercises push ups x 15;    Other Prone Exercises Over PBall: T's and Is x 10 ea;      Shoulder Exercises: Standing   Horizontal ABduction 20 reps    Theraband Level (Shoulder Horizontal ABduction) Level 4 (Blue)    External Rotation 20 reps    Theraband Level  (Shoulder External Rotation) Level 4 (Blue)    External Rotation Limitations with horiz abd press    Row 20 reps    Row Limitations black    Retraction Limitations Low Row GTB x 15;      Shoulder Exercises: ROM/Strengthening   UBE (Upper Arm Bike) L1 x 4 min bwd      Shoulder Exercises: Stretch   Corner Stretch 3 reps;30 seconds      Manual Therapy   Manual therapy comments skilled palpation and monitoring of soft tissue with dry needling.    Joint Mobilization thoracic PA mobs;    Soft tissue mobilization DTM/ TPR to L thoracic, rhomoboid,  levator,                          PT Long Term Goals - 11/18/20 2043       PT LONG TERM GOAL #1   Title Ind with advanced HEP    Time 6      Period Weeks    Status Partially Met      PT LONG TERM GOAL #2   Title Decreased pain in left scapula with ADLS by 83%    Time 6    Period Weeks    Status New    Target Date 12/28/20      PT LONG TERM GOAL #3   Title Able to sleep without waking from scapular pain    Time 6    Period Weeks    Status Partially Met    Target Date 12/28/20      PT LONG TERM GOAL #4   Title Improved posterior left shoulder strength to 4+ to 5/5    Time 6    Period Weeks    Status Partially Met    Target Date 12/28/20      PT LONG TERM GOAL #5   Title decreased right knee pain with stairs and ADLS by 25% and no pain at rest.    Time 6    Period Weeks    Status Partially Met    Target Date 12/28/20      PT LONG TERM GOAL #6   Title Patient able to tolerate biking for 30 min and a return to running protocol with 3/10 pain or less.    Time 6    Period Weeks    Status Partially Met    Target Date 12/28/20      PT LONG TERM GOAL #7   Title Improved left shoulder IR to >= 60 deg to normalize function.    Time 6    Period Weeks    Status Achieved      PT LONG TERM GOAL #8   Title improved left shoulder flexion to >=165 to normalize OH ADLS.    Time 6    Period Weeks    Status  Achieved                   Plan - 11/18/20 2144     Clinical Impression Statement Pt with soreness in L UT, levator, and rhomboid today, addressed wtih manual therapy and DN. Pt haivng good response to manual therapy from previous sessions, but carryover has been limited , with increased pain with activity. Pt to benefit from continued care.    Personal Factors and Comorbidities Comorbidity 2    Comorbidities ankle sprains, left torn HS 2005    Examination-Activity Limitations Sleep;Stairs;Other   running, biking, swimming   Stability/Clinical Decision Making Stable/Uncomplicated    Rehab Potential Excellent    PT Frequency 2x / week    PT Duration 6 weeks    PT Treatment/Interventions ADLs/Self Care Home Management;Cryotherapy;Electrical Stimulation;Iontophoresis 74m/ml Dexamethasone;Moist Heat;Neuromuscular re-education;Balance training;Therapeutic exercise;Therapeutic activities;Patient/family education;Manual techniques;Dry needling;Taping;Joint Manipulations;Spinal Manipulations    PT Next Visit Plan Knee: assess functional ability; assess response to 45 sec isometrics (2 min rest); scapula: DN to levator, subscap; prone shoulder strength    PT Home Exercise Plan [email or web address]    Consulted and Agree with Plan of Care Patient             Patient will benefit from skilled therapeutic intervention in order to improve the following deficits and impairments:  Decreased range of motion, Impaired UE functional use, Pain, Decreased activity tolerance, Decreased strength, Impaired flexibility  Visit Diagnosis: Chronic pain of right knee  Chronic left shoulder pain     Problem List Patient Active Problem List   Diagnosis Date Noted   Bowel habit changes 09/22/2020   Insomnia  09/22/2020   Hypothyroid 09/22/2020   Nonallopathic lesion of thoracic region 11/25/2018   Nonallopathic lesion of sacral region 11/25/2018   Nonallopathic lesion of lumbosacral region 11/25/2018    Nonallopathic lesion of rib cage 11/25/2018   Nonallopathic lesion of cervical region 11/25/2018   Trigger point of left shoulder region 08/29/2018   Patellofemoral arthralgia of right knee 07/25/2018   Scapular dyskinesis 07/25/2018   Family history of prostate cancer in father 07/16/2018   Lauren Carroll, PT, DPT 9:59 PM  11/18/20    [facility] [address] [place], [place], [address] Phone: [phone]   Fax:  [phone]  Name: Kenwood Tarry MRN: [id number] Date of Birth: 03/11/1975    

## 2020-11-18 NOTE — Therapy (Signed)
[facility] [address] [place], [place], [address] Phone: [phone]   Fax:  [phone]  Physical Therapy Treatment/Re-Cert  Patient Details  Name: Edward [name] MRN: [id number] Date of Birth: April 05, 1975 Referring Provider (PT): Hulan Saas DO   Encounter Date: 11/16/2020   PT End of Session - 11/18/20 2043     Visit Number 11    Number of Visits 20    Date for PT Re-Evaluation 12/28/20    Authorization Type BCBS    recert done at visit 11    PT Start Time 0935    PT Stop Time 1016    PT Time Calculation (min) 41 min    Activity Tolerance Patient tolerated treatment well    Behavior During Therapy [facility] for tasks assessed/performed             History reviewed. No pertinent past medical history.  History reviewed. No pertinent surgical history.  There were no vitals filed for this visit.   Subjective Assessment - 11/18/20 2042     Subjective Pt was able to run/ intervals (2 min ) for 4 miles. States slight sorenss in knee. Also has mild soreness in L shoulder,    Patient Stated Goals decreased pain    Currently in Pain? Yes    Pain Score 3     Pain Location Knee    Pain Orientation Right    Pain Descriptors / Indicators Aching    Pain Type Chronic pain    Pain Onset More than a month ago    Pain Frequency Intermittent    Pain Score 5    Pain Location Shoulder    Pain Orientation Left    Pain Descriptors / Indicators Aching    Pain Type Chronic pain    Pain Onset More than a month ago    Pain Frequency Intermittent                [facility] PT Assessment - 11/18/20 0001       Assessment   Medical Diagnosis right knee pain; left scapuar dykinesis    Referring Provider (PT) Hulan Saas DO    Onset Date/Surgical Date 07/15/20    Hand Dominance Right    Prior Therapy yes for other things      Precautions   Precautions None      AROM   Left Shoulder Flexion --   wnl   Left Shoulder ABduction --    wnl   Left Shoulder Internal Rotation --   wnl     Strength   Left Shoulder Flexion 5/5    Left Shoulder Extension 5/5    Left Shoulder ABduction 5/5    Left Shoulder Internal Rotation 5/5    Left Shoulder External Rotation 5/5      Palpation   Palpation comment L levator, rhomboid, and thoracic paraspinals tender, trigger points.                           [facility] Adult PT Treatment/Exercise - 11/18/20 2111       Knee/Hip Exercises: Stretches   Other Knee/Hip Stretches hip flexor off side of table 30 sec x 2 bil;      Knee/Hip Exercises: Aerobic   Recumbent Bike L 2 x 6 min;      Knee/Hip Exercises: Standing   Forward Step Up 10 reps;Both    Forward Step Up Limitations 12 in step    Other Standing Knee  Exercises Lateral band walk Bl TB x 15; monster walk Bl TB x 15;    Other Standing Knee Exercises SL RDL 10 lb 2x10 bil;      Knee/Hip Exercises: Seated   Sit to Sand 10 reps   Single leg     Knee/Hip Exercises: Supine   Single Leg Bridge 10 reps;Both      Shoulder Exercises: Standing   Horizontal ABduction 20 reps    Theraband Level (Shoulder Horizontal ABduction) Level 4 (Blue)    Row 20 reps    Row Limitations black      Manual Therapy   Joint Mobilization thoracic PA mobs;    Soft tissue mobilization DTM/ TPR to L thoracic, rhomoboid,  levator,                          PT Long Term Goals - 11/18/20 2043       PT LONG TERM GOAL #1   Title Ind with advanced HEP    Time 6    Period Weeks    Status Partially Met      PT LONG TERM GOAL #2   Title Decreased pain in left scapula with ADLS by 42%    Time 6    Period Weeks    Status New    Target Date 12/28/20      PT LONG TERM GOAL #3   Title Able to sleep without waking from scapular pain    Time 6    Period Weeks    Status Partially Met    Target Date 12/28/20      PT LONG TERM GOAL #4   Title Improved posterior left shoulder strength to 4+ to 5/5    Time 6    Period  Weeks    Status Partially Met    Target Date 12/28/20      PT LONG TERM GOAL #5   Title decreased right knee pain with stairs and ADLS by 68% and no pain at rest.    Time 6    Period Weeks    Status Partially Met    Target Date 12/28/20      PT LONG TERM GOAL #6   Title Patient able to tolerate biking for 30 min and a return to running protocol with 3/10 pain or less.    Time 6    Period Weeks    Status Partially Met    Target Date 12/28/20      PT LONG TERM GOAL #7   Title Improved left shoulder IR to >= 60 deg to normalize function.    Time 6    Period Weeks    Status Achieved      PT LONG TERM GOAL #8   Title improved left shoulder flexion to >=165 to normalize OH ADLS.    Time 6    Period Weeks    Status Achieved                   Plan - 11/18/20 2112     Clinical Impression Statement Pt has been seen for 11 visits. He is doing well with knee strengthening, but continues to have soreness with increased activity. Low grade pain has not improved significantly. He has ongoing tightness and trigger points in L levator, rhomboid and mid thoracic region. He has had overall decrease in pain, but continues to have bothersome soreness that is variable depending on activity. He has responded well to  manual therapy and dry needling, but trigger points and pain have not resolved fully. He is showing improvments wtih scapular stability and strength and has been able to progress ther ex for this without pain. Pt is progressing well with strenght for knee and scpaular muscles, but will require continued care, for ongoing pian in thoracic region and R knee. and to meet LTGs.  Discussed getting f/u with MD due to ongoing nature of pain.    Personal Factors and Comorbidities Comorbidity 2    Comorbidities ankle sprains, left torn HS 2005    Examination-Activity Limitations Sleep;Stairs;Other   running, biking, swimming   Stability/Clinical Decision Making Stable/Uncomplicated     Rehab Potential Excellent    PT Frequency 2x / week    PT Duration 6 weeks    PT Treatment/Interventions ADLs/Self Care Home Management;Cryotherapy;Electrical Stimulation;Iontophoresis 18m/ml Dexamethasone;Moist Heat;Neuromuscular re-education;Balance training;Therapeutic exercise;Therapeutic activities;Patient/family education;Manual techniques;Dry needling;Taping;Joint Manipulations;Spinal Manipulations    PT Next Visit Plan Knee: assess functional ability; assess response to 45 sec isometrics (2 min rest); scapula: DN to levator, subscap; prone shoulder strength    PT Home Exercise Plan [email or web address]    Consulted and Agree with Plan of Care Patient             Patient will benefit from skilled therapeutic intervention in order to improve the following deficits and impairments:  Decreased range of motion, Impaired UE functional use, Pain, Decreased activity tolerance, Decreased strength, Impaired flexibility  Visit Diagnosis: Chronic pain of right knee  Chronic left shoulder pain     Problem List Patient Active Problem List   Diagnosis Date Noted   Bowel habit changes 09/22/2020   Insomnia 09/22/2020   Hypothyroid 09/22/2020   Nonallopathic lesion of thoracic region 11/25/2018   Nonallopathic lesion of sacral region 11/25/2018   Nonallopathic lesion of lumbosacral region 11/25/2018   Nonallopathic lesion of rib cage 11/25/2018   Nonallopathic lesion of cervical region 11/25/2018   Trigger point of left shoulder region 08/29/2018   Patellofemoral arthralgia of right knee 07/25/2018   Scapular dyskinesis 07/25/2018   Family history of prostate cancer in father 07/16/2018    LLyndee Hensen PT, DPT 9:36 PM  11/18/20    [place] [address]Phone: [phone]  Fax:  [phone] Name: Edward ClassMRN: [id number] of Birth: 8January 27, 1977

## 2020-11-19 ENCOUNTER — Ambulatory Visit (AMBULATORY_SURGERY_CENTER): Payer: BC Managed Care – PPO | Admitting: *Deleted

## 2020-11-19 ENCOUNTER — Encounter: Payer: Self-pay | Admitting: Gastroenterology

## 2020-11-19 VITALS — Ht 69.0 in | Wt 170.0 lb

## 2020-11-19 DIAGNOSIS — Z1211 Encounter for screening for malignant neoplasm of colon: Secondary | ICD-10-CM

## 2020-11-19 MED ORDER — CLENPIQ 10-3.5-12 MG-GM -GM/160ML PO SOLN: 1.0000 | Freq: Once | ORAL | 0 refills | Status: AC

## 2020-11-19 NOTE — Progress Notes (Signed)
Pt's previsit is done over the phone and all paperwork (prep instructions, blank consent form to just read over) sent to patient.  Pt's name and DOB verified at the beginning of the previsit.  Pt denies any difficulty with ambulating.    No trouble moving neck   No egg or soy allergy  No home oxygen use   No medications for weight loss taken  emmi information given  Pt denies constipation issues  Pt informed that we do not do prior authorizations for prep  Clen[employer]q coupon sent and pt aware of potential cost

## 2020-11-23 NOTE — Progress Notes (Signed)
Edward [name] [address] [place] Phone: [phone] Subjective:   Edward [name], am serving as a scribe for Edward [name]. This visit occurred during the SARS-CoV-2 public health emergency.  Safety protocols were in place, including screening questions prior to the visit, additional usage of staff PPE, and extensive cleaning of exam room while observing appropriate contact time as indicated for disinfecting solutions.   I'm seeing this patient by the request  of:  Edward Koch, MD  CC: Left shoulder pain, back pain  HYW:[id number]  09/21/2020 Patient did not respond extremely well to continue PRP but I do not see any significant swelling noted at the moment.  Patient continues to have pain more at the apex of the patella that is consistent with patient's MRI findings.  I do feel that patient should try formal physical therapy with no significant swelling noted.  Worsening pain only need to consider the possibility of orthopedic surgery.  Patient is in agreement with the plan.  Update 11/24/2020 Edward [name] is a 45 y.o. male coming in with complaint of R knee and L shoulder pain. Patient states that he is making improvements. Can have sharp pain with certain movements but these are decreasing.   Right knee pain is the same but feeling some improvement. Was up and down on ladder over the weekend. Uses cho-pat strap prn. Patient has run a couple of times with soreness afterwards but that is not lasting as long.   Patient is also having some left shoulder pain.  Has been working with physical therapy for more of the trigger points in the scapular dyskinesis.  Would like further evaluation potentially with an ultrasound to see if anything else is contributing.  Patient states that it is still waking him up at night and affecting daily activities.     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 Social History   Socioeconomic History   Marital status: Single    Spouse name: Not on file   Number of children: Not on file   Years of education: Not on file   Highest education level: Not on file  Occupational History   Not on file  Tobacco Use   Smoking status: Former   Smokeless tobacco: Never  Vaping Use   Vaping Use: Never used  Substance and Sexual Activity   Alcohol use: Yes    Comment: glass of wine at [facility]   Drug use: Never   Sexual activity: Not on file  Other Topics Concern   Not on file  Social History Narrative   Not on file   Social Determinants of Health   Financial Resource Strain: Not on file  Food Insecurity: Not on file  Transportation Needs: Not on file  Physical Activity: Not on file  Stress: Not on file  Social Connections: Not on file   No Known Allergies Family History  Problem Relation Age of Onset   Depression Mother    Asthma Father    Cancer Father    CAD Father    Hyperlipidemia Father    Hypertension Father    Colon cancer Neg Hx    Esophageal cancer Neg Hx    Rectal cancer Neg Hx    Stomach cancer Neg Hx  Current Outpatient Medications (Endocrine & Metabolic):    levothyroxine (SYNTHROID) 75 MCG tablet, Take 1 tablet (75 mcg total) by mouth daily before breakfast.      Current Outpatient Medications (Other):    cholecalciferol (VITAMIN D3) 25 MCG (1000 UT) tablet, Take 2,000 Units by mouth 2 (two) times a day.   traZODone (DESYREL) 50 MG tablet, TAKE 1/2 TO 1 TABLET(25 TO 50 MG) BY MOUTH AT BEDTIME AS NEEDED FOR SLEEP   Reviewed prior external information including notes and imaging from  primary care provider As well as notes that were available from care everywhere and other healthcare systems.  Past medical history, social, surgical and family history all reviewed in electronic medical record.  No pertanent information  unless stated regarding to the chief complaint.   Review of Systems:  No headache, visual changes, nausea, vomiting, diarrhea, constipation, dizziness, abdominal pain, skin rash, fevers, chills, night sweats, weight loss, swollen lymph nodes, body aches, joint swelling, chest pain, shortness of breath, mood changes. POSITIVE muscle aches  Objective  Blood pressure 134/88, pulse 82, height 5\' 9"  (1.753 m), weight 174 lb (78.9 kg), SpO2 98 %.   General: No apparent distress alert and oriented x3 mood and affect normal, dressed appropriately.  HEENT: Pupils equal, extraocular movements intact  Respiratory: Patient's speak in full sentences and does not appear short of breath  Cardiovascular: No lower extremity edema, non tender, no erythema  Gait normal with good balance and coordination.  MSK: Right knee exam shows the patient does have improvement in range of motion.  No significant swelling.  Good stability of the knee noted.  Very mild crepitus noted on exam Left shoulder exam shows the patient does have positive crossover.  Patient does have soreness with empty can but no significant weakness.  Positive crossover noted.  Limited muscular skeletal ultrasound was performed and interpreted by Edward [name], M   Limited ultrasound of patient's shoulder shows the patient does have very mild hypoechoic changes noted of the subscapularis that is consistent with a bursitis but no true acute tear appreciated.  Supraspinatus tendon appears to be unremarkable.  Effusion of the acromioclavicular joint Impression: Acromioclavicular effusion  Procedure: Real-time Ultrasound Guided Injection of left acromioclavicular joint Device: GE Logiq Q7 Ultrasound guided injection is preferred based studies that show increased duration, increased effect, greater accuracy, decreased procedural pain, increased response rate, and decreased cost with ultrasound guided versus blind injection.  Verbal informed consent  obtained.  Time-out conducted.  Noted no overlying erythema, induration, or other signs of local infection.  Skin prepped in a sterile fashion.  Local anesthesia: Topical Ethyl chloride.  With sterile technique and under real time ultrasound guidance: With a 25-gauge half inch needle injected with 0.5 cc of 0.5% Marcaine and 0.5 cc of Kenalog 40 mg/mL Completed without difficulty  Advised to call if fevers/chills, erythema, induration, drainage, or persistent bleeding.  Impression: Technically successful ultrasound guided injection.  Osteopathic findings C4 flexed rotated and side bent left C6 flexed rotated and side bent left T3 extended rotated and side bent left inhaled third rib L2 flexed rotated and side bent right Sacrum right on right    Impression and Recommendations:    The above documentation has been reviewed and is accurate and complete Lyndal Pulley, DO

## 2020-11-24 ENCOUNTER — Ambulatory Visit: Payer: Self-pay

## 2020-11-24 ENCOUNTER — Other Ambulatory Visit: Payer: Self-pay

## 2020-11-24 ENCOUNTER — Encounter: Payer: Self-pay | Admitting: Family Medicine

## 2020-11-24 ENCOUNTER — Ambulatory Visit: Payer: BC Managed Care – PPO | Admitting: Family Medicine

## 2020-11-24 VITALS — BP 134/88 | HR 82 | Ht 69.0 in | Wt 174.0 lb

## 2020-11-24 DIAGNOSIS — M9901 Segmental and somatic dysfunction of cervical region: Secondary | ICD-10-CM

## 2020-11-24 DIAGNOSIS — M9908 Segmental and somatic dysfunction of rib cage: Secondary | ICD-10-CM | POA: Diagnosis not present

## 2020-11-24 DIAGNOSIS — M9904 Segmental and somatic dysfunction of sacral region: Secondary | ICD-10-CM | POA: Diagnosis not present

## 2020-11-24 DIAGNOSIS — M25512 Pain in left shoulder: Secondary | ICD-10-CM

## 2020-11-24 DIAGNOSIS — M25561 Pain in right knee: Secondary | ICD-10-CM

## 2020-11-24 DIAGNOSIS — M999 Biomechanical lesion, unspecified: Secondary | ICD-10-CM

## 2020-11-24 DIAGNOSIS — M9903 Segmental and somatic dysfunction of lumbar region: Secondary | ICD-10-CM | POA: Diagnosis not present

## 2020-11-24 DIAGNOSIS — M9902 Segmental and somatic dysfunction of thoracic region: Secondary | ICD-10-CM

## 2020-11-24 DIAGNOSIS — M25519 Pain in unspecified shoulder: Secondary | ICD-10-CM | POA: Insufficient documentation

## 2020-11-24 NOTE — Assessment & Plan Note (Signed)
Patient given injection and tolerated the procedure well, discussed icing regimen and home exercises.  Discussed icing regimen and home exercises.  Increase activity slowly.  If patient continues to have pain with pain being greater than 6 months at this time I would consider the possibility of advanced imaging.

## 2020-11-24 NOTE — Assessment & Plan Note (Signed)
Improvement noted at this moment.  No significant change in management.  Patient will increase activity as tolerated

## 2020-11-24 NOTE — Patient Instructions (Signed)
Work on [employer] joint  Did decent with manipulation Knee is great See me in 6-8 weeks

## 2020-11-24 NOTE — Assessment & Plan Note (Signed)

## 2020-11-25 ENCOUNTER — Encounter: Payer: Self-pay | Admitting: Physical Therapy

## 2020-11-25 ENCOUNTER — Ambulatory Visit: Payer: BC Managed Care – PPO | Admitting: Physical Therapy

## 2020-11-25 DIAGNOSIS — M25512 Pain in left shoulder: Secondary | ICD-10-CM

## 2020-11-25 DIAGNOSIS — M25561 Pain in right knee: Secondary | ICD-10-CM | POA: Diagnosis not present

## 2020-11-25 DIAGNOSIS — G8929 Other chronic pain: Secondary | ICD-10-CM

## 2020-11-25 NOTE — Therapy (Signed)
[facility] [address] [place], [place], [address] Phone: [phone]   Fax:  [phone]  Physical Therapy Treatment  Patient Details  Name: Edward [name] MRN: [id number] Date of Birth: 11-Nov-1975 Referring Provider (PT): Hulan Saas DO   Encounter Date: 11/25/2020   PT End of Session - 11/25/20 1019     Visit Number 13    Number of Visits 20    Date for PT Re-Evaluation 12/28/20    Authorization Type BCBS    recert done at visit 11    PT Start Time 0920    PT Stop Time 1010    PT Time Calculation (min) 50 min    Activity Tolerance Patient tolerated treatment well    Behavior During Therapy [facility] for tasks assessed/performed             Past Medical History:  Diagnosis Date   Arthritis    OA right knee   Kidney stones    has a horse shoe kidney   Thyroid disease     History reviewed. No pertinent surgical history.  There were no vitals filed for this visit.   Subjective Assessment - 11/25/20 1017     Subjective Pt had f/u with MD, did have injection of L AC joint. Good report of knee and thoracic pain. Pt still feels "tight and sore" in shoulder/thoracic spine.    Currently in Pain? Yes    Pain Score 2    Pain Location Shoulder    Pain Orientation Left    Pain Descriptors / Indicators Aching    Pain Type Chronic pain    Pain Onset More than a month ago    Pain Frequency Intermittent                               [facility] Adult PT Treatment/Exercise - 11/25/20 0001       Shoulder Exercises: Standing   External Rotation 20 reps    Theraband Level (Shoulder External Rotation) Level 4 (Blue)    Retraction Limitations Low Row GTB x 20;    Other Standing Exercises Wall angels x 15;      Shoulder Exercises: ROM/Strengthening   UBE (Upper Arm Bike) L1 x 5 min bwd      Shoulder Exercises: Stretch   Corner Stretch 3 reps;30 seconds    Corner Stretch Limitations Doorway 60 and 90 deg;     Other Shoulder Stretches Supine pec stretch 30 sec x 2;      Modalities   Modalities Moist Heat      Moist Heat Therapy   Number Minutes Moist Heat 10 Minutes    Moist Heat Location Shoulder      Manual Therapy   Manual therapy comments skilled palpation and monitoring of soft tissue with dry needling.    Joint Mobilization thoracic PA mobs;    Soft tissue mobilization DTM/ TPR to L thoracic, rhomoboid,  levator,              Trigger Point Dry Needling - 11/25/20 0001     Consent Given? Yes    Education Handout Provided Previously provided    Levator Scapulae Response Twitch response elicited;Palpable increased muscle length   L                       PT Long Term Goals - 11/18/20 2043       PT LONG  TERM GOAL #1   Title Ind with advanced HEP    Time 6    Period Weeks    Status Partially Met      PT LONG TERM GOAL #2   Title Decreased pain in left scapula with ADLS by 24%    Time 6    Period Weeks    Status New    Target Date 12/28/20      PT LONG TERM GOAL #3   Title Able to sleep without waking from scapular pain    Time 6    Period Weeks    Status Partially Met    Target Date 12/28/20      PT LONG TERM GOAL #4   Title Improved posterior left shoulder strength to 4+ to 5/5    Time 6    Period Weeks    Status Partially Met    Target Date 12/28/20      PT LONG TERM GOAL #5   Title decreased right knee pain with stairs and ADLS by 26% and no pain at rest.    Time 6    Period Weeks    Status Partially Met    Target Date 12/28/20      PT LONG TERM GOAL #6   Title Patient able to tolerate biking for 30 min and a return to running protocol with 3/10 pain or less.    Time 6    Period Weeks    Status Partially Met    Target Date 12/28/20      PT LONG TERM GOAL #7   Title Improved left shoulder IR to >= 60 deg to normalize function.    Time 6    Period Weeks    Status Achieved      PT LONG TERM GOAL #8   Title improved left shoulder  flexion to >=165 to normalize OH ADLS.    Time 6    Period Weeks    Status Achieved                   Plan - 11/25/20 1026     Clinical Impression Statement Pt with most tension in levator today, addressed with Dry needling and TPR. He also has soreness along mid t-spine. Mobilization and strengthening for shoulder today, pt with no pain reproduction with this. Discussed stretching for anterior shoulder and pec. Pt to benefit from continued care, for pain and muscle tension relief for t-spine, shoulder, and to review best strengthening for knee.    Personal Factors and Comorbidities Comorbidity 2    Comorbidities ankle sprains, left torn HS 2005    Examination-Activity Limitations Sleep;Stairs;Other   running, biking, swimming   Stability/Clinical Decision Making Stable/Uncomplicated    Rehab Potential Excellent    PT Frequency 2x / week    PT Duration 6 weeks    PT Treatment/Interventions ADLs/Self Care Home Management;Cryotherapy;Electrical Stimulation;Iontophoresis 54m/ml Dexamethasone;Moist Heat;Neuromuscular re-education;Balance training;Therapeutic exercise;Therapeutic activities;Patient/family education;Manual techniques;Dry needling;Taping;Joint Manipulations;Spinal Manipulations    PT Next Visit Plan Knee: assess functional ability; assess response to 45 sec isometrics (2 min rest); scapula: DN to levator, subscap; prone shoulder strength    PT Home Exercise Plan [id number]    Consulted and Agree with Plan of Care Patient             Patient will benefit from skilled therapeutic intervention in order to improve the following deficits and impairments:  Decreased range of motion, Impaired UE functional use, Pain, Decreased activity tolerance, Decreased strength, Impaired flexibility  Visit Diagnosis: Chronic pain of right knee  Chronic left shoulder pain     Problem List Patient Active Problem List   Diagnosis Date Noted   AC joint pain 11/24/2020   Bowel habit  changes 09/22/2020   Insomnia 09/22/2020   Hypothyroid 09/22/2020   Nonallopathic lesion of thoracic region 11/25/2018   Nonallopathic lesion of sacral region 11/25/2018   Nonallopathic lesion of lumbosacral region 11/25/2018   Nonallopathic lesion of rib cage 11/25/2018   Nonallopathic lesion of cervical region 11/25/2018   Trigger point of left shoulder region 08/29/2018   Patellofemoral arthralgia of right knee 07/25/2018   Scapular dyskinesis 07/25/2018   Family history of prostate cancer in father 07/16/2018    Lyndee Hensen, PT, DPT 10:32 AM  11/25/20    [facility] [place], [place], [address] Phone: [phone]   Fax:  [phone]  Name: Redell Nazir MRN: [id number] Date of Birth: 1975/08/20

## 2020-12-01 ENCOUNTER — Encounter: Payer: BC Managed Care – PPO | Admitting: Physical Therapy

## 2020-12-02 ENCOUNTER — Encounter: Payer: BC Managed Care – PPO | Admitting: Physical Therapy

## 2020-12-03 ENCOUNTER — Encounter: Payer: Self-pay | Admitting: Certified Registered Nurse Anesthetist

## 2020-12-04 ENCOUNTER — Other Ambulatory Visit: Payer: Self-pay

## 2020-12-04 ENCOUNTER — Ambulatory Visit (AMBULATORY_SURGERY_CENTER): Payer: BC Managed Care – PPO | Admitting: Gastroenterology

## 2020-12-04 ENCOUNTER — Encounter: Payer: Self-pay | Admitting: Gastroenterology

## 2020-12-04 ENCOUNTER — Other Ambulatory Visit: Payer: BC Managed Care – PPO

## 2020-12-04 ENCOUNTER — Other Ambulatory Visit: Payer: Self-pay | Admitting: Gastroenterology

## 2020-12-04 VITALS — BP 112/80 | HR 64 | Temp 97.5°F | Resp 11 | Ht 69.0 in | Wt 170.0 lb

## 2020-12-04 DIAGNOSIS — K6289 Other specified diseases of anus and rectum: Secondary | ICD-10-CM

## 2020-12-04 DIAGNOSIS — Z1211 Encounter for screening for malignant neoplasm of colon: Secondary | ICD-10-CM

## 2020-12-04 DIAGNOSIS — C20 Malignant neoplasm of rectum: Secondary | ICD-10-CM

## 2020-12-04 MED ORDER — SODIUM CHLORIDE 0.9 % IV SOLN: 500.0000 mL | Freq: Once | INTRAVENOUS | Status: DC

## 2020-12-04 NOTE — Patient Instructions (Addendum)
CT scan, keep instructions to fill in when they call to make the appt.  CEA today.  YOU HAD AN ENDOSCOPIC PROCEDURE TODAY AT [facility]:   Refer to the procedure report that was given to you for any specific questions about what was found during the examination.  If the procedure report does not answer your questions, please call your gastroenterologist to clarify.  If you requested that your care partner not be given the details of your procedure findings, then the procedure report has been included in a sealed envelope for you to review at your convenience later.  YOU SHOULD EXPECT: Some feelings of bloating in the abdomen. Passage of more gas than usual.  Walking can help get rid of the air that was put into your GI tract during the procedure and reduce the bloating. If you had a lower endoscopy (such as a colonoscopy or flexible sigmoidoscopy) you may notice spotting of blood in your stool or on the toilet paper. If you underwent a bowel prep for your procedure, you may not have a normal bowel movement for a few days.  Please Note:  You might notice some irritation and congestion in your nose or some drainage.  This is from the oxygen used during your procedure.  There is no need for concern and it should clear up in a day or so.  SYMPTOMS TO REPORT IMMEDIATELY:  Following lower endoscopy (colonoscopy):  Excessive amounts of blood in the stool  Significant tenderness or worsening of abdominal pains  Swelling of the abdomen that is new, acute  Fever of 100F or higher  For urgent or emergent issues, a gastroenterologist can be reached at any hour by calling [phone]. Do not use MyChart messaging for urgent concerns.    DIET:  We do recommend a small meal at first, but then you may proceed to your regular diet.  Drink plenty of fluids but you should avoid alcoholic beverages for 24 hours.  ACTIVITY:  You should plan to take it easy for the rest of today and you  should NOT DRIVE or use heavy machinery until tomorrow (because of the sedation medicines used during the test).    FOLLOW UP: Our staff will call the number listed on your records 48-72 hours following your procedure to check on you and address any questions or concerns that you may have regarding the information given to you following your procedure. If we do not reach you, we will leave a message.  We will attempt to reach you two times.  During this call, we will ask if you have developed any symptoms of COVID 19. If you develop any symptoms (ie: fever, flu-like symptoms, shortness of breath, cough etc.) before then, please call [phone].  If you test positive for Covid 19 in the 2 weeks post procedure, please call and report this information to [place].    If any biopsies were taken you will be contacted by phone or by letter within the next 1-3 weeks.  Please call us at [phone] if you have not heard about the biopsies in 3 weeks.    SIGNATURES/CONFIDENTIALITY: You and/or your care partner have signed paperwork which will be entered into your electronic medical record.  These signatures attest to the fact that that the information above on your After Visit Summary has been reviewed and is understood.  Full responsibility of the confidentiality of this discharge information lies with you and/or your care-partner.

## 2020-12-04 NOTE — Progress Notes (Signed)
VS by AS  Pt's states no medical or surgical changes since previsit or office visit.  

## 2020-12-04 NOTE — Op Note (Signed)
[facility] Patient Name: Edward [name] Procedure Date: 12/04/2020 8:41 AM MRN: [id number] Endoscopist: Gerrit Heck , MD Age: 45 Referring MD:  Date of Birth: 1975-12-05 Gender: Male Account #: [id number] Procedure:                Colonoscopy Indications:              Screening for colorectal malignant neoplasm, This                            is the patient's first colonoscopy                           He does report recent onset changes in bowel habits                            and intermittent hematochezia. Medicines:                Monitored Anesthesia Care Procedure:                Pre-Anesthesia Assessment:                           - Prior to the procedure, a History and Physical                            was performed, and patient medications and                            allergies were reviewed. The patient's tolerance of                            previous anesthesia was also reviewed. The risks                            and benefits of the procedure and the sedation                            options and risks were discussed with the patient.                            All questions were answered, and informed consent                            was obtained. Prior Anticoagulants: The patient has                            taken no previous anticoagulant or antiplatelet                            agents. ASA Grade Assessment: II - A patient with                            mild systemic disease. After reviewing the risks  and benefits, the patient was deemed in                            satisfactory condition to undergo the procedure.                           After obtaining informed consent, the colonoscope                            was passed under direct vision. Throughout the                            procedure, the patient's blood pressure, pulse, and                            oxygen saturations were monitored  continuously. The                            CF [id number] #[id number] was introduced through the anus                            and advanced to the the terminal ileum. The                            colonoscopy was performed without difficulty. The                            patient tolerated the procedure well. The quality                            of the bowel preparation was good. The terminal                            ileum, ileocecal valve, appendiceal orifice, and                            rectum were photographed. Scope In: 8:57:55 AM Scope Out: 9:12:06 AM Scope Withdrawal Time: 0 hours 11 minutes 51 seconds  Total Procedure Duration: 0 hours 14 minutes 11 seconds  Findings:                 The digital rectal exam revealed a firm rectal mass.                           A frond-like/villous partially obstructing mass was                            found in the rectum, with the distal side located                            approximately 5 cm from the anal verge. The mass                            was partially circumferential (involving two-thirds  of the lumen circumference). The mass measured                            three cm in length. Oozing was present. This was                            biopsied with a cold forceps for histology.                            Estimated blood loss was minimal.                           The exam was otherwise normal throughout the                            remainder of the colon.                           The terminal ileum appeared normal.                           Retroflexion in the rectum was not performed due to                            the location of the mass. Complications:            No immediate complications. Estimated Blood Loss:     Estimated blood loss was minimal. 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                            Biopsied.                           - The remainder of the colon was normal appearing.                           - The examined portion of the ileum was normal. Recommendation:           - Patient has a contact number available for                            emergencies. The signs and symptoms of potential                            delayed complications were discussed with the                            patient. Return to normal activities tomorrow.  Written discharge instructions were provided to the                            patient.                           - Resume previous diet.                           - Continue present medications.                           - Await pathology results.                           - Perform a CT scan (computed tomography) of chest,                            abdomen,and pelvis with contrast at appointment to                            be scheduled.                           - Perform magnetic resonance imaging (MRI) Pelvis                            with gadolinium at appointment to be scheduled.                           - Refer to a colo-rectal surgeon at appointment to                            be scheduled.                           - Check CEA.                           - Return to GI clinic in 2-4 weeks. Gerrit Heck, MD 12/04/2020 9:25:09 AM

## 2020-12-04 NOTE — Progress Notes (Signed)
GASTROENTEROLOGY PROCEDURE H&P NOTE   Primary Care Physician: Hoyt Koch, MD    Reason for Procedure:  Colon Cancer screening  Plan:    Colonoscopy  Patient is appropriate for endoscopic procedure(s) in the ambulatory ([facility]) setting.  The nature of the procedure, as well as the risks, benefits, and alternatives were carefully and thoroughly reviewed with the patient. Ample time for discussion and questions allowed. The patient understood, was satisfied, and agreed to proceed.     HPI: Edward [name] is a 45 y.o. male who presents for colonoscopy for routine Colon Cancer screening.  Does report recent changes in bowel habits, described as fecal urgency, loose stools, alternating with constipation. Some improvement since starting Metamucil. Also with symptomatic hemorrhoids with intermittent hematochezia.  No known family history of colon cancer or related malignancy.  Patient is otherwise without complaints or active issues today.  Past Medical History:  Diagnosis Date   Arthritis    OA right knee   Kidney stones    has a horse shoe kidney   Thyroid disease     History reviewed. No pertinent surgical history.  Prior to Admission medications   Medication Sig Start Date End Date Taking? Authorizing Provider  cholecalciferol (VITAMIN D3) 25 MCG (1000 UT) tablet Take 2,000 Units by mouth 2 (two) times a day.   Yes [provider]  levothyroxine (SYNTHROID) 75 MCG tablet Take 1 tablet (75 mcg total) by mouth daily before breakfast. 09/22/20  Yes Hoyt Koch, MD  traZODone (DESYREL) 50 MG tablet TAKE 1/2 TO 1 TABLET(25 TO 50 MG) BY MOUTH AT BEDTIME AS NEEDED FOR SLEEP 09/22/20  Yes Hoyt Koch, MD    Current Outpatient Medications  Medication Sig Dispense Refill   cholecalciferol (VITAMIN D3) 25 MCG (1000 UT) tablet Take 2,000 Units by mouth 2 (two) times a day.     levothyroxine (SYNTHROID) 75 MCG tablet Take 1 tablet (75 mcg total) by mouth  daily before breakfast. 90 tablet 3   traZODone (DESYREL) 50 MG tablet TAKE 1/2 TO 1 TABLET(25 TO 50 MG) BY MOUTH AT BEDTIME AS NEEDED FOR SLEEP 45 tablet 2   Current Facility-Administered Medications  Medication Dose Route Frequency Provider Last Rate Last Admin   0.9 %  sodium chloride infusion  500 mL Intravenous Once Lynard Postlewait V, DO        Allergies as of 12/04/2020   (No Known Allergies)    Family History  Problem Relation Age of Onset   Depression Mother    Asthma Father    Cancer Father    CAD Father    Hyperlipidemia Father    Hypertension Father    Colon cancer Neg Hx    Esophageal cancer Neg Hx    Rectal cancer Neg Hx    Stomach cancer Neg Hx     Social History   Socioeconomic History   Marital status: Single    Spouse name: Not on file   Number of children: Not on file   Years of education: Not on file   Highest education level: Not on file  Occupational History   Not on file  Tobacco Use   Smoking status: Former   Smokeless tobacco: Never  Vaping Use   Vaping Use: Never used  Substance and Sexual Activity   Alcohol use: Yes    Comment: glass of wine at noc   Drug use: Never   Sexual activity: Not on file  Other Topics Concern   Not on  file  Social History Narrative   Not on file   Social Determinants of Health   Financial Resource Strain: Not on file  Food Insecurity: Not on file  Transportation Needs: Not on file  Physical Activity: Not on file  Stress: Not on file  Social Connections: Not on file  Intimate Partner Violence: Not on file    Physical Exam: Vital signs in last 24 hours: @BP  116/72   Pulse 66   Temp (!) 97.5 F (36.4 C)   Resp 13   Ht 5\' 9"  (1.753 m)   Wt 170 lb (77.1 kg)   SpO2 99%   BMI 25.10 kg/m  GEN: NAD EYE: Sclerae anicteric ENT: MMM CV: Non-tachycardic Pulm: CTA b/l GI: Soft, NT/ND NEURO:  Alert & Oriented x 3   Gerrit Heck, DO [facility]   12/04/2020 8:50 AM

## 2020-12-04 NOTE — Progress Notes (Signed)
Called to room to assist during endoscopic procedure.  Patient ID and intended procedure confirmed with present staff. Received instructions for my participation in the procedure from the performing physician.  

## 2020-12-04 NOTE — Progress Notes (Signed)
Report given to PACU, vss 

## 2020-12-05 LAB — CEA: CEA: 0.6 ng/mL

## 2020-12-07 ENCOUNTER — Telehealth: Payer: Self-pay

## 2020-12-07 DIAGNOSIS — K6289 Other specified diseases of anus and rectum: Secondary | ICD-10-CM

## 2020-12-07 NOTE — Telephone Encounter (Signed)
Per 12/04/20 procedure report:  - Perform a CT scan chest/abdomen/pelvis with contrast - Perform MRI pelvis with Gadolinium - Refer to colo-rectal surgeon  - Check CEA level - patient completed on 10/21 - Return to GI office in 2-4 weeks  Urgent referral, records, demographic and insurance information faxed to [facility].  Imaging orders in epic, verifying with Dr. Bryan Lemma. Patient is scheduled for a follow up with Dr. Bryan Lemma on Wednesday, 12/30/20 at 9:40 am.

## 2020-12-08 ENCOUNTER — Telehealth: Payer: Self-pay

## 2020-12-08 ENCOUNTER — Telehealth: Payer: Self-pay | Admitting: *Deleted

## 2020-12-08 NOTE — Telephone Encounter (Signed)
Left message on follow up call. 

## 2020-12-08 NOTE — Telephone Encounter (Signed)
Spoke with pt and he is aware of OV and knows he will be contacted by [facility] regarding an appt and by rad scheduling to set up the CT and MRI. Pt given the address of [place] [facility].

## 2020-12-08 NOTE — Telephone Encounter (Signed)
  Follow up Call-  Call back number 12/04/2020  Post procedure Call Back phone  # [phone]  Permission to leave phone message Yes    No answer at 2nd attempt follow up phone call.  Left message on voicemail.

## 2020-12-10 ENCOUNTER — Telehealth: Payer: Self-pay | Admitting: Gastroenterology

## 2020-12-10 ENCOUNTER — Telehealth: Payer: Self-pay | Admitting: Licensed Clinical Social Worker

## 2020-12-10 ENCOUNTER — Other Ambulatory Visit: Payer: Self-pay

## 2020-12-10 ENCOUNTER — Ambulatory Visit: Payer: BC Managed Care – PPO | Admitting: Physical Therapy

## 2020-12-10 ENCOUNTER — Encounter: Payer: Self-pay | Admitting: Physical Therapy

## 2020-12-10 DIAGNOSIS — M25561 Pain in right knee: Secondary | ICD-10-CM

## 2020-12-10 DIAGNOSIS — M25512 Pain in left shoulder: Secondary | ICD-10-CM

## 2020-12-10 DIAGNOSIS — G8929 Other chronic pain: Secondary | ICD-10-CM

## 2020-12-10 DIAGNOSIS — C20 Malignant neoplasm of rectum: Secondary | ICD-10-CM

## 2020-12-10 NOTE — Telephone Encounter (Signed)
I called the patient to discuss biopsy results, which demonstrate Rectal Adenocarcinoma.  Additional staining to be done by Pathology.  We discussed the results at length today by phone and all questions answered to the best of my ability.  He has an appointment set up in the Colorectal Surgery clinic for evaluation.  CT C/A/P and MRI pelvis ordered, but not yet scheduled.  CEA was normal.  He is also trying to coordinate for an appointment with Medical Oncology at [facility] for evaluation.  - Patient planning to call over to [facility] today to schedule imaging studies.  Can we please assist patient in coordinating the studies if he needs help - Please place a referral to the [facility] given emerging data for genetics evaluation colon cancer diagnosed in young patients  Can follow-up with me as needed after coordinating with medical and surgical oncology.

## 2020-12-10 NOTE — Telephone Encounter (Signed)
Referral placed for genetics.  

## 2020-12-10 NOTE — Telephone Encounter (Signed)
Scheduled appt per 10/27 referral. Pt is aware of appt date and time.  

## 2020-12-10 NOTE — Addendum Note (Signed)
Addended by: Lanny Hurst A on: 12/10/2020 09:05 AM   Modules accepted: Orders

## 2020-12-11 ENCOUNTER — Encounter: Payer: Self-pay | Admitting: Physical Therapy

## 2020-12-11 ENCOUNTER — Encounter: Payer: Self-pay | Admitting: Gastroenterology

## 2020-12-11 NOTE — Therapy (Signed)
[facility] [address] [place], [place], [address] Phone: [phone]   Fax:  [phone]  Physical Therapy Treatment/Discharge  Patient Details  Name: Edward [name] MRN: [id number] Date of Birth: March 07, 1975 Referring Provider (PT): Hulan Saas DO   Encounter Date: 12/10/2020   PT End of Session - 12/11/20 1001     Visit Number 14    Number of Visits 20    Date for PT Re-Evaluation 12/28/20    Authorization Type BCBS    recert done at visit 11    PT Start Time 0930    PT Stop Time 1017    PT Time Calculation (min) 47 min    Activity Tolerance Patient tolerated treatment well    Behavior During Therapy [facility] for tasks assessed/performed             Past Medical History:  Diagnosis Date   Arthritis    OA right knee   Kidney stones    has a horse shoe kidney   Thyroid disease     History reviewed. No pertinent surgical history.  There were no vitals filed for this visit.   Subjective Assessment - 12/10/20 0946     Subjective Pt has new news last week, of results from colonoscopy, he does have a cancerous mass in colon and will be seeing Dr soon for follow up and treatment plan. He continues to have tightess and soreness in L shoulder blade. He also has soreness in knee today, states still sore from trying to run last week. Knee doing ok, but is getting sore with increased activity.    Patient Stated Goals decreased pain    Currently in Pain? Yes    Pain Score 3     Pain Location Knee    Pain Orientation Right    Pain Descriptors / Indicators Aching    Pain Type Chronic pain    Pain Onset More than a month ago    Pain Frequency Intermittent    Pain Score 3    Pain Location Shoulder    Pain Orientation Left    Pain Descriptors / Indicators Aching;Tightness    Pain Type Chronic pain    Pain Onset More than a month ago    Pain Frequency Intermittent                               [facility] Adult  PT Treatment/Exercise - 12/11/20 0001       Knee/Hip Exercises: Aerobic   Recumbent Bike L 2 x 6 min;      Shoulder Exercises: Standing   Horizontal ABduction 20 reps    Theraband Level (Shoulder Horizontal ABduction) Level 3 (Green)    External Rotation 20 reps    Theraband Level (Shoulder External Rotation) Level 4 (Blue)    Row 20 reps    Row Limitations black      Modalities   Modalities Moist Heat      Moist Heat Therapy   Number Minutes Moist Heat 10 Minutes    Moist Heat Location Shoulder      Manual Therapy   Manual therapy comments skilled palpation and monitoring of soft tissue with dry needling.    Joint Mobilization thoracic PA mobs;    Soft tissue mobilization DTM/ TPR to L thoracic, rhomoboid,  levator, thoracic paraspinals              Trigger Point Dry Needling - 12/11/20  0001     Consent Given? Yes    Education Handout Provided Previously provided    Muscles Treated Head and Neck Upper trapezius;Levator scapulae    Muscles Treated Back/Hip Thoracic multifidi    Upper Trapezius Response Twitch reponse elicited;Palpable increased muscle length   L   Levator Scapulae Response Twitch response elicited;Palpable increased muscle length   L   Thoracic multifidi response Palpable increased muscle length   L                  PT Education - 12/11/20 1000     Education Details reviewed HEP , best exercieses for scapular/shoulder strength and knee strength.    Person(s) Educated Patient    Methods Explanation;Demonstration;Tactile cues;Verbal cues;Handout    Comprehension Verbalized understanding;Returned demonstration;Verbal cues required;Tactile cues required                 PT Long Term Goals - 12/11/20 1001       PT LONG TERM GOAL #1   Title Ind with advanced HEP    Time 6    Period Weeks    Status Achieved      PT LONG TERM GOAL #2   Title Decreased pain in left scapula with ADLS by 08%    Time 6    Period Weeks    Status  Partially Met      PT LONG TERM GOAL #3   Title Able to sleep without waking from scapular pain    Time 6    Period Weeks    Status Achieved      PT LONG TERM GOAL #4   Title Improved posterior left shoulder strength to 4+ to 5/5    Time 6    Period Weeks    Status Achieved      PT LONG TERM GOAL #5   Title decreased right knee pain with stairs and ADLS by 65% and no pain at rest.    Time 6    Period Weeks    Status Partially Met      PT LONG TERM GOAL #6   Title Patient able to tolerate biking for 30 min and a return to running protocol with 3/10 pain or less.    Time 6    Period Weeks    Status Partially Met      PT LONG TERM GOAL #7   Title Improved left shoulder IR to >= 60 deg to normalize function.    Time 6    Period Weeks    Status Achieved      PT LONG TERM GOAL #8   Title improved left shoulder flexion to >=165 to normalize OH ADLS.    Time 6    Period Weeks    Status Achieved                   Plan - 12/11/20 1003     Clinical Impression Statement Pt has been seen for 14 visits. He states he is doing better, but soreness in L shoulderblade and R knee still persists. He is doing well with daily activities with knee, but is having increased soreness with attempts to return to running. Discussed continuing pain free strengthening, and increasing cycling vs running if there is less pain in knee. He continues to have tightness and muscle tension in L Rhomboid and thoracic paraspinals. He has responded well to manual and dry needling, but does have remaining taught band in paraspinal that is difficult to  resolve with dry needling due to orientation over lung field. He is doing well with HEP for this, and will continue to perform strengthening for scapular and shoulder muscles. Pt will be d/c at this time, due to doing weill with HEP, and having pain that has not significantly improved in the last couple weeks. Pt will also be having several upcoming  appointments for his new diagnosis, to find treatment plan. Pt in agreement with PT d/c plan. Final HEP reviewed today.    Personal Factors and Comorbidities Comorbidity 2    Comorbidities ankle sprains, left torn HS 2005    Examination-Activity Limitations Sleep;Stairs;Other   running, biking, swimming   Stability/Clinical Decision Making Stable/Uncomplicated    Rehab Potential Excellent    PT Frequency 2x / week    PT Duration 6 weeks    PT Treatment/Interventions ADLs/Self Care Home Management;Cryotherapy;Electrical Stimulation;Iontophoresis 81m/ml Dexamethasone;Moist Heat;Neuromuscular re-education;Balance training;Therapeutic exercise;Therapeutic activities;Patient/family education;Manual techniques;Dry needling;Taping;Joint Manipulations;Spinal Manipulations    PT Next Visit Plan Knee: assess functional ability; assess response to 45 sec isometrics (2 min rest); scapula: DN to levator, subscap; prone shoulder strength    PT Home Exercise Plan [email or web address]    Consulted and Agree with Plan of Care Patient             Patient will benefit from skilled therapeutic intervention in order to improve the following deficits and impairments:  Decreased range of motion, Impaired UE functional use, Pain, Decreased activity tolerance, Decreased strength, Impaired flexibility  Visit Diagnosis: Chronic pain of right knee  Chronic left shoulder pain     Problem List Patient Active Problem List   Diagnosis Date Noted   [facility]   Bowel habit changes 09/22/2020   Insomnia 09/22/2020   Hypothyroid 09/22/2020   Nonallopathic lesion of thoracic region 11/25/2018   Nonallopathic lesion of sacral region 11/25/2018   Nonallopathic lesion of lumbosacral region 11/25/2018   Nonallopathic lesion of rib cage 11/25/2018   Nonallopathic lesion of cervical region 11/25/2018   Trigger point of left shoulder region 08/29/2018   Patellofemoral arthralgia of right knee 07/25/2018   Scapular  dyskinesis 07/25/2018   Family history of prostate cancer in father 07/16/2018   LLyndee Hensen PT, DPT 10:07 AM  12/11/20    [facility] [place] [address]Phone: [phone]  Fax:  [phone] Name: SJayde DaffinMRN: [id number] of Birth: [birth year]   PHYSICAL THERAPY DISCHARGE SUMMARY  Visits from Start of Care: 14  Plan: Patient agrees to discharge.  Patient goals were partially met. Patient is being discharged due to meeting the stated rehab goals.     LLyndee Hensen PT, DPT 10:08 AM  12/11/20

## 2020-12-14 ENCOUNTER — Encounter: Payer: Self-pay | Admitting: Licensed Clinical Social Worker

## 2020-12-14 ENCOUNTER — Inpatient Hospital Stay: Payer: BC Managed Care – PPO | Attending: Internal Medicine | Admitting: Licensed Clinical Social Worker

## 2020-12-14 ENCOUNTER — Other Ambulatory Visit: Payer: Self-pay

## 2020-12-14 ENCOUNTER — Inpatient Hospital Stay: Payer: BC Managed Care – PPO

## 2020-12-14 DIAGNOSIS — Z8042 Family history of malignant neoplasm of prostate: Secondary | ICD-10-CM | POA: Insufficient documentation

## 2020-12-14 DIAGNOSIS — C20 Malignant neoplasm of rectum: Secondary | ICD-10-CM | POA: Diagnosis not present

## 2020-12-14 LAB — GENETIC SCREENING ORDER

## 2020-12-14 NOTE — Progress Notes (Signed)
REFERRING PROVIDER: Lavena Bullion, DO [address] [place],  [place] [address]  PRIMARY PROVIDER:  Hoyt Koch, MD  PRIMARY REASON FOR VISIT:  1. Malignant neoplasm of rectum ([place])   2. Family history of prostate cancer      HISTORY OF PRESENT ILLNESS:   Edward [name], a 45 y.o. male, was seen for a [facility] cancer genetics consultation at the request of Dr. Bryan Lemma due to a personal and family history of cancer.   Edward [name] presents to clinic today to discuss the possibility of a hereditary predisposition to cancer, genetic testing, and to further clarify his future cancer risks, as well as potential cancer risks for family members.   In 2022, at the age of 71, Edward [name] was diagnosed with rectal adenocarcinoma via rectal biopsy. The treatment plan is still being determined.   CANCER HISTORY:  Oncology History   No history exists.    Past Medical History:  Diagnosis Date   Arthritis    OA right knee   Family history of prostate cancer    Kidney stones    has a horse shoe kidney   Thyroid disease     No past surgical history on file.  Social History   Socioeconomic History   Marital status: Single    Spouse name: Not on file   Number of children: Not on file   Years of education: Not on file   Highest education level: Not on file  Occupational History   Not on file  Tobacco Use   Smoking status: Former   Smokeless tobacco: Never  Vaping Use   Vaping Use: Never used  Substance and Sexual Activity   Alcohol use: Yes    Comment: glass of wine at noc   Drug use: Never   Sexual activity: Not on file  Other Topics Concern   Not on file  Social History Narrative   Not on file   Social Determinants of Health   Financial Resource Strain: Not on file  Food Insecurity: Not on file  Transportation Needs: Not on file  Physical Activity: Not on file  Stress: Not on file  Social Connections: Not on file     FAMILY  HISTORY:  We obtained a detailed, 4-generation family history.  Significant diagnoses are listed below: Family History  Problem Relation Age of Onset   Depression Mother    Asthma Father    Cancer Father    CAD Father    Hyperlipidemia Father    Hypertension Father    Colon cancer Neg Hx    Esophageal cancer Neg Hx    Rectal cancer Neg Hx    Stomach cancer Neg Hx    Edward [name] does not have children. He has 1 sister, 32, no cancer.  Edward [name] mother is living at 69, no history of cancer. Patient has 1 maternal uncle who had prostate cancer at 10. Maternal grandfather died at 79, grandmother died at 15. Her sister, patient's great aunt, had colon cancer in her 97s-80s.  Edward [name] father had prostate cancer at 80 and is living at 65. He has also had colon polyps, unsure exact amount. He has colonoscopies every 3 years. Patient has 2 paternal aunts, no cancers. Paternal grandmother is living at 48. Grandfather had prostate cancer and died at 23, he also had Alzheimer's/other health issues that he passed from.  Edward [name] is unaware of previous family history of genetic testing for hereditary cancer risks.  Patient's maternal ancestors are of American descent, and paternal ancestors are of American descent. There is no reported Ashkenazi Jewish ancestry. There is no known consanguinity.   GENETIC COUNSELING ASSESSMENT: Mr. Hinze is a 45 y.o. male with a personal and family history of cancer which is somewhat suggestive of a hereditary cancer syndrome and predisposition to cancer. We, therefore, discussed and recommended the following at today's visit.   DISCUSSION: We discussed that approximately 10% of colorectal cancer is hereditary. Most cases of hereditary colorectal cancer are associated with Lynch syndrome genes, although there are other genes associated with hereditary cancer as well including . Cancers and risks are gene specific.  We discussed that testing is beneficial  for several reasons including knowing about other cancer risks, identifying potential screening and risk-reduction options that may be appropriate, and to understand if other family members could be at risk for cancer and allow them to undergo genetic testing.   We reviewed the characteristics, features and inheritance patterns of hereditary cancer syndromes. We also discussed genetic testing, including the appropriate family members to test, the process of testing, insurance coverage and turn-around-time for results. We discussed the implications of a negative, positive and/or variant of uncertain significant result. We recommended Edward [name] pursue genetic testing for the [facility] CancerNext-Expanded+RNA gene panel.   The CancerNext-Expanded + RNAinsight gene panel offered by [employer] and includes sequencing and rearrangement analysis for the following 77 genes: IP, ALK, APC*, ATM*, AXIN2, BAP1, BARD1, BLM, BMPR1A, BRCA1*, BRCA2*, BRIP1*, CDC73, CDH1*,CDK4, CDKN1B, CDKN2A, CHEK2*, CTNNA1, DICER1, FANCC, FH, FLCN, GALNT12, KIF1B, LZTR1, MAX, MEN1, MET, MLH1*, MSH2*, MSH3, MSH6*, MUTYH*, NBN, NF1*, NF2, NTHL1, PALB2*, PHOX2B, PMS2*, POT1, PRKAR1A, PTCH1, PTEN*, RAD51C*, RAD51D*,RB1, RECQL, RET, SDHA, SDHAF2, SDHB, SDHC, SDHD, SMAD4, SMARCA4, SMARCB1, SMARCE1, STK11, SUFU, TMEM127, TP53*,TSC1, TSC2, VHL and XRCC2 (sequencing and deletion/duplication); EGFR, EGLN1, HOXB13, KIT, MITF, PDGFRA, POLD1 and POLE (sequencing only); EPCAM and GREM1 (deletion/duplication only).  Based on Edward [name] personal and family history of cancer, he meets medical criteria for genetic testing. Despite that he meets criteria, he may still have an out of pocket cost. We discussed that if his out of pocket cost for testing is over $100, the laboratory will call and confirm whether he wants to proceed with testing.  If the out of pocket cost of testing is less than $100 he will be billed by the genetic testing laboratory.     PLAN: After considering the risks, benefits, and limitations, Edward [name] provided informed consent to pursue genetic testing and the blood sample was sent to [facility] for analysis of the CancerNext-Expanded+RNA panel. Results should be available within approximately 2-3 weeks' time, at which point they will be disclosed by telephone to Edward [name], as will any additional recommendations warranted by these results. Edward [name] will receive a summary of his genetic counseling visit and a copy of his results once available. This information will also be available in Epic.   Edward [name] questions were answered to his satisfaction today. Our contact information was provided should additional questions or concerns arise. Thank you for the referral and allowing [place] to share in the care of your patient.   Faith Rogue, MS, [facility] Genetic Counselor [email or web address] Phone: [phone]  The patient was seen for a total of 30 minutes in face-to-face genetic counseling.  Patient was seen with his Patria Mane. Dr. Grayland Ormond was available for discussion regarding this case.   _______________________________________________________________________ For Office Staff:  Number of people involved in session: 2 Was  an Intern/ student involved with case: no

## 2020-12-16 ENCOUNTER — Ambulatory Visit
Admission: RE | Admit: 2020-12-16 | Discharge: 2020-12-16 | Disposition: A | Discharge status: Home / Self Care | Payer: BC Managed Care – PPO | Source: Ambulatory Visit | Attending: Gastroenterology | Admitting: Gastroenterology

## 2020-12-16 DIAGNOSIS — K6289 Other specified diseases of anus and rectum: Secondary | ICD-10-CM

## 2020-12-16 MED ORDER — IOPAMIDOL (ISOVUE-300) INJECTION 61%
100.0000 mL | Freq: Once | INTRAVENOUS | Status: AC | PRN
  Administered 2020-12-16: 100 mL via INTRAVENOUS

## 2020-12-21 ENCOUNTER — Ambulatory Visit: Payer: BC Managed Care – PPO | Admitting: Internal Medicine

## 2020-12-21 ENCOUNTER — Ambulatory Visit: Payer: BC Managed Care – PPO | Admitting: Family Medicine

## 2020-12-22 ENCOUNTER — Telehealth: Payer: Self-pay

## 2020-12-22 NOTE — Telephone Encounter (Signed)
Patient notified of results via my chart. See patient message "additional pathology results" for information.

## 2020-12-22 NOTE — Telephone Encounter (Signed)
-----  Message from [place], DO sent at 12/21/2020  5:30 PM EST ----- I received the results from the Pathologist.  Additional staining demonstrates lesion to be microsatellite stable.  This is a good finding.  Can you please convey this result to the patient?  Thank you.

## 2020-12-23 NOTE — Telephone Encounter (Signed)
Results for the additional immunohistochemical staining are now in the chart for review.

## 2020-12-24 NOTE — Telephone Encounter (Signed)
No, unfortunately the small sample size during biopsies taken during colonoscopy do not typically come with tumor grading.  Tumor staging, however, it is extremely important when it comes to identifying the best therapy.  This was done using CT and MRI.  Hope this is helpful.

## 2020-12-25 ENCOUNTER — Other Ambulatory Visit: Payer: BC Managed Care – PPO

## 2020-12-28 ENCOUNTER — Telehealth: Payer: Self-pay | Admitting: Licensed Clinical Social Worker

## 2020-12-28 ENCOUNTER — Telehealth: Payer: Self-pay | Admitting: General Surgery

## 2020-12-28 ENCOUNTER — Encounter: Payer: Self-pay | Admitting: Licensed Clinical Social Worker

## 2020-12-28 ENCOUNTER — Ambulatory Visit: Payer: Self-pay | Admitting: Licensed Clinical Social Worker

## 2020-12-28 DIAGNOSIS — Z1379 Encounter for other screening for genetic and chromosomal anomalies: Secondary | ICD-10-CM | POA: Insufficient documentation

## 2020-12-28 DIAGNOSIS — Z8042 Family history of malignant neoplasm of prostate: Secondary | ICD-10-CM

## 2020-12-28 DIAGNOSIS — C20 Malignant neoplasm of rectum: Secondary | ICD-10-CM

## 2020-12-28 NOTE — Telephone Encounter (Signed)
Revealed negative genetic testing.  This normal result is reassuring and indicates that it is unlikely Mr. [place] cancer is due to a hereditary cause.  It is unlikely that there is an increased risk of another cancer due to a mutation in one of these genes.  However, genetic testing is not perfect, and cannot definitively rule out a hereditary cause.  It will be important for him to keep in contact with genetics to learn if any additional testing may be needed in the future.

## 2020-12-28 NOTE — Telephone Encounter (Signed)
Referral placed for behavior health

## 2020-12-28 NOTE — Progress Notes (Signed)
HPI:  [name] was previously seen in the [facility] due to a personal and family history of cancer and concerns regarding a hereditary predisposition to cancer. Please refer to our prior cancer genetics clinic note for more information regarding our discussion, assessment and recommendations, at the time. [name] recent genetic test results were disclosed to him, as were recommendations warranted by these results. These results and recommendations are discussed in more detail below.  CANCER HISTORY:  Oncology History   No history exists.    FAMILY HISTORY:  We obtained a detailed, 4-generation family history.  Significant diagnoses are listed below: Family History  Problem Relation Age of Onset   Depression Mother    Asthma Father    Cancer Father    CAD Father    Hyperlipidemia Father    Hypertension Father    Colon cancer Neg Hx    Esophageal cancer Neg Hx    Rectal cancer Neg Hx    Stomach cancer Neg Hx     [name] does not have children. He has 1 sister, 17, no cancer.   [name] mother is living at 28, no history of cancer. Patient has 1 maternal uncle who had prostate cancer at 22. Maternal grandfather died at 89, grandmother died at 73. Her sister, patient's great aunt, had colon cancer in her 63s-80s.   [name] father had prostate cancer at 11 and is living at 47. He has also had colon polyps, unsure exact amount. He has colonoscopies every 3 years. Patient has 2 paternal aunts, no cancers. Paternal grandmother is living at 83. Grandfather had prostate cancer and died at 36, he also had Alzheimer's/other health issues that he passed from.   [name] is unaware of previous family history of genetic testing for hereditary cancer risks. Patient's maternal ancestors are of American descent, and paternal ancestors are of American descent. There is no reported Ashkenazi Jewish ancestry. There is no known consanguinity.      GENETIC TEST RESULTS: Genetic testing reported out on 12/25/2020 through the [facility] CancerNext-Expanded+RNA cancer panel found no pathogenic mutations.   The CancerNext-Expanded + RNAinsight gene panel offered by [employer] and includes sequencing and rearrangement analysis for the following 77 genes: IP, ALK, APC*, ATM*, AXIN2, BAP1, BARD1, BLM, BMPR1A, BRCA1*, BRCA2*, BRIP1*, CDC73, CDH1*,CDK4, CDKN1B, CDKN2A, CHEK2*, CTNNA1, DICER1, FANCC, FH, FLCN, GALNT12, KIF1B, LZTR1, MAX, MEN1, MET, MLH1*, MSH2*, MSH3, MSH6*, MUTYH*, NBN, NF1*, NF2, NTHL1, PALB2*, PHOX2B, PMS2*, POT1, PRKAR1A, PTCH1, PTEN*, RAD51C*, RAD51D*,RB1, RECQL, RET, SDHA, SDHAF2, SDHB, SDHC, SDHD, SMAD4, SMARCA4, SMARCB1, SMARCE1, STK11, SUFU, TMEM127, TP53*,TSC1, TSC2, VHL and XRCC2 (sequencing and deletion/duplication); EGFR, EGLN1, HOXB13, KIT, MITF, PDGFRA, POLD1 and POLE (sequencing only); EPCAM and GREM1 (deletion/duplication only).   The test report has been scanned into EPIC and is located under the Molecular Pathology section of the Results Review tab.  A portion of the result report is included below for reference.     We discussed that because current genetic testing is not perfect, it is possible there may be a gene mutation in one of these genes that current testing cannot detect, but that chance is small.  There could be another gene that has not yet been discovered, or that we have not yet tested, that is responsible for the cancer diagnoses in the family. It is also possible there is a hereditary cause for the cancer in the family that [name] did not inherit and therefore was not identified in his  testing.  Therefore, it is important to remain in touch with cancer genetics in the future so that we can continue to offer [name] the most up to date genetic testing.   ADDITIONAL GENETIC TESTING: We discussed with [name] that his genetic testing was fairly extensive.  If there are genes identified to  increase cancer risk that can be analyzed in the future, we would be happy to discuss and coordinate this testing at that time.    CANCER SCREENING RECOMMENDATIONS: [name] test result is considered negative (normal).  This means that we have not identified a hereditary cause for his  personal and family history of cancer at this time. Most cancers happen by chance and this negative test suggests that his cancer may fall into this category.    While reassuring, this does not definitively rule out a hereditary predisposition to cancer. It is still possible that there could be genetic mutations that are undetectable by current technology. There could be genetic mutations in genes that have not been tested or identified to increase cancer risk.  Therefore, it is recommended he continue to follow the cancer management and screening guidelines provided by his oncology and primary healthcare provider.   An individual's cancer risk and medical management are not determined by genetic test results alone. Overall cancer risk assessment incorporates additional factors, including personal medical history, family history, and any available genetic information that may result in a personalized plan for cancer prevention and surveillance.  RECOMMENDATIONS FOR FAMILY MEMBERS:  Relatives in this family might be at some increased risk of developing cancer, over the general population risk, simply due to the family history of cancer.  We recommended male relatives in this family have a yearly mammogram beginning at age 37, or 38 years younger than the earliest onset of cancer, an annual clinical breast exam, and perform monthly breast self-exams. Male relatives in this family should also have a gynecological exam as recommended by their primary provider.  All family members should be referred for colonoscopy starting at age 34.   FOLLOW-UP: Lastly, we discussed with [name] that cancer genetics is a rapidly  advancing field and it is possible that new genetic tests will be appropriate for him and/or his family members in the future. We encouraged him to remain in contact with cancer genetics on an annual basis so we can update his personal and family histories and let him know of advances in cancer genetics that may benefit this family.   Our contact number was provided. [name] questions were answered to his satisfaction, and he knows he is welcome to call us at anytime with additional questions or concerns.   Faith Rogue, MS, [facility] Genetic Counselor [email or web address] Phone: [phone]

## 2020-12-30 ENCOUNTER — Other Ambulatory Visit: Payer: Self-pay

## 2020-12-30 ENCOUNTER — Ambulatory Visit (INDEPENDENT_AMBULATORY_CARE_PROVIDER_SITE_OTHER): Payer: BC Managed Care – PPO | Admitting: Gastroenterology

## 2020-12-30 ENCOUNTER — Encounter: Payer: Self-pay | Admitting: Gastroenterology

## 2020-12-30 VITALS — BP 122/72 | HR 72 | Wt 174.4 lb

## 2020-12-30 DIAGNOSIS — C20 Malignant neoplasm of rectum: Secondary | ICD-10-CM | POA: Diagnosis not present

## 2020-12-30 NOTE — Patient Instructions (Signed)
If you are age 46 or older, your body mass index should be between 23-30. Your Body mass index is 25.75 kg/m. If this is out of the aforementioned range listed, please consider follow up with your Primary Care Provider.  If you are age 36 or younger, your body mass index should be between 19-25. Your Body mass index is 25.75 kg/m. If this is out of the aformentioned range listed, please consider follow up with your Primary Care Provider.   __________________________________________________________  The [facility] providers would like to encourage you to use [facility] to communicate with providers for non-urgent requests or questions.  Due to long hold times on the telephone, sending your provider a message by [facility] may be a faster and more efficient way to get a response.  Please allow 48 business hours for a response.  Please remember that this is for non-urgent requests.   Due to recent changes in healthcare laws, you may see the results of your imaging and laboratory studies on MyChart before your provider has had a chance to review them.  We understand that in some cases there may be results that are confusing or concerning to you. Not all laboratory results come back in the same time frame and the provider may be waiting for multiple results in order to interpret others.  Please give [place] 48 hours in order for your provider to thoroughly review all the results before contacting the office for clarification of your results.   Thank you for choosing me and [facility].  Vito Cirigliano, D.O.

## 2020-12-30 NOTE — Progress Notes (Signed)
   Chief Complaint:    Rectal cancer  GI History: 45 year old male with rectal adenocarcinoma diagnosed on index screening colonoscopy in 11/2020.  -12/04/2020: Colonoscopy: Rectal mass located 5 cm from the anal verge (path: Adenocarcinoma).  Otherwise normal-appearing colon.  Normal TI - CEA normal - Genetics evaluation: Normal/no gene mutation -12/16/2020: CT C/A/P: 3 x 2.9 cm mass in the low rectum.  Incidental horseshoe kidney and prostamegaly.  No evidence of metastasis. - 12/16/2020: MRI pelvis: 4 cm T3 mass in low rectum extending 4-8 cm above anal verge, semicircumferential.   HPI:     Patient is a 45 y.o. male presenting to the Gastroenterology Clinic for follow-up after recent colonoscopy in 12/04/2020 demonstrated rectal CA as outlined above.  Since then, has been seeing by Radiation Oncology and Medical Oncology at [facility] with plan to start radiation next week.  Has also been seen by Colorectal Surgery at [facility] with tentative plan for surgical resection after neoadjuvant chemoradiation.  Was evaluated in the [facility] and testing was negative for gene mutation.  Since last appointment, he has gotten married and presents with his spouse today to the office.   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     levothyroxine (SYNTHROID) 75 MCG tablet Take 1 tablet (75 mcg total) by mouth daily before breakfast. 90 tablet 3   traZODone (DESYREL) 50 MG tablet TAKE 1/2 TO 1 TABLET(25 TO 50 MG)  BY MOUTH AT BEDTIME AS NEEDED FOR SLEEP 45 tablet 2   No current facility-administered medications for this visit.    Physical Exam:     BP 122/72 (BP Location: Left Arm, Patient Position: Sitting, Cuff Size: Normal)   Pulse 72   Wt 174 lb 6 oz (79.1 kg)   BMI 25.75 kg/m   GENERAL:  Pleasant male in NAD PSYCH: : Cooperative, normal affect Musculoskeletal:  Normal muscle tone, normal strength NEURO: Alert and oriented x 3, no focal neurologic deficits   IMPRESSION and PLAN:    1) Rectal Adenocarcinoma - Plan for initiation of radiation next week followed by chemotherapy at [facility] for follow-up with Dr. Hester Mates at [facility]  -Can follow-up with me after completion of chemoradiation and surgery           Ashley ,DO, FACG 12/30/2020, 9:45 AM

## 2021-01-05 ENCOUNTER — Other Ambulatory Visit: Payer: Self-pay

## 2021-01-05 ENCOUNTER — Encounter: Payer: Self-pay | Admitting: Internal Medicine

## 2021-01-05 ENCOUNTER — Ambulatory Visit (INDEPENDENT_AMBULATORY_CARE_PROVIDER_SITE_OTHER): Payer: BC Managed Care – PPO | Admitting: Internal Medicine

## 2021-01-05 VITALS — BP 120/70 | HR 61 | Resp 18 | Ht 69.0 in | Wt 175.0 lb

## 2021-01-05 DIAGNOSIS — E039 Hypothyroidism, unspecified: Secondary | ICD-10-CM | POA: Diagnosis not present

## 2021-01-05 DIAGNOSIS — Z0000 Encounter for general adult medical examination without abnormal findings: Secondary | ICD-10-CM | POA: Diagnosis not present

## 2021-01-05 LAB — LIPID PANEL
Cholesterol: 162 mg/dL (ref 0–200)
HDL: 37.6 mg/dL — ABNORMAL LOW (ref >39.00)
LDL Cholesterol: 86 mg/dL (ref 0–99)
NonHDL: 123.96
Total CHOL/HDL Ratio: 4
Triglycerides: 190 mg/dL — ABNORMAL HIGH (ref 0.0–149.0)
VLDL: 38 mg/dL (ref 0.0–40.0)

## 2021-01-05 LAB — TSH: TSH: 1.38 u[IU]/mL (ref 0.35–5.50)

## 2021-01-05 MED ORDER — LEVOTHYROXINE SODIUM 75 MCG PO TABS: 75.0000 ug | ORAL_TABLET | Freq: Every day | ORAL | 3 refills | Status: DC

## 2021-01-05 MED ORDER — TRAZODONE HCL 50 MG PO TABS: ORAL_TABLET | ORAL | 3 refills | Status: DC

## 2021-01-05 NOTE — Assessment & Plan Note (Signed)
Checking TSH and adjust synthroid 75 mcg daily as needed. 

## 2021-01-05 NOTE — Assessment & Plan Note (Signed)
Flu shot up to date. Covid-19 booster up to date. Pneumonia counseled he qualifies. Tetanus up to date. Colonoscopy up to date and going through rectal cancer currently. Counseled about sun safety and mole surveillance. Counseled about the dangers of distracted driving. Given 10 year screening recommendations.

## 2021-01-05 NOTE — Progress Notes (Signed)
   Subjective:   Patient ID: Edward [name], male    DOB: 1975-02-16, 45 y.o.   MRN: [id number]  HPI The patient is a 45 YO man coming in for physical. Recent cancer rectal diagnosis.  PMH, [facility], social history reviewed and updated  Review of Systems  Constitutional: Negative.   HENT: Negative.    Eyes: Negative.   Respiratory:  Negative for cough, chest tightness and shortness of breath.   Cardiovascular:  Negative for chest pain, palpitations and leg swelling.  Gastrointestinal:  Negative for abdominal distention, abdominal pain, constipation, diarrhea, nausea and vomiting.  Musculoskeletal: Negative.   Skin: Negative.   Neurological: Negative.   Psychiatric/Behavioral: Negative.     Objective:  Physical Exam Constitutional:      Appearance: He is well-developed.  HENT:     Head: Normocephalic and atraumatic.  Cardiovascular:     Rate and Rhythm: Normal rate and regular rhythm.  Pulmonary:     Effort: Pulmonary effort is normal. No respiratory distress.     Breath sounds: Normal breath sounds. No wheezing or rales.  Abdominal:     General: Bowel sounds are normal. There is no distension.     Palpations: Abdomen is soft.     Tenderness: There is no abdominal tenderness. There is no rebound.  Musculoskeletal:     Cervical back: Normal range of motion.  Skin:    General: Skin is warm and dry.  Neurological:     Mental Status: He is alert and oriented to person, place, and time.     Coordination: Coordination normal.    Vitals:   01/05/21 0925  BP: 120/70  Pulse: 61  Resp: 18  SpO2: 98%  Weight: 175 lb (79.4 kg)  Height: 5\' 9"  (1.753 m)    This visit occurred during the SARS-CoV-2 public health emergency.  Safety protocols were in place, including screening questions prior to the visit, additional usage of staff PPE, and extensive cleaning of exam room while observing appropriate contact time as indicated for disinfecting solutions.   Assessment & Plan:

## 2021-01-05 NOTE — Patient Instructions (Addendum)
Ask about the pneumonia vaccine.

## 2021-01-18 ENCOUNTER — Other Ambulatory Visit: Payer: Self-pay

## 2021-01-18 ENCOUNTER — Ambulatory Visit (INDEPENDENT_AMBULATORY_CARE_PROVIDER_SITE_OTHER): Payer: BC Managed Care – PPO | Admitting: Psychologist

## 2021-01-18 DIAGNOSIS — F330 Major depressive disorder, recurrent, mild: Secondary | ICD-10-CM

## 2021-02-01 ENCOUNTER — Ambulatory Visit (INDEPENDENT_AMBULATORY_CARE_PROVIDER_SITE_OTHER): Payer: BC Managed Care – PPO | Admitting: Psychologist

## 2021-02-01 ENCOUNTER — Other Ambulatory Visit: Payer: Self-pay

## 2021-02-01 ENCOUNTER — Ambulatory Visit: Payer: BC Managed Care – PPO | Admitting: Psychologist

## 2021-02-01 DIAGNOSIS — F330 Major depressive disorder, recurrent, mild: Secondary | ICD-10-CM

## 2021-02-01 NOTE — Progress Notes (Signed)
[facility] Counselor/Therapist Progress Note  Patient ID: Edward [name], MRN: [id number],    Date: 02/01/2021  Time Spent: 8:00 am to 8:52 am; Total Time: 52 minutes   This session was held via in person. The patient consented to in-person therapy and was in the clinician's office. Limits of confidentiality were discussed with the patient.    Treatment Type: Individual Therapy  Reported Symptoms: More anxiety symptoms.   Mental Status Exam: Appearance:  Well Groomed     Behavior: Appropriate  Motor: Normal  Speech/Language:  Normal Rate  Affect: Appropriate  Mood: normal  Thought process: normal  Thought content:   WNL  Sensory/Perceptual disturbances:   WNL  Orientation: oriented to person, place, time/date, situation, and day of week  Attention: Good  Concentration: Good  Memory: WNL  Fund of knowledge:  Good  Insight:   Good  Judgment:  Fair  Impulse Control: Good   Risk Assessment: Danger to Self:  No Self-injurious Behavior: No Danger to Others: No Duty to Warn:no Physical Aggression / Violence:No  Access to Firearms a concern: No  Gang Involvement:No   Subjective: Patient described himself as experiencing more anxiety. Elaborating, he stated that he is experiencing more anxiety related to making a decision regarding different treatment options for cancer as well as difficult family dynamics leading up to family visiting for the holidays. He primarily spent time reflecting on ways to manage the anticipated family conflict. He processed thoughts and emotions. He asked for a recommendation for a palliative care physician at [facility]. He denied suicidal and homicidal ideation. He asked to follow up.    Interventions:  Worked on developing a therapeutic relationship with the patient. Used active listening and reflective statements. Provided emotional support using empathy and validation. Normalized and validated expressed thoughts and emotions. Reviewed the  treatment plan. Reviewed events since the intake. Explored family dynamics within the family of origin. Identified several different themes. Used socratic questions to assist the patient. Challenged the patient and challenged some of the thoughts expressed. Assisted in problem solving related to family dynamics. Briefly provided psychoeducation about palliative care and exploring quality of life. Processed expressed thoughts and emotions. Provided empathic statements. Assessed for suicidal and homicidal ideation.  Homework: Communicate needs to wife  Next Session: Emotional support  Diagnosis: F33.0 major depressive affective disorder, recurrent, mild  Plan:   Client Abilities:  Friendly and easy to develop rapport  Client Preferences: ACT and CBT  Client statement of Needs: Coping skills and process emotions  Treatment Level: Outpatient  Symptoms:  Feeling down, sad, rumination of negative thoughts, low self-esteem, thoughts of hopelessness, fatigue, social isolation, and avoiding pleasurable activities. He denied suicidal and homicidal ideation.   Goals:     Work through the grieving process and face reality of own death Accept emotional support from others around them Live life to the fullest, event though time may be limited Become as knowledgeable about the medical condition  Reduce fear, anxiety about the health condition  Accept the illness Accept the role of psychological and behavioral factors  Stabilize anxiety level wile increasing ability to function Learn and implement coping skills that result in a reduction of anxiety  Alleviate depressive symptoms Recognize, accept, and cope with depressive feelings Develop healthy thinking patterns Develop healthy interpersonal relationships  Objectives: Target Date for all Objectives: 01/18/2022 Identify feelings associated with the illness  Family members share with each other feelings Identify the losses or limitations that  have been experienced Verbalize acceptance of  the reality of the medical condition Commit to learning and implement a proactive approach to managing personal stresses Verbalize an understanding of the medical condition Work with therapist to develop a plan for coping with stress Learn and implement skills for managing stress Engage in social, productive activities that are possible Engage in faith based activities implement positive imagery Identify coping skills and sources of emotional support Patient's partner and family members verbalize their fears regarding severity of health condition Identify sources of emotional distress  Learning and implement calming skills to reduce overall anxiety Learn and implement problem solving strategies Identify and engage in pleasant activities Learning and implement personal and interpersonal skills to reduce anxiety and improve interpersonal relationships Learn to accept limitations in life and commit to tolerating, rather than avoiding, unpleasant emotions while accomplishing meaningful goals Identify major life conflicts from the past and present that form the basis for present anxiety Learn and implement behavioral strategies Verbalize an understanding and resolution of current interpersonal problems Learn and implement problem solving and decision making skills Learn and implement conflict resolution skills to resolve interpersonal problems Verbalize an understanding of healthy and unhealthy emotions verbalize insight into how past relationships may be influence current experiences with depression Use mindfulness and acceptance strategies and increase value based behavior  Increase hopeful statements about the future.    Interventions  Teach about stress and ways to handle stress Assist the patient in developing a coping action plan for stressors Conduct skills based training for coping strategies Train problem focused skills Sort out what  activities the individual can do Encourage patient to rely upon his/her spiritual faith Teach the patient to use guided imagery Probe and evaluate family's ability to provide emotional support Allow family to share their fears Assist the patient in identifying, sorting through, and verbalizing the various feelings generated by his/her medical condition Meet with family members  Ask patient list out limitations  Use stress inoculation training  Use Acceptance and Commitment Therapy to help client accept uncomfortable realities in order to accomplish value-consistent goals Reinforce the client's insight into the role of his/her past emotional pain and present anxiety  Discuss examples demonstrating that unrealistic worry overestimates the probability of threats and und3restmiates patient's ability  Assist the patient in analyzing his or her worries Help patient understand that avoidance is reinforcing  Behavioral activation help the client explore the relationship, nature of the dispute,  Help the client develop new interpersonal skills and relationships Conduct Problem so living therapy Teach conflict resolution skills Use a process-experiential approach Conduct TLDP Conduct ACT  The patient reviewed the treatment plan on 02/01/2021. The patient approved of the treatment plan.     Conception Chancy, PsyD

## 2021-02-17 ENCOUNTER — Ambulatory Visit (INDEPENDENT_AMBULATORY_CARE_PROVIDER_SITE_OTHER): Payer: BC Managed Care – PPO | Admitting: Psychologist

## 2021-02-17 ENCOUNTER — Other Ambulatory Visit: Payer: Self-pay

## 2021-02-17 DIAGNOSIS — F330 Major depressive disorder, recurrent, mild: Secondary | ICD-10-CM

## 2021-02-17 NOTE — Progress Notes (Signed)
[facility] Counselor/Therapist Progress Note  Patient ID: Edward [name], MRN: [id number],    Date: 02/17/2021  Time Spent: 8:05 am to 8:50 am; Total Time: 45 minutes   This session was held via in person. The patient consented to in-person therapy and was in the clinician's office. Limits of confidentiality were discussed with the patient.    Treatment Type: Individual Therapy  Reported Symptoms:  Anxiety symptoms.   Mental Status Exam: Appearance:  Well Groomed     Behavior: Appropriate  Motor: Normal  Speech/Language:  Normal Rate  Affect: Appropriate  Mood: normal  Thought process: normal  Thought content:   WNL  Sensory/Perceptual disturbances:   WNL  Orientation: oriented to person, place, time/date, situation, and day of week  Attention: Good  Concentration: Good  Memory: WNL  Fund of knowledge:  Good  Insight:   Good  Judgment:  Fair  Impulse Control: Good   Risk Assessment: Danger to Self:  No Self-injurious Behavior: No Danger to Others: No Duty to Warn:no Physical Aggression / Violence:No  Access to Firearms a concern: No  Gang Involvement:No   Subjective: Patient described himself as doing okay while reflecting on events since the last session. He acknowledged that the holidays went better than expected while reflecting on them. He also voiced that he felt heard by  his medical team at [facility] and that he starts chemotherapy January 25th. From there, he stated he wanted coping strategies. After discussing, practicing, and processing coping strategies, he stated he was better. He was agreeable to the homework. He denied suicidal and homicidal ideation. He asked to follow up.    Interventions:  Worked on developing a therapeutic relationship with the patient. Used active listening and reflective statements. Provided emotional support using empathy and validation. Normalized and validated expressed thoughts and emotions. Praised patient for doing  better. Explored thoughts and emotions related to the holiday season. Processed what assisted the patient in getting through the holidays. Reflected and processed conversations with medical team at [facility]. Briefly explored emotions related to starting chemotherapy at the end of the month. Provided psychoeducation about guided imagery, defusion, calm app, calm videos, and worry box. Practiced and processed guided imagery and defusion. Praised patient for experiencing less distress. Assisted in problem solving. Assigned homework. Provided empathic statements. Assessed for suicidal and homicidal ideation.  Homework: Implement coping strategies  Next Session: Review homework, discuss emotions and thoughts related to relationship challenges with spouse, and emotional support.   Diagnosis: F33.0 major depressive affective disorder, recurrent, mild  Plan:   Client Abilities:  Friendly and easy to develop rapport  Client Preferences: ACT and CBT  Client statement of Needs: Coping skills and process emotions  Treatment Level: Outpatient  Symptoms:  Feeling down, sad, rumination of negative thoughts, low self-esteem, thoughts of hopelessness, fatigue, social isolation, and avoiding pleasurable activities. He denied suicidal and homicidal ideation.   Goals:     Work through the grieving process and face reality of own death Accept emotional support from others around them Live life to the fullest, event though time may be limited Become as knowledgeable about the medical condition  Reduce fear, anxiety about the health condition  Accept the illness Accept the role of psychological and behavioral factors  Stabilize anxiety level wile increasing ability to function Learn and implement coping skills that result in a reduction of anxiety  Alleviate depressive symptoms Recognize, accept, and cope with depressive feelings Develop healthy thinking patterns Develop healthy interpersonal  relationships  Objectives:  Target Date for all Objectives: 01/18/2022 Identify feelings associated with the illness  Family members share with each other feelings Identify the losses or limitations that have been experienced Verbalize acceptance of the reality of the medical condition Commit to learning and implement a proactive approach to managing personal stresses Verbalize an understanding of the medical condition Work with therapist to develop a plan for coping with stress Learn and implement skills for managing stress Engage in social, productive activities that are possible Engage in faith based activities implement positive imagery Identify coping skills and sources of emotional support Patient's partner and family members verbalize their fears regarding severity of health condition Identify sources of emotional distress  Learning and implement calming skills to reduce overall anxiety Learn and implement problem solving strategies Identify and engage in pleasant activities Learning and implement personal and interpersonal skills to reduce anxiety and improve interpersonal relationships Learn to accept limitations in life and commit to tolerating, rather than avoiding, unpleasant emotions while accomplishing meaningful goals Identify major life conflicts from the past and present that form the basis for present anxiety Learn and implement behavioral strategies Verbalize an understanding and resolution of current interpersonal problems Learn and implement problem solving and decision making skills Learn and implement conflict resolution skills to resolve interpersonal problems Verbalize an understanding of healthy and unhealthy emotions verbalize insight into how past relationships may be influence current experiences with depression Use mindfulness and acceptance strategies and increase value based behavior  Increase hopeful statements about the future.    Interventions  Teach  about stress and ways to handle stress Assist the patient in developing a coping action plan for stressors Conduct skills based training for coping strategies Train problem focused skills Sort out what activities the individual can do Encourage patient to rely upon his/her spiritual faith Teach the patient to use guided imagery Probe and evaluate family's ability to provide emotional support Allow family to share their fears Assist the patient in identifying, sorting through, and verbalizing the various feelings generated by his/her medical condition Meet with family members  Ask patient list out limitations  Use stress inoculation training  Use Acceptance and Commitment Therapy to help client accept uncomfortable realities in order to accomplish value-consistent goals Reinforce the client's insight into the role of his/her past emotional pain and present anxiety  Discuss examples demonstrating that unrealistic worry overestimates the probability of threats and und3restmiates patient's ability  Assist the patient in analyzing his or her worries Help patient understand that avoidance is reinforcing  Behavioral activation help the client explore the relationship, nature of the dispute,  Help the client develop new interpersonal skills and relationships Conduct Problem so living therapy Teach conflict resolution skills Use a process-experiential approach Conduct TLDP Conduct ACT  The patient reviewed the treatment plan on 02/01/2021. The patient approved of the treatment plan.     Conception Chancy, PsyD

## 2021-02-24 ENCOUNTER — Ambulatory Visit (INDEPENDENT_AMBULATORY_CARE_PROVIDER_SITE_OTHER): Payer: BC Managed Care – PPO | Admitting: Psychologist

## 2021-02-24 ENCOUNTER — Other Ambulatory Visit: Payer: Self-pay

## 2021-02-24 DIAGNOSIS — F330 Major depressive disorder, recurrent, mild: Secondary | ICD-10-CM | POA: Diagnosis not present

## 2021-02-24 NOTE — Progress Notes (Signed)
[facility] Counselor/Therapist Progress Note  Patient ID: Edward [name], MRN: [id number],    Date: 02/24/2021  Time Spent: 9:03 am to 9:50 am; Total Time: 47 minutes   This session was held via in person. The patient consented to in-person therapy and was in the clinician's office. Limits of confidentiality were discussed with the patient.    Treatment Type: Individual Therapy  Reported Symptoms:  Anxiety symptoms. Worries related to chemotherapy and Edward [name]   Mental Status Exam: Appearance:  Well Groomed     Behavior: Appropriate  Motor: Normal  Speech/Language:  Normal Rate  Affect: Appropriate  Mood: normal  Thought process: normal  Thought content:   WNL  Sensory/Perceptual disturbances:   WNL  Orientation: oriented to person, place, time/date, situation, and day of week  Attention: Good  Concentration: Good  Memory: WNL  Fund of knowledge:  Good  Insight:   Good  Judgment:  Fair  Impulse Control: Good   Risk Assessment: Danger to Self:  No Self-injurious Behavior: No Danger to Others: No Duty to Warn:no Physical Aggression / Violence:No  Access to Firearms a concern: No  Gang Involvement:No   Subjective: Patient described himself as fair and talked about how he was implementing coping strategies. From there, he stated that he wanted to discuss fears related to chemotherapy, colonoscopy, and Edward [name]. Patient primarily spent the session reflecting on fears and frustrations with his spouse. He processed thoughts and emotions related to this. From there he then briefly reflected on fears related to chemotherapy. He processed thoughts and emotions he was experiencing. He was agreeable to the homework. He denied suicidal and homicidal ideation. He asked to follow up.    Interventions:  Worked on developing a therapeutic relationship with the patient. Used active listening and reflective statements. Provided emotional support using empathy and validation.  Normalized and validated expressed thoughts and emotions. Used summary statements. Reflected on how patient was implementing coping strategies. Validated patient's experience. Identified goals for the session. Explored some of the challenges patient experiences with spouse, Edward [name]. Used socratic questions to assist the patient gain insight. Challenged some of the thoughts expressed. Assisted the patient gain insight. Provided psychoeducation about validation. Identified theme of parentifying. Provided psychoeducation about fears. Processed roles patient was expressing. Briefly explored values. Assisted in problem solving. Processed how patient can implement values.  Assigned homework. Provided empathic statements. Assessed for suicidal and homicidal ideation.  Homework: Complete handouts on values  Next Session: Review homework, and emotional support  Diagnosis: F33.0 major depressive affective disorder, recurrent, mild  Plan:   Client Abilities:  Friendly and easy to develop rapport  Client Preferences: ACT and CBT  Client statement of Needs: Coping skills and process emotions  Treatment Level: Outpatient  Symptoms:  Feeling down, sad, rumination of negative thoughts, low self-esteem, thoughts of hopelessness, fatigue, social isolation, and avoiding pleasurable activities. He denied suicidal and homicidal ideation.   Goals:     Work through the grieving process and face reality of own death Accept emotional support from others around them Live life to the fullest, event though time may be limited Become as knowledgeable about the medical condition  Reduce fear, anxiety about the health condition  Accept the illness Accept the role of psychological and behavioral factors  Stabilize anxiety level wile increasing ability to function Learn and implement coping skills that result in a reduction of anxiety  Alleviate depressive symptoms Recognize, accept, and cope with depressive  feelings Develop healthy thinking patterns Develop healthy interpersonal relationships  Objectives: Target Date for all Objectives: 01/18/2022 Identify feelings associated with the illness  Family members share with each other feelings Identify the losses or limitations that have been experienced Verbalize acceptance of the reality of the medical condition Commit to learning and implement a proactive approach to managing personal stresses Verbalize an understanding of the medical condition Work with therapist to develop a plan for coping with stress Learn and implement skills for managing stress Engage in social, productive activities that are possible Engage in faith based activities implement positive imagery Identify coping skills and sources of emotional support Patient's partner and family members verbalize their fears regarding severity of health condition Identify sources of emotional distress  Learning and implement calming skills to reduce overall anxiety Learn and implement problem solving strategies Identify and engage in pleasant activities Learning and implement personal and interpersonal skills to reduce anxiety and improve interpersonal relationships Learn to accept limitations in life and commit to tolerating, rather than avoiding, unpleasant emotions while accomplishing meaningful goals Identify major life conflicts from the past and present that form the basis for present anxiety Learn and implement behavioral strategies Verbalize an understanding and resolution of current interpersonal problems Learn and implement problem solving and decision making skills Learn and implement conflict resolution skills to resolve interpersonal problems Verbalize an understanding of healthy and unhealthy emotions verbalize insight into how past relationships may be influence current experiences with depression Use mindfulness and acceptance strategies and increase value based behavior   Increase hopeful statements about the future.    Interventions  Teach about stress and ways to handle stress Assist the patient in developing a coping action plan for stressors Conduct skills based training for coping strategies Train problem focused skills Sort out what activities the individual can do Encourage patient to rely upon his/her spiritual faith Teach the patient to use guided imagery Probe and evaluate family's ability to provide emotional support Allow family to share their fears Assist the patient in identifying, sorting through, and verbalizing the various feelings generated by his/her medical condition Meet with family members  Ask patient list out limitations  Use stress inoculation training  Use Acceptance and Commitment Therapy to help client accept uncomfortable realities in order to accomplish value-consistent goals Reinforce the client's insight into the role of his/her past emotional pain and present anxiety  Discuss examples demonstrating that unrealistic worry overestimates the probability of threats and und3restmiates patient's ability  Assist the patient in analyzing his or her worries Help patient understand that avoidance is reinforcing  Behavioral activation help the client explore the relationship, nature of the dispute,  Help the client develop new interpersonal skills and relationships Conduct Problem so living therapy Teach conflict resolution skills Use a process-experiential approach Conduct TLDP Conduct ACT  The patient reviewed the treatment plan on 02/01/2021. The patient approved of the treatment plan.     Conception Chancy, PsyD

## 2021-03-09 ENCOUNTER — Ambulatory Visit (INDEPENDENT_AMBULATORY_CARE_PROVIDER_SITE_OTHER): Payer: BC Managed Care – PPO | Admitting: Psychologist

## 2021-03-09 ENCOUNTER — Other Ambulatory Visit: Payer: Self-pay

## 2021-03-09 DIAGNOSIS — F330 Major depressive disorder, recurrent, mild: Secondary | ICD-10-CM | POA: Diagnosis not present

## 2021-03-09 NOTE — Progress Notes (Signed)
[place] Counselor/Therapist Progress Note  Patient ID: Edward [name], MRN: [id number],    Date: 03/09/2021  Time Spent: 9:05 am to 9:53 am; Total Time: 48 minutes   This session was held via in person. The patient consented to in-person therapy and was in the clinician's office. Limits of confidentiality were discussed with the patient.    Treatment Type: Individual Therapy  Reported Symptoms:  Anxiety symptoms.   Mental Status Exam: Appearance:  Well Groomed     Behavior: Appropriate  Motor: Normal  Speech/Language:  Normal Rate  Affect: Appropriate  Mood: normal  Thought process: normal  Thought content:   WNL  Sensory/Perceptual disturbances:   WNL  Orientation: oriented to person, place, time/date, situation, and day of week  Attention: Good  Concentration: Good  Memory: WNL  Fund of knowledge:  Good  Insight:   Good  Judgment:  Fair  Impulse Control: Good   Risk Assessment: Danger to Self:  No Self-injurious Behavior: No Danger to Others: No Duty to Warn:no Physical Aggression / Violence:No  Access to Firearms a concern: No  Gang Involvement:No   Subjective: Patient described himself as doing okay while reflecting on events since the last session. He processed how he was feeling about starting chemotherapy tomorrow and what steps he is planning to take to help facilitate less distress from side-effects. From there, he briefly talked about his family. Continuing to talk, he acknowledged completing his homework and spent the majority of the session processing his values. Specifically, patient talked about how he can use values to direct goal related behavior. Through conversing the value of socialization and connection were identified. Patient processed how to use SMART goals to accomplish that value while undergoing cancer treatment. He reflected on how the homework assignment facilitated him being very reflective. He was agreeable to the homework. He  denied suicidal and homicidal ideation. He asked to follow up.    Interventions:  Worked on developing a therapeutic relationship with the patient. Used active listening and reflective statements. Provided emotional support using empathy and validation. Normalized and validated expressed thoughts and emotions. Praised patient for doing okay. Validated and normalized emotions related to beginning chemotherapy treatment tomorrow. Briefly processed thoughts related to upcoming family interactions. Reviewed homework assignment and spent time processing values. Used socratic questions to assist the patient gain insight into self. Provided psychoeducation about SMART goals and processed how patient could implement SMART goals. Assisted in problem solving. Identified the values of socialization and connections. Processed what steps patient can take to implement those values. Provided psychoeducation about podcast of [employer]. Processed expressed thoughts and emotions.  Assigned homework. Provided empathic statements. Assessed for suicidal and homicidal ideation.  Homework: Listen to podcast, discuss and implement SMART goals to meet values of socialization and connection.   Next Session: Review homework, and emotional support  Diagnosis: F33.0 major depressive affective disorder, recurrent, mild  Plan:   Client Abilities:  Friendly and easy to develop rapport  Client Preferences: ACT and CBT  Client statement of Needs: Coping skills and process emotions  Treatment Level: Outpatient  Symptoms:  Feeling down, sad, rumination of negative thoughts, low self-esteem, thoughts of hopelessness, fatigue, social isolation, and avoiding pleasurable activities. He denied suicidal and homicidal ideation.   Goals:     Work through the grieving process and face reality of own death Accept emotional support from others around them Live life to the fullest, event though time may be  limited Become as knowledgeable about  the medical condition  Reduce fear, anxiety about the health condition  Accept the illness Accept the role of psychological and behavioral factors  Stabilize anxiety level wile increasing ability to function Learn and implement coping skills that result in a reduction of anxiety  Alleviate depressive symptoms Recognize, accept, and cope with depressive feelings Develop healthy thinking patterns Develop healthy interpersonal relationships  Objectives: Target Date for all Objectives: 01/18/2022 Identify feelings associated with the illness  Family members share with each other feelings Identify the losses or limitations that have been experienced Verbalize acceptance of the reality of the medical condition Commit to learning and implement a proactive approach to managing personal stresses Verbalize an understanding of the medical condition Work with therapist to develop a plan for coping with stress Learn and implement skills for managing stress Engage in social, productive activities that are possible Engage in faith based activities implement positive imagery Identify coping skills and sources of emotional support Patient's partner and family members verbalize their fears regarding severity of health condition Identify sources of emotional distress  Learning and implement calming skills to reduce overall anxiety Learn and implement problem solving strategies Identify and engage in pleasant activities Learning and implement personal and interpersonal skills to reduce anxiety and improve interpersonal relationships Learn to accept limitations in life and commit to tolerating, rather than avoiding, unpleasant emotions while accomplishing meaningful goals Identify major life conflicts from the past and present that form the basis for present anxiety Learn and implement behavioral strategies Verbalize an understanding and resolution of current  interpersonal problems Learn and implement problem solving and decision making skills Learn and implement conflict resolution skills to resolve interpersonal problems Verbalize an understanding of healthy and unhealthy emotions verbalize insight into how past relationships may be influence current experiences with depression Use mindfulness and acceptance strategies and increase value based behavior  Increase hopeful statements about the future.    Interventions  Teach about stress and ways to handle stress Assist the patient in developing a coping action plan for stressors Conduct skills based training for coping strategies Train problem focused skills Sort out what activities the individual can do Encourage patient to rely upon his/her spiritual faith Teach the patient to use guided imagery Probe and evaluate family's ability to provide emotional support Allow family to share their fears Assist the patient in identifying, sorting through, and verbalizing the various feelings generated by his/her medical condition Meet with family members  Ask patient list out limitations  Use stress inoculation training  Use Acceptance and Commitment Therapy to help client accept uncomfortable realities in order to accomplish value-consistent goals Reinforce the client's insight into the role of his/her past emotional pain and present anxiety  Discuss examples demonstrating that unrealistic worry overestimates the probability of threats and und3restmiates patient's ability  Assist the patient in analyzing his or her worries Help patient understand that avoidance is reinforcing  Behavioral activation help the client explore the relationship, nature of the dispute,  Help the client develop new interpersonal skills and relationships Conduct Problem so living therapy Teach conflict resolution skills Use a process-experiential approach Conduct TLDP Conduct ACT  The patient reviewed the treatment plan  on 02/01/2021. The patient approved of the treatment plan.     Conception Chancy, PsyD                  Conception Chancy, PsyD

## 2021-03-23 ENCOUNTER — Ambulatory Visit (INDEPENDENT_AMBULATORY_CARE_PROVIDER_SITE_OTHER): Payer: BC Managed Care – PPO | Admitting: Psychologist

## 2021-03-23 ENCOUNTER — Other Ambulatory Visit: Payer: Self-pay

## 2021-03-23 DIAGNOSIS — F330 Major depressive disorder, recurrent, mild: Secondary | ICD-10-CM

## 2021-03-23 NOTE — Progress Notes (Signed)
[facility] Counselor/Therapist Progress Note  Patient ID: Edward [name], MRN: [id number],    Date: 03/23/2021  Time Spent: 9:07 am to 9:52 am; total time: 45 minutes  This session was held via in person. The patient consented to in-person therapy and was in the clinician's office. Limits of confidentiality were discussed with the patient.    Treatment Type: Individual Therapy  Reported Symptoms:  Anxiety symptoms.   Mental Status Exam: Appearance:  Well Groomed     Behavior: Appropriate  Motor: Normal  Speech/Language:  Normal Rate  Affect: Appropriate  Mood: normal  Thought process: normal  Thought content:   WNL  Sensory/Perceptual disturbances:   WNL  Orientation: oriented to person, place, time/date, situation, and day of week  Attention: Good  Concentration: Good  Memory: WNL  Fund of knowledge:  Good  Insight:   Good  Judgment:  Fair  Impulse Control: Good   Risk Assessment: Danger to Self:  No Self-injurious Behavior: No Danger to Others: No Duty to Warn:no Physical Aggression / Violence:No  Access to Firearms a concern: No  Gang Involvement:No   Subjective: Patient described himself as fair while reflecting on his chemotherapy experience describing it as less than pleasant. Elaborating, he voiced experiencing side effects, that were difficult to manage for three days. From there, he spent time talking about  an upcoming podcast about diversity and feeling nervous about that podcast. Patient processed different thoughts and emotions about that podcast. Continuing to talk, he changed topics to process the idea of his parents visiting. Patient spent time identifying different options and explored the pros/cons of the different options discussed. He denied suicidal and homicidal ideation. He asked to follow up.    Interventions:  Worked on developing a therapeutic relationship with the patient. Used active listening and reflective statements. Provided  emotional support using empathy and validation. Normalized and validated expressed thoughts and emotions. Used summary statements. Validated patient's experience with the chemotherapy treatment and the side effects experienced. Processed thoughts and emotions. Used socratic questions to assist the patient gain insight into self. Challenged patient's perception of diversity and what is involved with diversity. Briefly provided psychoeducation about diversity. Explored different options for patient's parents to visit. Explored the patient's values and how they align with parents being present. Validated patient's emotions related to the parental situation. Completed decisional analysis with the patient related to parents visiting. Provided empathic statements. Assessed for suicidal and homicidal ideation.  Homework: Talk with [name] about parents visiting  Next Session: Emotional support  Diagnosis: F33.0 major depressive affective disorder, recurrent, mild  Plan:   Client Abilities:  Friendly and easy to develop rapport  Client Preferences: ACT and CBT  Client statement of Needs: Coping skills and process emotions  Treatment Level: Outpatient  Symptoms:  Feeling down, sad, rumination of negative thoughts, low self-esteem, thoughts of hopelessness, fatigue, social isolation, and avoiding pleasurable activities. He denied suicidal and homicidal ideation.   Goals:     Work through the grieving process and face reality of own death Accept emotional support from others around them Live life to the fullest, event though time may be limited Become as knowledgeable about the medical condition  Reduce fear, anxiety about the health condition  Accept the illness Accept the role of psychological and behavioral factors  Stabilize anxiety level wile increasing ability to function Learn and implement coping skills that result in a reduction of anxiety  Alleviate depressive symptoms Recognize, accept,  and cope with depressive feelings Develop healthy  thinking patterns Develop healthy interpersonal relationships  Objectives: Target Date for all Objectives: 01/18/2022 Identify feelings associated with the illness  Family members share with each other feelings Identify the losses or limitations that have been experienced Verbalize acceptance of the reality of the medical condition Commit to learning and implement a proactive approach to managing personal stresses Verbalize an understanding of the medical condition Work with therapist to develop a plan for coping with stress Learn and implement skills for managing stress Engage in social, productive activities that are possible Engage in faith based activities implement positive imagery Identify coping skills and sources of emotional support Patient's partner and family members verbalize their fears regarding severity of health condition Identify sources of emotional distress  Learning and implement calming skills to reduce overall anxiety Learn and implement problem solving strategies Identify and engage in pleasant activities Learning and implement personal and interpersonal skills to reduce anxiety and improve interpersonal relationships Learn to accept limitations in life and commit to tolerating, rather than avoiding, unpleasant emotions while accomplishing meaningful goals Identify major life conflicts from the past and present that form the basis for present anxiety Learn and implement behavioral strategies Verbalize an understanding and resolution of current interpersonal problems Learn and implement problem solving and decision making skills Learn and implement conflict resolution skills to resolve interpersonal problems Verbalize an understanding of healthy and unhealthy emotions verbalize insight into how past relationships may be influence current experiences with depression Use mindfulness and acceptance strategies and  increase value based behavior  Increase hopeful statements about the future.    Interventions  Teach about stress and ways to handle stress Assist the patient in developing a coping action plan for stressors Conduct skills based training for coping strategies Train problem focused skills Sort out what activities the individual can do Encourage patient to rely upon his/her spiritual faith Teach the patient to use guided imagery Probe and evaluate family's ability to provide emotional support Allow family to share their fears Assist the patient in identifying, sorting through, and verbalizing the various feelings generated by his/her medical condition Meet with family members  Ask patient list out limitations  Use stress inoculation training  Use Acceptance and Commitment Therapy to help client accept uncomfortable realities in order to accomplish value-consistent goals Reinforce the client's insight into the role of his/her past emotional pain and present anxiety  Discuss examples demonstrating that unrealistic worry overestimates the probability of threats and und3restmiates patient's ability  Assist the patient in analyzing his or her worries Help patient understand that avoidance is reinforcing  Behavioral activation help the client explore the relationship, nature of the dispute,  Help the client develop new interpersonal skills and relationships Conduct Problem so living therapy Teach conflict resolution skills Use a process-experiential approach Conduct TLDP Conduct ACT  The patient reviewed the treatment plan on 02/01/2021. The patient approved of the treatment plan.     Conception Chancy, PsyD                  Conception Chancy, PsyD               Conception Chancy, PsyD

## 2021-04-06 ENCOUNTER — Other Ambulatory Visit: Payer: Self-pay

## 2021-04-06 ENCOUNTER — Ambulatory Visit (INDEPENDENT_AMBULATORY_CARE_PROVIDER_SITE_OTHER): Payer: BC Managed Care – PPO | Admitting: Psychologist

## 2021-04-06 DIAGNOSIS — F330 Major depressive disorder, recurrent, mild: Secondary | ICD-10-CM

## 2021-04-06 NOTE — Progress Notes (Signed)
[facility] Counselor/Therapist Progress Note  Patient ID: Edward [name], MRN: [id number],    Date: 04/06/2021  Time Spent: 9:02 am to 9:50 am; total time: 48 minutes  This session was held via in person. The patient consented to in-person therapy and was in the clinician's office. Limits of confidentiality were discussed with the patient.    Treatment Type: Individual Therapy  Reported Symptoms:  Depressive symptoms   Mental Status Exam: Appearance:  Well Groomed     Behavior: Appropriate  Motor: Normal  Speech/Language:  Normal Rate  Affect: Appropriate  Mood: normal  Thought process: normal  Thought content:   WNL  Sensory/Perceptual disturbances:   WNL  Orientation: oriented to person, place, time/date, situation, and day of week  Attention: Good  Concentration: Good  Memory: WNL  Fund of knowledge:  Good  Insight:   Good  Judgment:  Fair  Impulse Control: Good   Risk Assessment: Danger to Self:  No Self-injurious Behavior: No Danger to Others: No Duty to Warn:no Physical Aggression / Violence:No  Access to Firearms a concern: No  Gang Involvement:No   Subjective: Patient described himself as struggling. Elaborating, he spent the session reflecting on thoughts and emotions related to going through chemotherapy treatment. Specifically, he talked about how treatment has proven challenging at times. He also talked about the annoyance of some people asking how treatment is going. He spent the session processing thoughts and emotions related to chemotherapy and experiencing cancer. He denied suicidal and homicidal ideation. He asked to follow up.    Interventions:  Worked on developing a therapeutic relationship with the patient. Used active listening and reflective statements. Provided emotional support using empathy and validation. Reviewed events since the last session. Praised patient for having an oncologist who is listening to his concerns. Validated  expressed thoughts and emotions that patient was expressing. Processed thoughts and emotions. Challenged some of the thoughts expressed. Used metaphors to assist the patient gain insight into self. Provided psychoeducation about The Growth Equation and the Practice of Groundedness. Provided empathic statements. Assessed for suicidal and homicidal ideation.  Homework: Look into resources provided by the clinician.   Next Session: Emotional support  Diagnosis: F33.0 major depressive affective disorder, recurrent, mild  Plan:   Client Abilities:  Friendly and easy to develop rapport  Client Preferences: ACT and CBT  Client statement of Needs: Coping skills and process emotions  Treatment Level: Outpatient  Goals:     Work through the grieving process and face reality of own death Accept emotional support from others around them Live life to the fullest, event though time may be limited Become as knowledgeable about the medical condition  Reduce fear, anxiety about the health condition  Accept the illness Accept the role of psychological and behavioral factors  Stabilize anxiety level wile increasing ability to function Learn and implement coping skills that result in a reduction of anxiety  Alleviate depressive symptoms Recognize, accept, and cope with depressive feelings Develop healthy thinking patterns Develop healthy interpersonal relationships  Objectives: Target Date for all Objectives: 01/18/2022 Identify feelings associated with the illness  Family members share with each other feelings Identify the losses or limitations that have been experienced Verbalize acceptance of the reality of the medical condition Commit to learning and implement a proactive approach to managing personal stresses Verbalize an understanding of the medical condition Work with therapist to develop a plan for coping with stress Learn and implement skills for managing stress Engage in social,  productive activities  that are possible Engage in faith based activities implement positive imagery Identify coping skills and sources of emotional support Patient's partner and family members verbalize their fears regarding severity of health condition Identify sources of emotional distress  Learning and implement calming skills to reduce overall anxiety Learn and implement problem solving strategies Identify and engage in pleasant activities Learning and implement personal and interpersonal skills to reduce anxiety and improve interpersonal relationships Learn to accept limitations in life and commit to tolerating, rather than avoiding, unpleasant emotions while accomplishing meaningful goals Identify major life conflicts from the past and present that form the basis for present anxiety Learn and implement behavioral strategies Verbalize an understanding and resolution of current interpersonal problems Learn and implement problem solving and decision making skills Learn and implement conflict resolution skills to resolve interpersonal problems Verbalize an understanding of healthy and unhealthy emotions verbalize insight into how past relationships may be influence current experiences with depression Use mindfulness and acceptance strategies and increase value based behavior  Increase hopeful statements about the future.    Interventions  Teach about stress and ways to handle stress Assist the patient in developing a coping action plan for stressors Conduct skills based training for coping strategies Train problem focused skills Sort out what activities the individual can do Encourage patient to rely upon his/her spiritual faith Teach the patient to use guided imagery Probe and evaluate family's ability to provide emotional support Allow family to share their fears Assist the patient in identifying, sorting through, and verbalizing the various feelings generated by his/her medical  condition Meet with family members  Ask patient list out limitations  Use stress inoculation training  Use Acceptance and Commitment Therapy to help client accept uncomfortable realities in order to accomplish value-consistent goals Reinforce the client's insight into the role of his/her past emotional pain and present anxiety  Discuss examples demonstrating that unrealistic worry overestimates the probability of threats and und3restmiates patient's ability  Assist the patient in analyzing his or her worries Help patient understand that avoidance is reinforcing  Behavioral activation help the client explore the relationship, nature of the dispute,  Help the client develop new interpersonal skills and relationships Conduct Problem so living therapy Teach conflict resolution skills Use a process-experiential approach Conduct TLDP Conduct ACT  The patient reviewed the treatment plan on 02/01/2021. The patient approved of the treatment plan.     Conception Chancy, PsyD

## 2021-04-26 ENCOUNTER — Ambulatory Visit: Payer: BC Managed Care – PPO | Admitting: Psychologist

## 2021-05-10 ENCOUNTER — Ambulatory Visit (INDEPENDENT_AMBULATORY_CARE_PROVIDER_SITE_OTHER): Payer: BC Managed Care – PPO | Admitting: Psychologist

## 2021-05-10 ENCOUNTER — Other Ambulatory Visit: Payer: Self-pay

## 2021-05-10 DIAGNOSIS — F330 Major depressive disorder, recurrent, mild: Secondary | ICD-10-CM | POA: Diagnosis not present

## 2021-05-10 NOTE — Progress Notes (Signed)
[facility] Counselor/Therapist Progress Note ? ?Patient ID: Edward [name], MRN: [id number],   ? ?Date: 05/10/2021 ? ?Time Spent: 9:03 am to 9:54 am; total time: 51 minutes ? ?This session was held via in person. The patient consented to in-person therapy and was in the clinician's office. Limits of confidentiality were discussed with the patient.  ? ? ?Treatment Type: Individual Therapy ? ?Reported Symptoms:  Depressive symptoms  ? ?Mental Status Exam: ?Appearance:  Well Groomed     ?Behavior: Appropriate  ?Motor: Normal  ?Speech/Language:  Normal Rate  ?Affect: Appropriate  ?Mood: normal  ?Thought process: normal  ?Thought content:   WNL  ?Sensory/Perceptual disturbances:   WNL  ?Orientation: oriented to person, place, time/date, situation, and day of week  ?Attention: Good  ?Concentration: Good  ?Memory: WNL  ?Fund of knowledge:  Good  ?Insight:   Good  ?Judgment:  Fair  ?Impulse Control: Good  ? ?Risk Assessment: ?Danger to Self:  No ?Self-injurious Behavior: No ?Danger to Others: No ?Duty to Warn:no ?Physical Aggression / Violence:No  ?Access to Firearms a concern: No  ?Gang Involvement:No  ? ?Subjective: Patient described himself as  doing better compared to less session. Elaborating, he reflected on events and acknowledged the last round of chemotherapy treatment was very difficult for him. From there, he processed some challenges in his life. He talked about finding a balance of supporting his wife as she works through some challenges she is experiencing related to his treatment and other factors of life. He processed different thoughts and emotions he was experiencing. He talked about the upcoming surgery. He denied suicidal and homicidal ideation. He asked to follow up.   ? ?Interventions:  Worked on developing a therapeutic relationship with the patient. Used active listening and reflective statements. Provided emotional support using empathy and validation. Used summary statements. Praised  patient for doing a little better. Normalized and validated expressed thoughts and emotions. Used socratic questions to assist the patient gain insight into self. Identified the concept of control. Identified a cultural norm in cancer treatment of trying to control other areas of life when one area is out of control. Explored whether there are support groups for caregivers of cancer patients. Assisted in problem solving. Briefly began to explore the upcoming surgery and potential questions. Validated concerns expressed by the patient. Normalized patient's experience going through cancer treatment. Provided empathic statements. Assessed for suicidal and homicidal ideation. ? ?Homework: NA ? ?Next Session: Emotional support. Discuss upcoming surgery ? ?Diagnosis: F33.0 major depressive affective disorder, recurrent, mild ? ?Plan:  ? ?Client Abilities:  Friendly and easy to develop rapport ? ?Client Preferences: ACT and CBT ? ?Client statement of Needs: Coping skills and process emotions ? ?Treatment Level: Outpatient ? ?Goals:    ? ?Work through the grieving process and face reality of own death ?Accept emotional support from others around them ?Live life to the fullest, event though time may be limited ?Become as knowledgeable about the medical condition  ?Reduce fear, anxiety about the health condition  ?Accept the illness ?Accept the role of psychological and behavioral factors  ?Stabilize anxiety level wile increasing ability to function ?Learn and implement coping skills that result in a reduction of anxiety  ?Alleviate depressive symptoms ?Recognize, accept, and cope with depressive feelings ?Develop healthy thinking patterns ?Develop healthy interpersonal relationships ? ?Objectives: Target Date for all Objectives: 01/18/2022 ?Identify feelings associated with the illness  ?Family members share with each other feelings ?Identify the losses or limitations that have been experienced ?  Verbalize acceptance of the  reality of the medical condition ?Commit to learning and implement a proactive approach to managing personal stresses ?Verbalize an understanding of the medical condition ?Work with therapist to develop a plan for coping with stress ?Learn and implement skills for managing stress ?Engage in social, productive activities that are possible ?Engage in faith based activities ?implement positive imagery ?Identify coping skills and sources of emotional support ?Patient's partner and family members verbalize their fears regarding severity of health condition ?Identify sources of emotional distress  ?Learning and implement calming skills to reduce overall anxiety ?Learn and implement problem solving strategies ?Identify and engage in pleasant activities ?Learning and implement personal and interpersonal skills to reduce anxiety and improve interpersonal relationships ?Learn to accept limitations in life and commit to tolerating, rather than avoiding, unpleasant emotions while accomplishing meaningful goals ?Identify major life conflicts from the past and present that form the basis for present anxiety ?Learn and implement behavioral strategies ?Verbalize an understanding and resolution of current interpersonal problems ?Learn and implement problem solving and decision making skills ?Learn and implement conflict resolution skills to resolve interpersonal problems ?Verbalize an understanding of healthy and unhealthy emotions verbalize insight into how past relationships may be influence current experiences with depression ?Use mindfulness and acceptance strategies and increase value based behavior  ?Increase hopeful statements about the future.  ? ? ?Interventions ? ?Teach about stress and ways to handle stress ?Assist the patient in developing a coping action plan for stressors ?Conduct skills based training for coping strategies ?Train problem focused skills ?Sort out what activities the individual can do ?Encourage patient  to rely upon his/her spiritual faith ?Teach the patient to use guided imagery ?Probe and evaluate family's ability to provide emotional support ?Allow family to share their fears ?Assist the patient in identifying, sorting through, and verbalizing the various feelings generated by his/her medical condition ?Meet with family members  ?Ask patient list out limitations  ?Use stress inoculation training  ?Use Acceptance and Commitment Therapy to help client accept uncomfortable realities in order to accomplish value-consistent goals ?Reinforce the client's insight into the role of his/her past emotional pain and present anxiety  ?Discuss examples demonstrating that unrealistic worry overestimates the probability of threats and und3restmiates patient's ability  ?Assist the patient in analyzing his or her worries ?Help patient understand that avoidance is reinforcing  ?Behavioral activation help the client explore the relationship, nature of the dispute,  ?Help the client develop new interpersonal skills and relationships ?Conduct Problem so living therapy ?Teach conflict resolution skills ?Use a process-experiential approach ?Conduct TLDP ?Conduct ACT ? ?The patient reviewed the treatment plan on 02/01/2021. The patient approved of the treatment plan.  ? ? ? ?Conception Chancy, PsyD ? ? ?

## 2021-05-25 ENCOUNTER — Ambulatory Visit (INDEPENDENT_AMBULATORY_CARE_PROVIDER_SITE_OTHER): Payer: BC Managed Care – PPO | Admitting: Psychologist

## 2021-05-25 DIAGNOSIS — F330 Major depressive disorder, recurrent, mild: Secondary | ICD-10-CM

## 2021-05-25 NOTE — Progress Notes (Signed)
[facility] Counselor/Therapist Progress Note ? ?Patient ID: Edward [name], MRN: [id number],   ? ?Date: 05/25/2021 ? ?Time Spent: 9:10 am to 9:55 am; total time: 45 minutes ? ?This session was held via in person. The patient consented to in-person therapy and was in the clinician's office. Limits of confidentiality were discussed with the patient.  ? ? ?Treatment Type: Individual Therapy ? ?Reported Symptoms:  Depressive symptoms  ? ?Mental Status Exam: ?Appearance:  Well Groomed     ?Behavior: Appropriate  ?Motor: Normal  ?Speech/Language:  Normal Rate  ?Affect: Appropriate  ?Mood: normal  ?Thought process: normal  ?Thought content:   WNL  ?Sensory/Perceptual disturbances:   WNL  ?Orientation: oriented to person, place, time/date, situation, and day of week  ?Attention: Good  ?Concentration: Good  ?Memory: WNL  ?Fund of knowledge:  Good  ?Insight:   Good  ?Judgment:  Fair  ?Impulse Control: Good  ? ?Risk Assessment: ?Danger to Self:  No ?Self-injurious Behavior: No ?Danger to Others: No ?Duty to Warn:no ?Physical Aggression / Violence:No  ?Access to Firearms a concern: No  ?Gang Involvement:No  ? ?Subjective: Patient described himself as  doing well while reflecting on events since the last session. Patient began the session by talking about the upcoming surgery. He processed thoughts and emotions he was experiencing. From there, he talked about some annoyances within his family system and different interpersonal relationships within the family system. He briefly indicated that his wife is experiencing some health concerns. He voiced that he and his wife have not talked about the upcoming surgery. He briefly talked about some annoyances with the medical team and processed some of those thoughts. He denied suicidal and homicidal ideation. He asked to follow up.   ? ?Interventions:  Worked on developing a therapeutic relationship with the patient. Used active listening and reflective statements.  Provided emotional support using empathy and validation. Reflected on events since the last session. Praised patient for doing well. Explored what has assisted the patient. Used socratic questions to assist the patient gain insight into self. Challenged some of the thoughts expressed. Processed thoughts and emotions related to the upcoming surgery. Used self-disclosure to assist the patient gain insight. Processed the idea of including the palliative care team to have someone look at the whole picture. Provided feedback related to value statements and how that can assist patient with making behavioral changes. Discussed next steps for counseling. Validated expressed thoughts. Provided empathic statements. Assessed for suicidal and homicidal ideation. ? ?Homework: NA ? ?Next Session: Emotional support.  ? ?Diagnosis: F33.0 major depressive affective disorder, recurrent, mild ? ?Plan:  ? ?Client Abilities:  Friendly and easy to develop rapport ? ?Client Preferences: ACT and CBT ? ?Client statement of Needs: Coping skills and process emotions ? ?Treatment Level: Outpatient ? ?Goals:    ? ?Work through the grieving process and face reality of own death ?Accept emotional support from others around them ?Live life to the fullest, event though time may be limited ?Become as knowledgeable about the medical condition  ?Reduce fear, anxiety about the health condition  ?Accept the illness ?Accept the role of psychological and behavioral factors  ?Stabilize anxiety level wile increasing ability to function ?Learn and implement coping skills that result in a reduction of anxiety  ?Alleviate depressive symptoms ?Recognize, accept, and cope with depressive feelings ?Develop healthy thinking patterns ?Develop healthy interpersonal relationships ? ?Objectives: Target Date for all Objectives: 01/18/2022 ?Identify feelings associated with the illness  ?Family members share with each other  feelings ?Identify the losses or limitations  that have been experienced ?Verbalize acceptance of the reality of the medical condition ?Commit to learning and implement a proactive approach to managing personal stresses ?Verbalize an understanding of the medical condition ?Work with therapist to develop a plan for coping with stress ?Learn and implement skills for managing stress ?Engage in social, productive activities that are possible ?Engage in faith based activities ?implement positive imagery ?Identify coping skills and sources of emotional support ?Patient's partner and family members verbalize their fears regarding severity of health condition ?Identify sources of emotional distress  ?Learning and implement calming skills to reduce overall anxiety ?Learn and implement problem solving strategies ?Identify and engage in pleasant activities ?Learning and implement personal and interpersonal skills to reduce anxiety and improve interpersonal relationships ?Learn to accept limitations in life and commit to tolerating, rather than avoiding, unpleasant emotions while accomplishing meaningful goals ?Identify major life conflicts from the past and present that form the basis for present anxiety ?Learn and implement behavioral strategies ?Verbalize an understanding and resolution of current interpersonal problems ?Learn and implement problem solving and decision making skills ?Learn and implement conflict resolution skills to resolve interpersonal problems ?Verbalize an understanding of healthy and unhealthy emotions verbalize insight into how past relationships may be influence current experiences with depression ?Use mindfulness and acceptance strategies and increase value based behavior  ?Increase hopeful statements about the future.  ? ? ?Interventions ? ?Teach about stress and ways to handle stress ?Assist the patient in developing a coping action plan for stressors ?Conduct skills based training for coping strategies ?Train problem focused skills ?Sort out  what activities the individual can do ?Encourage patient to rely upon his/her spiritual faith ?Teach the patient to use guided imagery ?Probe and evaluate family's ability to provide emotional support ?Allow family to share their fears ?Assist the patient in identifying, sorting through, and verbalizing the various feelings generated by his/her medical condition ?Meet with family members  ?Ask patient list out limitations  ?Use stress inoculation training  ?Use Acceptance and Commitment Therapy to help client accept uncomfortable realities in order to accomplish value-consistent goals ?Reinforce the client's insight into the role of his/her past emotional pain and present anxiety  ?Discuss examples demonstrating that unrealistic worry overestimates the probability of threats and und3restmiates patient's ability  ?Assist the patient in analyzing his or her worries ?Help patient understand that avoidance is reinforcing  ?Behavioral activation help the client explore the relationship, nature of the dispute,  ?Help the client develop new interpersonal skills and relationships ?Conduct Problem so living therapy ?Teach conflict resolution skills ?Use a process-experiential approach ?Conduct TLDP ?Conduct ACT ? ?The patient reviewed the treatment plan on 02/01/2021. The patient approved of the treatment plan.  ? ? ? ?Conception Chancy, PsyD ? ? ? ? ? ? ? ? ? ? ? ? ? ? ? ? ?Conception Chancy, PsyD ?

## 2021-05-26 NOTE — Progress Notes (Signed)
?Edward [name] D.O. ?[facility] ?[place] ?Phone: [phone] ?Subjective:   ?I, Edward [name], am serving as a scribe for Dr. Hulan Saas. ? ?I'm seeing this patient by the request  of:  Edward Koch, MD ? ?CC: Wrist pain  ? ?[id number]  ?Edward [name] is a 46 y.o. male coming in with complaint of wrist pain. Last seen in October for knee pain. Patient states that the right wrist has been bothering him off and on since august of last year. No MOI, thinks possibly from typing at work or scrolling on the phone. Patient locates pain to the top of the right wrist and will sometimes radiate toward the thumb. Patient has tried rest, a wrist brace, and tens unit. Patient states today a dull ache today at a 4/10 pain, but can get intense and higher in the pain scale. Patient has not numbness or tingling from this localized wrist pain, but is going through cancer treatment and has some neuropathy from that.  ?Reviewed patient's chart and is going under chemotherapy for her cancer with potential surgical intervention in June. ?  ? ?Past Medical History:  ?Diagnosis Date  ? Arthritis   ? OA right knee  ? Family history of prostate cancer   ? Kidney stones   ? has a horse shoe kidney  ? Thyroid disease   ? ?No past surgical history on file. ?Social History  ? ?Socioeconomic History  ? Marital status: Single  ?  Spouse name: Not on file  ? Number of children: Not on file  ? Years of education: Not on file  ? Highest education level: Not on file  ?Occupational History  ? Not on file  ?Tobacco Use  ? Smoking status: Former  ? Smokeless tobacco: Never  ?Vaping Use  ? Vaping Use: Never used  ?Substance and Sexual Activity  ? Alcohol use: Yes  ?  Comment: glass of wine at [facility]  ? Drug use: Never  ? Sexual activity: Not on file  ?Other Topics Concern  ? Not on file  ?Social History Narrative  ? Not on file  ? ?Social Determinants of Health  ? ?Financial Resource Strain: Not on  file  ?Food Insecurity: Not on file  ?Transportation Needs: Not on file  ?Physical Activity: Not on file  ?Stress: Not on file  ?Social Connections: Not on file  ? ?No Known Allergies ?Family History  ?Problem Relation Age of Onset  ? Depression Mother   ? Asthma Father   ? Cancer Father   ? CAD Father   ? Hyperlipidemia Father   ? Hypertension Father   ? Colon cancer Neg Hx   ? Esophageal cancer Neg Hx   ? Rectal cancer Neg Hx   ? Stomach cancer Neg Hx   ? ? ?Current Outpatient Medications (Endocrine & Metabolic):  ?  levothyroxine (SYNTHROID) 75 MCG tablet, Take 1 tablet (75 mcg total) by mouth daily before breakfast. ? ? ? ? ? ?Current Outpatient Medications (Other):  ?  capecitabine (XELODA) 500 MG tablet, Take 1,500 mg by mouth 2 (two) times daily. ?  cholecalciferol (VITAMIN D3) 25 MCG (1000 UT) tablet, Take 2,000 Units by mouth 2 (two) times a day. ?  ondansetron (ZOFRAN) 8 MG tablet, Take 8 mg by mouth. ?  prochlorperazine (COMPAZINE) 10 MG tablet, Take by mouth. ?  traZODone (DESYREL) 50 MG tablet, TAKE 1/2 TO 1 TABLET(25 TO 50 MG) BY MOUTH AT BEDTIME AS NEEDED FOR  SLEEP ? ? ?Reviewed prior external information including notes and imaging from  ?primary care provider ?As well as notes that were available from care everywhere and other healthcare systems.  Reviewed notes from [facility] for his oncology appointments. ? ?Past medical history, social, surgical and family history all reviewed in electronic medical record.  No pertanent information unless stated regarding to the chief complaint.  ? ?Review of Systems: ? No headache, visual changes, nausea, vomiting, diarrhea, constipation, dizziness, abdominal pain, skin rash, fevers, chills, night sweats, weight loss, swollen lymph nodes, body aches, joint swelling, chest pain, shortness of breath, mood changes. POSITIVE muscle aches ? ?Objective  ?Blood pressure 118/78, pulse 67, height '5\' 9"'$  (1.753 m), weight 170 lb (77.1 kg), SpO2 99 %. ?  ?General: No apparent  distress alert and oriented x3 mood and affect normal, dressed appropriately.  ?HEENT: Pupils equal, extraocular movements intact  ?Respiratory: Patient's speak in full sentences and does not appear short of breath  ?Cardiovascular: No lower extremity edema, non tender, no erythema  ?Gait normal with good balance and coordination.  ?MSK: Right wrist exam shows the patient does have some tenderness to palpation noted over the dorsal aspect of the wrist.  Very mild pain over the scaphoid lunate junction.  Mild crepitus noted in this area.  Good grip strength noted. ? ?Limited muscular skeletal ultrasound was performed and interpreted by Hulan Saas, M  ?Limited ultrasound shows no cortical irregularity, no significant abnormality noted of the TFCC.  The patient does have what appears to be some mild hypoechoic changes in the third dorsal compartment consistent with potential tendinitis.  No increase in neovascularization.  No masses appreciated. ?Impression: Tendinitis of the wrist ?  ?Impression and Recommendations:  ?  ? ?The above documentation has been reviewed and is accurate and complete Lyndal Pulley, DO ? ? ? ?

## 2021-06-01 ENCOUNTER — Ambulatory Visit: Payer: Self-pay

## 2021-06-01 ENCOUNTER — Ambulatory Visit: Payer: BC Managed Care – PPO | Admitting: Family Medicine

## 2021-06-01 ENCOUNTER — Encounter: Payer: Self-pay | Admitting: Family Medicine

## 2021-06-01 VITALS — BP 118/78 | HR 67 | Ht 69.0 in | Wt 170.0 lb

## 2021-06-01 DIAGNOSIS — M25531 Pain in right wrist: Secondary | ICD-10-CM

## 2021-06-01 DIAGNOSIS — M778 Other enthesopathies, not elsewhere classified: Secondary | ICD-10-CM | POA: Diagnosis not present

## 2021-06-01 NOTE — Patient Instructions (Addendum)
Good to see you  ?Exercises given ?Coban wrap with activity ?Vertical mouse ?Avoid overhand lifting when able  ?Ask physician about phlebitis  ?Follow up in 6-8 weeks  ?

## 2021-06-01 NOTE — Assessment & Plan Note (Signed)
Discussed with patient about icing regimen and home exercises, discussed with patient about weight taping.  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  Follow-up again in 6 weeks.  Continued hand pain consider occupational therapy.  Patient has other health difficulties and may cancel the appointment if necessary. ?

## 2021-06-07 ENCOUNTER — Ambulatory Visit (INDEPENDENT_AMBULATORY_CARE_PROVIDER_SITE_OTHER): Payer: BC Managed Care – PPO | Admitting: Psychologist

## 2021-06-07 DIAGNOSIS — F330 Major depressive disorder, recurrent, mild: Secondary | ICD-10-CM | POA: Diagnosis not present

## 2021-06-07 NOTE — Progress Notes (Signed)
[facility] Counselor/Therapist Progress Note ? ?Patient ID: Edward [name], MRN: [id number],   ? ?Date: 06/07/2021 ? ?Time Spent: 9:06 am to 9:46 am; total time: 40 minutes ? ?This session was held via in person. The patient consented to in-person therapy and was in the clinician's office. Limits of confidentiality were discussed with the patient.  ? ? ?Treatment Type: Individual Therapy ? ?Reported Symptoms:  Depressive symptoms  ? ?Mental Status Exam: ?Appearance:  Well Groomed     ?Behavior: Appropriate  ?Motor: Normal  ?Speech/Language:  Normal Rate  ?Affect: Appropriate  ?Mood: normal  ?Thought process: normal  ?Thought content:   WNL  ?Sensory/Perceptual disturbances:   WNL  ?Orientation: oriented to person, place, time/date, situation, and day of week  ?Attention: Good  ?Concentration: Good  ?Memory: WNL  ?Fund of knowledge:  Good  ?Insight:   Good  ?Judgment:  Fair  ?Impulse Control: Good  ? ?Risk Assessment: ?Danger to Self:  No ?Self-injurious Behavior: No ?Danger to Others: No ?Duty to Warn:no ?Physical Aggression / Violence:No  ?Access to Firearms a concern: No  ?Gang Involvement:No  ? ?Subjective: Patient described himself as  doing well and denied any immediate concerns. He talked about how he has been researching his surgery and what to expect following the surgery. He briefly voiced that he and [name] are doing well. He processed thoughts and emotions related to cancer treatment and some work events. He asked to follow up after meeting with medical team to process those appointments. He denied suicidal and homicidal ideation. ? ?Interventions:  Worked on developing a therapeutic relationship with the patient. Used active listening and reflective statements. Provided emotional support using empathy and validation. Used summary statements. Reflected on events since the last session. Used socratic questions to assist the patient gain insight into self. Challenged some of the thoughts  expressed. Processed whether research has assisted the patient or impeded ability to function. Processed emotions related to sitting in the unknown and the upcoming surgery. Processed sharing emotions with wife. Used a Product/process development scientist to assist the patient. Assisted in problem solving. Discussed next steps for counseling. Validated expressed thoughts. Provided empathic statements. Assessed for suicidal and homicidal ideation. ? ?Homework: NA ? ?Next Session: Emotional support and review medical appointments ? ?Diagnosis: F33.0 major depressive affective disorder, recurrent, mild ? ?Plan:  ? ?Client Abilities:  Friendly and easy to develop rapport ? ?Client Preferences: ACT and CBT ? ?Client statement of Needs: Coping skills and process emotions ? ?Treatment Level: Outpatient ? ?Goals:    ? ?Work through the grieving process and face reality of own death ?Accept emotional support from others around them ?Live life to the fullest, event though time may be limited ?Become as knowledgeable about the medical condition  ?Reduce fear, anxiety about the health condition  ?Accept the illness ?Accept the role of psychological and behavioral factors  ?Stabilize anxiety level wile increasing ability to function ?Learn and implement coping skills that result in a reduction of anxiety  ?Alleviate depressive symptoms ?Recognize, accept, and cope with depressive feelings ?Develop healthy thinking patterns ?Develop healthy interpersonal relationships ? ?Objectives: Target Date for all Objectives: 01/18/2022 ?Identify feelings associated with the illness  ?Family members share with each other feelings ?Identify the losses or limitations that have been experienced ?Verbalize acceptance of the reality of the medical condition ?Commit to learning and implement a proactive approach to managing personal stresses ?Verbalize an understanding of the medical condition ?Work with therapist to develop a plan for coping with stress ?  Learn and implement  skills for managing stress ?Engage in social, productive activities that are possible ?Engage in faith based activities ?implement positive imagery ?Identify coping skills and sources of emotional support ?Patient's partner and family members verbalize their fears regarding severity of health condition ?Identify sources of emotional distress  ?Learning and implement calming skills to reduce overall anxiety ?Learn and implement problem solving strategies ?Identify and engage in pleasant activities ?Learning and implement personal and interpersonal skills to reduce anxiety and improve interpersonal relationships ?Learn to accept limitations in life and commit to tolerating, rather than avoiding, unpleasant emotions while accomplishing meaningful goals ?Identify major life conflicts from the past and present that form the basis for present anxiety ?Learn and implement behavioral strategies ?Verbalize an understanding and resolution of current interpersonal problems ?Learn and implement problem solving and decision making skills ?Learn and implement conflict resolution skills to resolve interpersonal problems ?Verbalize an understanding of healthy and unhealthy emotions verbalize insight into how past relationships may be influence current experiences with depression ?Use mindfulness and acceptance strategies and increase value based behavior  ?Increase hopeful statements about the future.  ? ? ?Interventions ? ?Teach about stress and ways to handle stress ?Assist the patient in developing a coping action plan for stressors ?Conduct skills based training for coping strategies ?Train problem focused skills ?Sort out what activities the individual can do ?Encourage patient to rely upon his/her spiritual faith ?Teach the patient to use guided imagery ?Probe and evaluate family's ability to provide emotional support ?Allow family to share their fears ?Assist the patient in identifying, sorting through, and verbalizing the  various feelings generated by his/her medical condition ?Meet with family members  ?Ask patient list out limitations  ?Use stress inoculation training  ?Use Acceptance and Commitment Therapy to help client accept uncomfortable realities in order to accomplish value-consistent goals ?Reinforce the client's insight into the role of his/her past emotional pain and present anxiety  ?Discuss examples demonstrating that unrealistic worry overestimates the probability of threats and und3restmiates patient's ability  ?Assist the patient in analyzing his or her worries ?Help patient understand that avoidance is reinforcing  ?Behavioral activation help the client explore the relationship, nature of the dispute,  ?Help the client develop new interpersonal skills and relationships ?Conduct Problem so living therapy ?Teach conflict resolution skills ?Use a process-experiential approach ?Conduct TLDP ?Conduct ACT ? ?The patient reviewed the treatment plan on 02/01/2021. The patient approved of the treatment plan.  ? ? ? ?Conception Chancy, PsyD ? ? ?

## 2021-07-01 ENCOUNTER — Ambulatory Visit: Payer: BC Managed Care – PPO | Admitting: Family Medicine

## 2021-07-06 ENCOUNTER — Ambulatory Visit (INDEPENDENT_AMBULATORY_CARE_PROVIDER_SITE_OTHER): Payer: BC Managed Care – PPO | Admitting: Psychologist

## 2021-07-06 DIAGNOSIS — F330 Major depressive disorder, recurrent, mild: Secondary | ICD-10-CM | POA: Diagnosis not present

## 2021-07-06 NOTE — Progress Notes (Signed)
[place] Counselor/Therapist Progress Note  Patient ID: Edward [name], MRN: [id number],    Date: 07/06/2021  Time Spent: 9:05 am to 9:52 am; total time: 47 minutes  This session was held via in person. The patient consented to in-person therapy and was in the clinician's office. Limits of confidentiality were discussed with the patient.    Treatment Type: Individual Therapy  Reported Symptoms:  Depressive symptoms   Mental Status Exam: Appearance:  Well Groomed     Behavior: Appropriate  Motor: Normal  Speech/Language:  Normal Rate  Affect: Appropriate  Mood: normal  Thought process: normal  Thought content:   WNL  Sensory/Perceptual disturbances:   WNL  Orientation: oriented to person, place, time/date, situation, and day of week  Attention: Good  Concentration: Good  Memory: WNL  Fund of knowledge:  Good  Insight:   Good  Judgment:  Fair  Impulse Control: Good   Risk Assessment: Danger to Self:  No Self-injurious Behavior: No Danger to Others: No Duty to Warn:no Physical Aggression / Violence:No  Access to Firearms a concern: No  Gang Involvement:No   Subjective: Patient described himself as  doing well while reflecting on different events since the last session. He disclosed that test results have indicated that the tumor has shrunk and that currently there are no signs of the cancer spreading. He met with one surgeon and is scheduled to meet with another surgeon later today. Continuing to talk, he reflected on other positive changes in his life. He processed thoughts and emotions. He stated he would contact the clinician after meeting with surgeon regarding scheduling another appointment.  He denied suicidal and homicidal ideation.  Interventions:  Worked on developing a therapeutic relationship with the patient. Used active listening and reflective statements. Provided emotional support using empathy and validation. Reviewed events since the last  session. Praised the patient for doing well and receiving positive test results. Processed thoughts and emotions related to positive test results. Used socratic questions to assist the patient. Reviewed thoughts after meeting with one surgeon. Assisted in problem solving. Discussed the other positives that have been going well in the patient's life. Processed next steps for counseling. Reflected on patient's growth in counseling. Normalized and validated expressed thoughts and emotions. Provided empathic statements. Assessed for suicidal and homicidal ideation.  Homework: NA  Next Session: NA. Patient will contact clinician to set up next appointment following meeting with surgeon.   Diagnosis: F33.0 major depressive affective disorder, recurrent, mild  Plan:   Client Abilities:  Friendly and easy to develop rapport  Client Preferences: ACT and CBT  Client statement of Needs: Coping skills and process emotions  Treatment Level: Outpatient  Goals:     Work through the grieving process and face reality of own death Accept emotional support from others around them Live life to the fullest, event though time may be limited Become as knowledgeable about the medical condition  Reduce fear, anxiety about the health condition  Accept the illness Accept the role of psychological and behavioral factors  Stabilize anxiety level wile increasing ability to function Learn and implement coping skills that result in a reduction of anxiety  Alleviate depressive symptoms Recognize, accept, and cope with depressive feelings Develop healthy thinking patterns Develop healthy interpersonal relationships  Objectives: Target Date for all Objectives: 01/18/2022 Identify feelings associated with the illness  Family members share with each other feelings Identify the losses or limitations that have been experienced Verbalize acceptance of the reality of the medical condition  Commit to learning and  implement a proactive approach to managing personal stresses Verbalize an understanding of the medical condition Work with therapist to develop a plan for coping with stress Learn and implement skills for managing stress Engage in social, productive activities that are possible Engage in faith based activities implement positive imagery Identify coping skills and sources of emotional support Patient's partner and family members verbalize their fears regarding severity of health condition Identify sources of emotional distress  Learning and implement calming skills to reduce overall anxiety Learn and implement problem solving strategies Identify and engage in pleasant activities Learning and implement personal and interpersonal skills to reduce anxiety and improve interpersonal relationships Learn to accept limitations in life and commit to tolerating, rather than avoiding, unpleasant emotions while accomplishing meaningful goals Identify major life conflicts from the past and present that form the basis for present anxiety Learn and implement behavioral strategies Verbalize an understanding and resolution of current interpersonal problems Learn and implement problem solving and decision making skills Learn and implement conflict resolution skills to resolve interpersonal problems Verbalize an understanding of healthy and unhealthy emotions verbalize insight into how past relationships may be influence current experiences with depression Use mindfulness and acceptance strategies and increase value based behavior  Increase hopeful statements about the future.    Interventions  Teach about stress and ways to handle stress Assist the patient in developing a coping action plan for stressors Conduct skills based training for coping strategies Train problem focused skills Sort out what activities the individual can do Encourage patient to rely upon his/her spiritual faith Teach the patient  to use guided imagery Probe and evaluate family's ability to provide emotional support Allow family to share their fears Assist the patient in identifying, sorting through, and verbalizing the various feelings generated by his/her medical condition Meet with family members  Ask patient list out limitations  Use stress inoculation training  Use Acceptance and Commitment Therapy to help client accept uncomfortable realities in order to accomplish value-consistent goals Reinforce the client's insight into the role of his/her past emotional pain and present anxiety  Discuss examples demonstrating that unrealistic worry overestimates the probability of threats and und3restmiates patient's ability  Assist the patient in analyzing his or her worries Help patient understand that avoidance is reinforcing  Behavioral activation help the client explore the relationship, nature of the dispute,  Help the client develop new interpersonal skills and relationships Conduct Problem so living therapy Teach conflict resolution skills Use a process-experiential approach Conduct TLDP Conduct ACT  The patient reviewed the treatment plan on 02/01/2021. The patient approved of the treatment plan.     Conception Chancy, PsyD

## 2021-07-28 ENCOUNTER — Encounter: Payer: Self-pay | Admitting: Internal Medicine

## 2021-08-03 NOTE — Telephone Encounter (Signed)
Pt is wondering if you have any recommendations on dermatologists for a suspicious spot he would like checked out?

## 2021-08-11 ENCOUNTER — Ambulatory Visit (INDEPENDENT_AMBULATORY_CARE_PROVIDER_SITE_OTHER): Payer: BC Managed Care – PPO | Admitting: Internal Medicine

## 2021-08-11 ENCOUNTER — Encounter: Payer: Self-pay | Admitting: Internal Medicine

## 2021-08-11 DIAGNOSIS — I809 Phlebitis and thrombophlebitis of unspecified site: Secondary | ICD-10-CM | POA: Insufficient documentation

## 2021-08-11 DIAGNOSIS — D229 Melanocytic nevi, unspecified: Secondary | ICD-10-CM | POA: Diagnosis not present

## 2021-08-11 DIAGNOSIS — I808 Phlebitis and thrombophlebitis of other sites: Secondary | ICD-10-CM

## 2021-08-11 MED ORDER — AMOXICILLIN-POT CLAVULANATE 875-125 MG PO TABS: 1.0000 | ORAL_TABLET | Freq: Two times a day (BID) | ORAL | 0 refills | Status: DC

## 2021-08-11 NOTE — Assessment & Plan Note (Signed)
Suspect AK on right leg. This is growing in the last month and given recent chemotherapy needs dermatology evaluation. Has visit scheduled late July okay to keep.

## 2021-08-11 NOTE — Assessment & Plan Note (Signed)
Related to chemotherapy administration. Rx augmentin 7 day course today. Advised heat and voltaren gel to help. This is present for about 2 months now and we talked about possible 3-4 months to resolution.

## 2021-08-11 NOTE — Patient Instructions (Signed)
We have sent in the augmentin to take 1 pill twice a day for 1 week. Okay to use heat on the wrist and okay to use voltaren gel over the counter up to 3 times a day on the area.

## 2021-08-11 NOTE — Progress Notes (Signed)
   Subjective:   Patient ID: Edward [name], male    DOB: [birth year], 46 y.o.   MRN: [id number]  Wrist Injury  Pertinent negatives include no chest pain.   The 46 YO man coming in for right wrist pain and right leg mole.   Review of Systems  Constitutional: Negative.   HENT: Negative.    Eyes: Negative.   Respiratory:  Negative for cough, chest tightness and shortness of breath.   Cardiovascular:  Negative for chest pain, palpitations and leg swelling.  Gastrointestinal:  Negative for abdominal distention, abdominal pain, constipation, diarrhea, nausea and vomiting.  Musculoskeletal:  Positive for myalgias.  Skin: Negative.   Neurological: Negative.   Psychiatric/Behavioral: Negative.      Objective:  Physical Exam Constitutional:      Appearance: He is well-developed.  HENT:     Head: Normocephalic and atraumatic.  Cardiovascular:     Rate and Rhythm: Normal rate and regular rhythm.  Pulmonary:     Effort: Pulmonary effort is normal. No respiratory distress.     Breath sounds: Normal breath sounds. No wheezing or rales.  Abdominal:     General: Bowel sounds are normal. There is no distension.     Palpations: Abdomen is soft.     Tenderness: There is no abdominal tenderness. There is no rebound.  Musculoskeletal:        General: Tenderness present.     Cervical back: Normal range of motion.  Skin:    General: Skin is warm and dry.     Findings: Lesion present.  Neurological:     Mental Status: He is alert and oriented to person, place, and time.     Coordination: Coordination normal.     Vitals:   08/11/21 0848  BP: 118/80  Pulse: (!) 56  Resp: 18  SpO2: 97%  Weight: 169 lb 3.2 oz (76.7 kg)  Height: '5\' 9"'$  (1.753 m)    Assessment & Plan:

## 2021-09-02 ENCOUNTER — Encounter: Payer: Self-pay | Admitting: Internal Medicine

## 2021-10-11 ENCOUNTER — Telehealth: Payer: Self-pay

## 2021-10-11 NOTE — Telephone Encounter (Signed)
Transition Care Management Follow-up Telephone Call Date of discharge and from where: [facility], 10/10/2021. How have you been since you were released from the hospital? NO. Any questions or concerns? No  Items Reviewed: Did the pt receive and understand the discharge instructions provided? Yes  Medications obtained and verified? Yes  Other? No  Any new allergies since your discharge? No  Dietary orders reviewed? Yes Do you have support at home? Yes   Home Care and Equipment/Supplies: Were home health services ordered? no If so, what is the name of the agency? N/A  Has the agency set up a time to come to the patient's home? not applicable Were any new equipment or medical supplies ordered?  No What is the name of the medical supply agency? N/A Were you able to get the supplies/equipment? not applicable Do you have any questions related to the use of the equipment or supplies? No  Functional Questionnaire: (I = Independent and D = Dependent) ADLs: I  Bathing/Dressing- I  Meal Prep- I  Eating- I  Maintaining continence- I  Transferring/Ambulation- I  Managing Meds- I  Follow up appointments reviewed:  PCP Hospital f/u appt confirmed? No   Specialist Hospital f/u appt confirmed? Yes   Are transportation arrangements needed? No  If their condition worsens, is the pt aware to call PCP or go to the Emergency Dept.? Yes Was the patient provided with contact information for the PCP's office or ED? Yes Was to pt encouraged to call back with questions or concerns? Yes

## 2021-10-27 ENCOUNTER — Encounter: Payer: Self-pay | Admitting: Internal Medicine

## 2021-11-01 ENCOUNTER — Ambulatory Visit (INDEPENDENT_AMBULATORY_CARE_PROVIDER_SITE_OTHER): Payer: BC Managed Care – PPO | Admitting: Internal Medicine

## 2021-11-01 ENCOUNTER — Encounter: Payer: Self-pay | Admitting: Internal Medicine

## 2021-11-01 DIAGNOSIS — F4323 Adjustment disorder with mixed anxiety and depressed mood: Secondary | ICD-10-CM

## 2021-11-01 DIAGNOSIS — E039 Hypothyroidism, unspecified: Secondary | ICD-10-CM

## 2021-11-01 DIAGNOSIS — R194 Change in bowel habit: Secondary | ICD-10-CM | POA: Diagnosis not present

## 2021-11-01 NOTE — Progress Notes (Signed)
   Subjective:   Patient ID: Edward [name], male    DOB: 02/25/1975, 46 y.o.   MRN: [id number]  HPI The patient is a 46 YO man coming in for follow up recent surgery. Overall improving but was not healing well initially and had to have additional drain placed. Stoma in place.  Review of Systems  Constitutional: Negative.   HENT: Negative.    Eyes: Negative.   Respiratory:  Negative for cough, chest tightness and shortness of breath.   Cardiovascular:  Negative for chest pain, palpitations and leg swelling.  Gastrointestinal:  Negative for abdominal distention, abdominal pain, constipation, diarrhea, nausea and vomiting.  Musculoskeletal: Negative.   Skin:  Positive for wound.  Neurological: Negative.   Psychiatric/Behavioral:  Positive for dysphoric mood. The patient is nervous/anxious.     Objective:  Physical Exam Constitutional:      Appearance: He is well-developed.  HENT:     Head: Normocephalic and atraumatic.  Cardiovascular:     Rate and Rhythm: Normal rate and regular rhythm.  Pulmonary:     Effort: Pulmonary effort is normal. No respiratory distress.     Breath sounds: Normal breath sounds. No wheezing or rales.  Abdominal:     General: Bowel sounds are normal. There is no distension.     Palpations: Abdomen is soft.     Tenderness: There is no abdominal tenderness. There is no rebound.     Comments: Two wound visualized without signs of infection, good granulation tissue and healthy skin bed.  Musculoskeletal:     Cervical back: Normal range of motion.  Skin:    General: Skin is warm and dry.  Neurological:     Mental Status: He is alert and oriented to person, place, and time.     Coordination: Coordination normal.     Vitals:   11/01/21 1350  BP: 118/82  Pulse: 87  Temp: 97.7 F (36.5 C)  SpO2: 97%    Assessment & Plan:

## 2021-11-01 NOTE — Patient Instructions (Signed)
We will bring you back for physical.

## 2021-11-01 NOTE — Telephone Encounter (Signed)
Patient doesn't need to be routed. She will be seen today @ 1:40 for Dr Edward [name].

## 2021-11-04 DIAGNOSIS — F4320 Adjustment disorder, unspecified: Secondary | ICD-10-CM | POA: Insufficient documentation

## 2021-11-04 NOTE — Assessment & Plan Note (Signed)
Recent levels stable on synthroid 75 mcg daily. Continue.

## 2021-11-04 NOTE — Assessment & Plan Note (Signed)
Reviewed wound healing. Advised multivitamin and keep covered to promote healing. No infection today.

## 2021-11-04 NOTE — Assessment & Plan Note (Signed)
Used ativan prn during chemo and may need refill. Will let us know am comfortable filling.

## 2021-11-08 ENCOUNTER — Telehealth: Payer: Self-pay

## 2021-11-08 NOTE — Telephone Encounter (Signed)
Patient called in stating that he had a drain placed on R Buttock at [facility] on about 3 weeks ago. The adhesive is coming lose on the drain and patient wants to know if Dr.Crawford would be able to take a look at it or if he needs to schedule an appointment with [facility].

## 2021-11-09 NOTE — Telephone Encounter (Signed)
Spoke to pt--agreed to call [facility] for appt.

## 2021-11-09 NOTE — Telephone Encounter (Signed)
Please advise 

## 2021-11-09 NOTE — Telephone Encounter (Signed)
I would recommend he call [facility] first to see if they suggest he replace the adhesive on the drain or if they need to see this.

## 2021-11-12 ENCOUNTER — Other Ambulatory Visit: Payer: Self-pay

## 2021-11-12 ENCOUNTER — Emergency Department (HOSPITAL_BASED_OUTPATIENT_CLINIC_OR_DEPARTMENT_OTHER): Payer: BC Managed Care – PPO

## 2021-11-12 ENCOUNTER — Encounter (HOSPITAL_BASED_OUTPATIENT_CLINIC_OR_DEPARTMENT_OTHER): Payer: Self-pay

## 2021-11-12 ENCOUNTER — Emergency Department (HOSPITAL_BASED_OUTPATIENT_CLINIC_OR_DEPARTMENT_OTHER)
Admission: EM | Admit: 2021-11-12 | Discharge: 2021-11-13 | Disposition: A | Discharge status: Home / Self Care | Payer: BC Managed Care – PPO | Attending: Emergency Medicine | Admitting: Emergency Medicine

## 2021-11-12 DIAGNOSIS — Z79899 Other long term (current) drug therapy: Secondary | ICD-10-CM | POA: Insufficient documentation

## 2021-11-12 DIAGNOSIS — Z87442 Personal history of urinary calculi: Secondary | ICD-10-CM | POA: Insufficient documentation

## 2021-11-12 DIAGNOSIS — M79661 Pain in right lower leg: Secondary | ICD-10-CM | POA: Diagnosis present

## 2021-11-12 DIAGNOSIS — E039 Hypothyroidism, unspecified: Secondary | ICD-10-CM | POA: Insufficient documentation

## 2021-11-12 LAB — CBC WITH DIFFERENTIAL/PLATELET
Abs Immature Granulocytes: 0.04 10*3/uL (ref 0.00–0.07)
Basophils Absolute: 0 10*3/uL (ref 0.0–0.1)
Basophils Relative: 0 %
Eosinophils Absolute: 0.1 10*3/uL (ref 0.0–0.5)
Eosinophils Relative: 2 %
HCT: 38.4 % — ABNORMAL LOW (ref 39.0–52.0)
Hemoglobin: 12.8 g/dL — ABNORMAL LOW (ref 13.0–17.0)
Immature Granulocytes: 1 %
Lymphocytes Relative: 11 %
Lymphs Abs: 0.6 10*3/uL — ABNORMAL LOW (ref 0.7–4.0)
MCH: 30.4 pg (ref 26.0–34.0)
MCHC: 33.3 g/dL (ref 30.0–36.0)
MCV: 91.2 fL (ref 80.0–100.0)
Monocytes Absolute: 0.3 10*3/uL (ref 0.1–1.0)
Monocytes Relative: 7 %
Neutro Abs: 4.1 10*3/uL (ref 1.7–7.7)
Neutrophils Relative %: 79 %
Platelets: 148 10*3/uL — ABNORMAL LOW (ref 150–400)
RBC: 4.21 MIL/uL — ABNORMAL LOW (ref 4.22–5.81)
RDW: 12.6 % (ref 11.5–15.5)
WBC: 5.1 10*3/uL (ref 4.0–10.5)
nRBC: 0 % (ref 0.0–0.2)

## 2021-11-12 LAB — PROTIME-INR
INR: 1 (ref 0.8–1.2)
Prothrombin Time: 13.3 seconds (ref 11.4–15.2)

## 2021-11-12 LAB — BASIC METABOLIC PANEL
Anion gap: 10 (ref 5–15)
BUN: 18 mg/dL (ref 6–20)
CO2: 29 mmol/L (ref 22–32)
Calcium: 9.4 mg/dL (ref 8.9–10.3)
Chloride: 103 mmol/L (ref 98–111)
Creatinine, Ser: 0.82 mg/dL (ref 0.61–1.24)
GFR, Estimated: >60 mL/min (ref >60)
Glucose, Bld: 113 mg/dL — ABNORMAL HIGH (ref 70–99)
Potassium: 4 mmol/L (ref 3.5–5.1)
Sodium: 142 mmol/L (ref 135–145)

## 2021-11-12 NOTE — ED Triage Notes (Signed)
Patient c/o calf pain right calf pain x this am when he woke up. Patient had recent surgery x 1 mo - (rectal CA patient).  Patient states he has been noncompliant with blood thinners. States did take Lovenox this am.

## 2021-11-13 NOTE — ED Notes (Signed)
ED Provider at bedside. 

## 2021-11-13 NOTE — ED Provider Notes (Signed)
[facility] EMERGENCY DEPT Provider Note  CSN: [id number] Arrival date & time: 11/12/21 2022  Chief Complaint(s) Leg Pain  HPI Edward [name] is a 46 y.o. male with a past medical history listed below including rectal cancer status post resection following recent surgery who is supposed to be on prophylactic Lovenox but was intermittently noncompliant who presents to the ED with 1 day of right calf pain.  No swelling.  He denies any fall or trauma.  No redness.  Concern for possible DVT.  The history is provided by the patient.    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   Thyroid disease    Patient Active Problem List   Diagnosis Date Noted   Adjustment disorder 11/04/2021   Superficial thrombophlebitis 08/11/2021   Skin mole 08/11/2021   Right wrist tendinitis 06/01/2021   Routine general medical examination at a health care facility 01/05/2021   Genetic testing 12/28/2020   AC joint pain 11/24/2020   Bowel habit changes 09/22/2020   Insomnia 09/22/2020   Hypothyroid 09/22/2020   Nonallopathic lesion of thoracic region 11/25/2018   Nonallopathic lesion of sacral region 11/25/2018   Nonallopathic lesion of lumbosacral region 11/25/2018   Nonallopathic lesion of rib cage 11/25/2018   Nonallopathic lesion of cervical region 11/25/2018   Trigger point of left shoulder region 08/29/2018   Patellofemoral arthralgia of right knee 07/25/2018   Scapular dyskinesis 07/25/2018   Home Medication(s) Prior to Admission medications   Medication Sig Start Date End Date Taking? Authorizing Provider  cholecalciferol (VITAMIN D3) 25 MCG (1000 UT) tablet Take 2,000 Units by mouth 2 (two) times a day.    [provider]  levothyroxine (SYNTHROID) 75 MCG tablet Take 1 tablet (75 mcg total) by mouth daily before breakfast. 01/05/21    Hoyt Koch, MD  oxyCODONE (OXY IR/ROXICODONE) 5 MG immediate release tablet Take by mouth. Patient not taking: Reported on 11/01/2021 10/21/21   [provider]  traZODone (DESYREL) 50 MG tablet TAKE 1/2 TO 1 TABLET(25 TO 50 MG) BY MOUTH AT BEDTIME AS NEEDED FOR SLEEP 01/05/21   Hoyt Koch, MD  vitamin B-12 (CYANOCOBALAMIN) 1000 MCG tablet Take 1,000 mcg by mouth daily.    [provider]                                                                                                                                    Allergies Patient has no known allergies.  Review of Systems Review of Systems As noted in HPI  Physical Exam Vital Signs  I have reviewed the triage vital signs BP 125/84   Pulse 77   Temp 98.9 F (37.2 C) (Oral)   Resp 16   Ht '5\' 9"'$  (1.753 m)  Wt 72.6 kg   SpO2 100%   BMI 23.63 kg/m   Physical Exam Vitals reviewed.  Constitutional:      General: He is not in acute distress.    Appearance: He is well-developed. He is not diaphoretic.  HENT:     Head: Normocephalic and atraumatic.     Right Ear: External ear normal.     Left Ear: External ear normal.     Nose: Nose normal.     Mouth/Throat:     Mouth: Mucous membranes are moist.  Eyes:     General: No scleral icterus.    Conjunctiva/sclera: Conjunctivae normal.  Neck:     Trachea: Phonation normal.  Cardiovascular:     Rate and Rhythm: Normal rate and regular rhythm.  Pulmonary:     Effort: Pulmonary effort is normal. No respiratory distress.     Breath sounds: No stridor.  Abdominal:     General: There is no distension.  Musculoskeletal:        General: Normal range of motion.     Cervical back: Normal range of motion.     Right lower leg: Tenderness (mild) present. No swelling or bony tenderness. No edema.     Right foot: Normal pulse.     Left foot: Normal pulse.       Legs:  Neurological:     Mental Status: He is alert and oriented to person, place, and  time.  Psychiatric:        Behavior: Behavior normal.     ED Results and Treatments Labs (all labs ordered are listed, but only abnormal results are displayed) Labs Reviewed  CBC WITH DIFFERENTIAL/PLATELET - Abnormal; Notable for the following components:      Result Value   RBC 4.21 (*)    Hemoglobin 12.8 (*)    HCT 38.4 (*)    Platelets 148 (*)    Lymphs Abs 0.6 (*)    All other components within normal limits  BASIC METABOLIC PANEL - Abnormal; Notable for the following components:   Glucose, Bld 113 (*)    All other components within normal limits  PROTIME-INR                                                                                                                         EKG  EKG Interpretation  Date/Time:    Ventricular Rate:    PR Interval:    QRS Duration:   QT Interval:    QTC Calculation:   R Axis:     Text Interpretation:         Radiology US Venous Img Lower Unilateral Right  Result Date: 11/12/2021 CLINICAL DATA:  Right calf pain for 1 day. EXAM: RIGHT LOWER EXTREMITY VENOUS DOPPLER ULTRASOUND TECHNIQUE: Gray-scale sonography with compression, as well as color and duplex ultrasound, were performed to evaluate the deep venous system(s) from the level of the common femoral vein through the popliteal and proximal calf veins. COMPARISON:  None Available.  FINDINGS: VENOUS Normal compressibility of the common femoral, superficial femoral, and popliteal veins, as well as the visualized calf veins. Visualized portions of profunda femoral vein and great saphenous vein unremarkable. No filling defects to suggest DVT on grayscale or color Doppler imaging. Doppler waveforms show normal direction of venous flow, normal respiratory plasticity and response to augmentation. Limited views of the contralateral common femoral vein are unremarkable. OTHER None. Limitations: none IMPRESSION: Negative. Electronically Signed   By: Brett Fairy M.D.   On: 11/12/2021 21:29     Medications Ordered in ED Medications - No data to display                                                                                                                                   Procedures Procedures  (including critical care time)  Medical Decision Making / ED Course   Medical Decision Making Amount and/or Complexity of Data Reviewed Labs: ordered. Decision-making details documented in ED Course. Radiology: ordered and independent interpretation performed. Decision-making details documented in ED Course.    Right leg pain and a cancer patient. Intact pulses.  Concerning for arterial occlusion.  No evidence of cellulitis or superficial thrombophlebitis. No bony tenderness concerning for metastatic bone disease. Will rule out DVT  CBC without leukocytosis or anemia Metabolic panel without significant electrolyte derangement or renal sufficiency Ultrasound negative for DVT.  Likely muscular in nature      Final Clinical Impression(s) / ED Diagnoses Final diagnoses:  Right calf pain   The patient appears reasonably screened and/or stabilized for discharge and I doubt any other medical condition or other [facility] requiring further screening, evaluation, or treatment in the ED at this time. I have discussed the findings, Dx and Tx plan with the patient/family who expressed understanding and agree(s) with the plan. Discharge instructions discussed at length. The patient/family was given strict return precautions who verbalized understanding of the instructions. No further questions at time of discharge.  Disposition: Discharge  Condition: Good  ED Discharge Orders     None        Follow Up: Hoyt Koch, MD [place] [id number] [phone]  Call  to schedule an appointment for close follow up            This chart was dictated using voice recognition software.  Despite best efforts to proofread,  errors can occur which  can change the documentation meaning.    Fatima Blank, MD 11/13/21 [phone]

## 2021-11-13 NOTE — ED Notes (Signed)
Pt agreeable with d/c plan as discussed by provider- this nurse has verbally reinforced d/c instructions and provided pt with written copy- pt acknowledged verbal understanding and denies any addl questions concerns needs - pt ambulatory independently at d/c with steady gait; vitals stable; no distress.

## 2021-11-18 ENCOUNTER — Telehealth: Payer: Self-pay | Admitting: *Deleted

## 2021-11-25 ENCOUNTER — Ambulatory Visit (INDEPENDENT_AMBULATORY_CARE_PROVIDER_SITE_OTHER): Payer: BC Managed Care – PPO | Admitting: Internal Medicine

## 2021-11-25 ENCOUNTER — Encounter: Payer: Self-pay | Admitting: Internal Medicine

## 2021-11-25 VITALS — BP 108/82 | HR 88 | Ht 69.0 in | Wt 160.0 lb

## 2021-11-25 DIAGNOSIS — Z23 Encounter for immunization: Secondary | ICD-10-CM

## 2021-11-25 DIAGNOSIS — F4323 Adjustment disorder with mixed anxiety and depressed mood: Secondary | ICD-10-CM | POA: Diagnosis not present

## 2021-11-25 MED ORDER — DULOXETINE HCL 20 MG PO CPEP: 20.0000 mg | ORAL_CAPSULE | Freq: Two times a day (BID) | ORAL | 3 refills | Status: DC

## 2021-11-25 NOTE — Patient Instructions (Addendum)
We have sent in cymbalta to start with 1 pill daily for 1 week. Then increase to 20 mg twice a day. Let us know at 4 weeks.

## 2021-11-25 NOTE — Progress Notes (Signed)
   Subjective:   Patient ID: Edward [name], male    DOB: September 21, 1975, 46 y.o.   MRN: [id number]  HPI The patient is a 46 YO man coming in for ongoing depression/anxiety during cancer treatment. Drain removed recently which helps. Previously took cymbalta for neuropathy and this may have helped.   Review of Systems  Constitutional: Negative.   HENT: Negative.    Eyes: Negative.   Respiratory:  Negative for cough, chest tightness and shortness of breath.   Cardiovascular:  Negative for chest pain, palpitations and leg swelling.  Gastrointestinal:  Negative for abdominal distention, abdominal pain, constipation, diarrhea, nausea and vomiting.  Musculoskeletal: Negative.   Skin: Negative.   Neurological: Negative.   Psychiatric/Behavioral:  Positive for decreased concentration and dysphoric mood.     Objective:  Physical Exam Constitutional:      Appearance: He is well-developed.  HENT:     Head: Normocephalic and atraumatic.  Cardiovascular:     Rate and Rhythm: Normal rate and regular rhythm.  Pulmonary:     Effort: Pulmonary effort is normal. No respiratory distress.     Breath sounds: Normal breath sounds. No wheezing or rales.  Abdominal:     General: Bowel sounds are normal. There is no distension.     Palpations: Abdomen is soft.     Tenderness: There is no abdominal tenderness. There is no rebound.  Musculoskeletal:     Cervical back: Normal range of motion.  Skin:    General: Skin is warm and dry.  Neurological:     Mental Status: He is alert and oriented to person, place, and time.     Coordination: Coordination normal.     Vitals:   11/25/21 0904  BP: 108/82  Pulse: 88  SpO2: 98%  Weight: 160 lb (72.6 kg)  Height: '5\' 9"'$  (1.753 m)    Assessment & Plan:  Flu shot given at visit

## 2021-11-26 ENCOUNTER — Encounter: Payer: Self-pay | Admitting: Internal Medicine

## 2021-11-26 NOTE — Assessment & Plan Note (Signed)
Rx cymbalta 20 mg daily for 1 week then increase to 20 mg BID. He may have decreased absorption but we discussed worse case would be lack of efficacy. We know this has helped in the past and may co-treat neuropathy as his gabapentin is making him sedated.

## 2021-12-20 ENCOUNTER — Other Ambulatory Visit: Payer: Self-pay | Admitting: Internal Medicine

## 2021-12-27 ENCOUNTER — Encounter: Payer: Self-pay | Admitting: Internal Medicine

## 2021-12-27 DIAGNOSIS — Z0000 Encounter for general adult medical examination without abnormal findings: Secondary | ICD-10-CM

## 2021-12-27 DIAGNOSIS — E039 Hypothyroidism, unspecified: Secondary | ICD-10-CM

## 2022-01-03 ENCOUNTER — Other Ambulatory Visit (INDEPENDENT_AMBULATORY_CARE_PROVIDER_SITE_OTHER): Payer: BC Managed Care – PPO

## 2022-01-03 DIAGNOSIS — Z0000 Encounter for general adult medical examination without abnormal findings: Secondary | ICD-10-CM

## 2022-01-03 DIAGNOSIS — E039 Hypothyroidism, unspecified: Secondary | ICD-10-CM | POA: Diagnosis not present

## 2022-01-03 LAB — LIPID PANEL
Cholesterol: 154 mg/dL (ref 0–200)
HDL: 50.8 mg/dL (ref >39.00)
LDL Cholesterol: 67 mg/dL (ref 0–99)
NonHDL: 102.89
Total CHOL/HDL Ratio: 3
Triglycerides: 178 mg/dL — ABNORMAL HIGH (ref 0.0–149.0)
VLDL: 35.6 mg/dL (ref 0.0–40.0)

## 2022-01-03 LAB — TSH: TSH: 0.85 u[IU]/mL (ref 0.35–5.50)

## 2022-01-10 ENCOUNTER — Encounter: Payer: Self-pay | Admitting: Internal Medicine

## 2022-01-10 ENCOUNTER — Ambulatory Visit (INDEPENDENT_AMBULATORY_CARE_PROVIDER_SITE_OTHER): Payer: BC Managed Care – PPO | Admitting: Internal Medicine

## 2022-01-10 VITALS — BP 112/68 | HR 68 | Temp 98.2°F | Ht 69.0 in | Wt 161.0 lb

## 2022-01-10 DIAGNOSIS — Z0000 Encounter for general adult medical examination without abnormal findings: Secondary | ICD-10-CM

## 2022-01-10 DIAGNOSIS — F4323 Adjustment disorder with mixed anxiety and depressed mood: Secondary | ICD-10-CM

## 2022-01-10 NOTE — Assessment & Plan Note (Signed)
Doing well with cymbalta 20 mg BID. Will continue.

## 2022-01-10 NOTE — Progress Notes (Signed)
   Subjective:   Patient ID: Edward [name], male    DOB: [birth year], 46 y.o.   MRN: [id number]  HPI The patient is here for physical.  PMH, [facility], social history reviewed and updated  Review of Systems  Constitutional: Negative.   HENT: Negative.    Eyes: Negative.   Respiratory:  Negative for cough, chest tightness and shortness of breath.   Cardiovascular:  Negative for chest pain, palpitations and leg swelling.  Gastrointestinal:  Negative for abdominal distention, abdominal pain, constipation, diarrhea, nausea and vomiting.  Musculoskeletal: Negative.   Skin: Negative.   Neurological: Negative.   Psychiatric/Behavioral: Negative.      Objective:  Physical Exam Constitutional:      Appearance: He is well-developed.  HENT:     Head: Normocephalic and atraumatic.     Comments: Polyp nostril non-obstructive Cardiovascular:     Rate and Rhythm: Normal rate and regular rhythm.  Pulmonary:     Effort: Pulmonary effort is normal. No respiratory distress.     Breath sounds: Normal breath sounds. No wheezing or rales.  Abdominal:     General: Bowel sounds are normal. There is no distension.     Palpations: Abdomen is soft.     Tenderness: There is no abdominal tenderness. There is no rebound.  Musculoskeletal:     Cervical back: Normal range of motion.  Skin:    General: Skin is warm and dry.  Neurological:     Mental Status: He is alert and oriented to person, place, and time.     Coordination: Coordination normal.     Vitals:   01/10/22 0845  BP: 112/68  Pulse: 68  Temp: 98.2 F (36.8 C)  TempSrc: Oral  SpO2: 99%  Weight: 161 lb (73 kg)  Height: '5\' 9"'$  (1.753 m)    Assessment & Plan:

## 2022-01-10 NOTE — Assessment & Plan Note (Signed)
Flu shot up to date. Covid-19 counseled. Tetanus up to date. Colonoscopy up to date. Counseled about sun safety and mole surveillance. Counseled about the dangers of distracted driving. Given 10 year screening recommendations.

## 2022-01-31 IMAGING — CT CT CHEST-ABD-PELV W/ CM
2 of 4 series · 12 of 30 positions shown, 17 images · IV contrast (iopamidol)
Comparison: 11/20/2018

CLINICAL DATA: New diagnosis of rectal mass, metastatic disease
evaluation/staging

EXAM:
CT CHEST, ABDOMEN, AND PELVIS WITH CONTRAST
TECHNIQUE: Multidetector CT imaging of the chest, abdomen and pelvis was
performed following the standard protocol during bolus
administration of intravenous contrast.
CONTRAST:  100mL GDYL4T-FHH IOPAMIDOL (GDYL4T-FHH) INJECTION 61%
additional oral enteric contrast

[Series 3: cap with 5mm st · axial · 0.89mm/px · z∈[-524,-174]mm · 4 of 141 slices shown, 9 images]
[im 36/141  mediastinal]
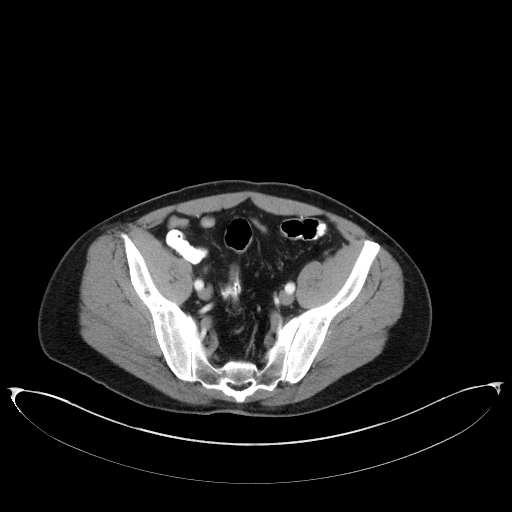
[im 36/141  lung]
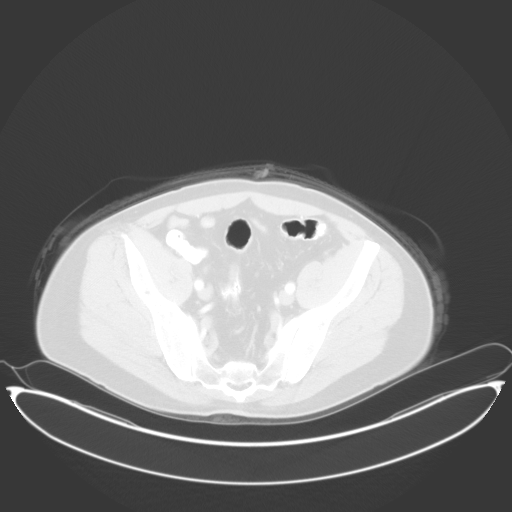
[im 36/141  bone]
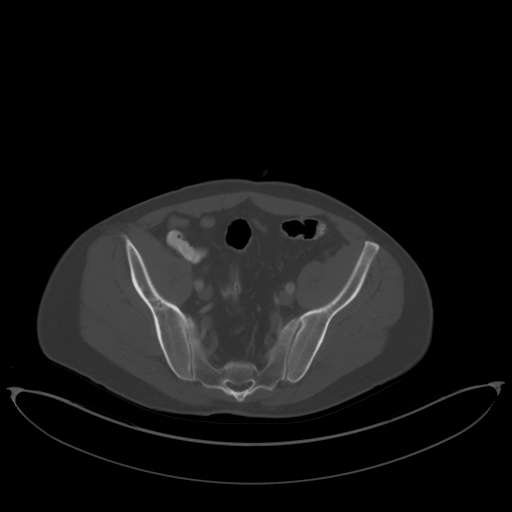
[im 70/141  mediastinal]
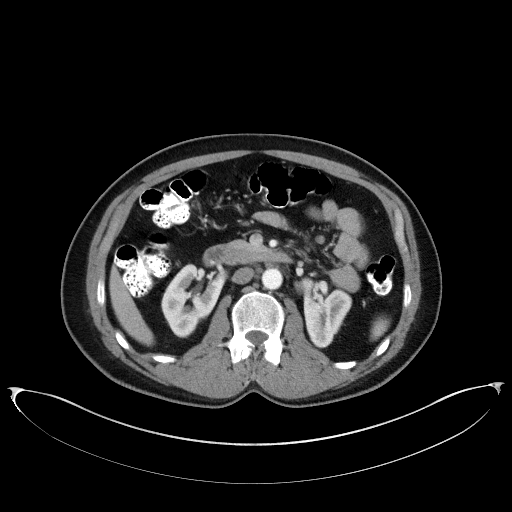
[im 70/141  lung]
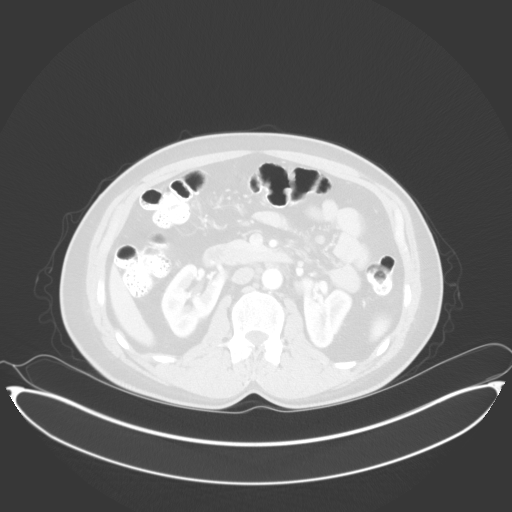
[im 71/141  mediastinal]
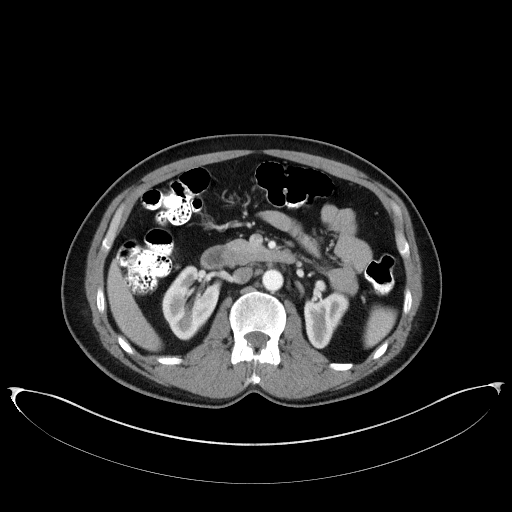
[im 71/141  lung]
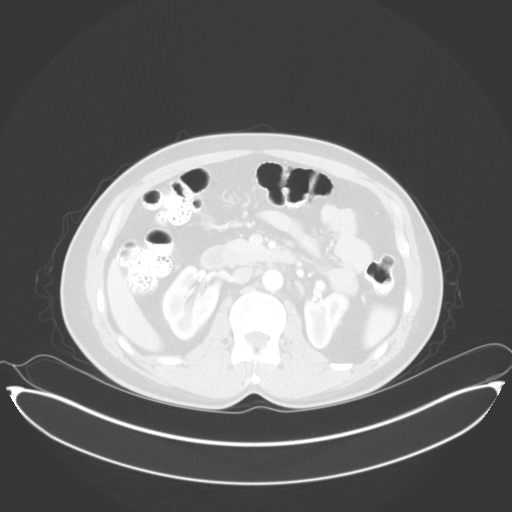
[im 106/141  mediastinal]
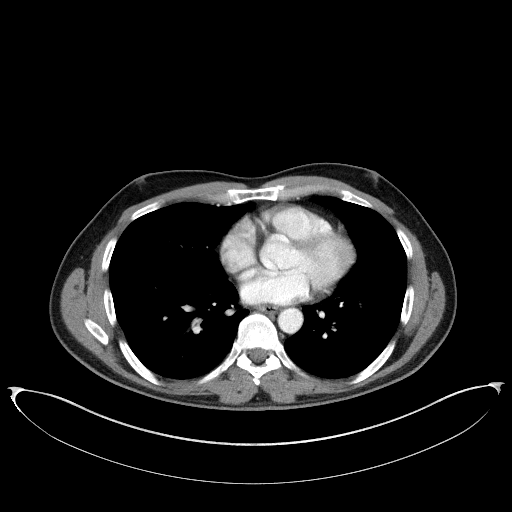
[im 106/141  lung]
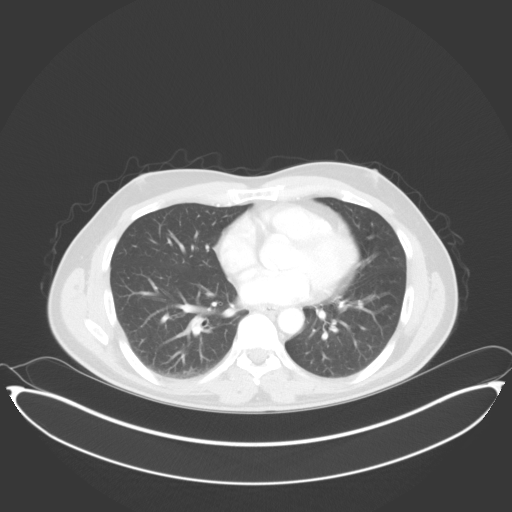

[Series 6: super d · axial · 0.89mm/px · z∈[-296,-22]mm · 8 of 401 slices shown]
[im 29/401  mediastinal]
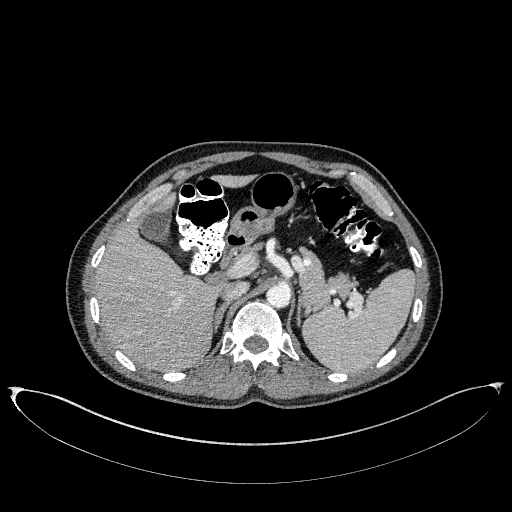
[im 86/401  mediastinal]
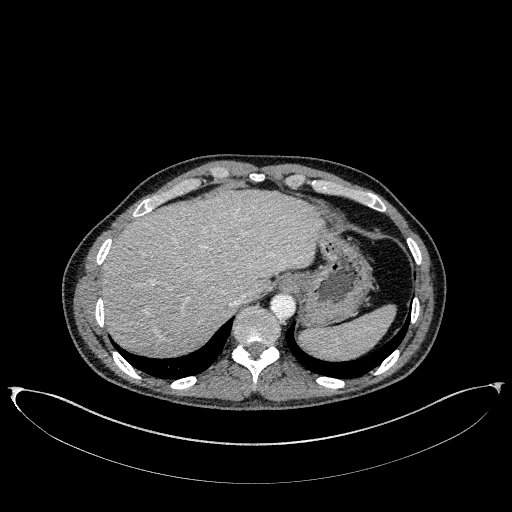
[im 143/401  mediastinal]
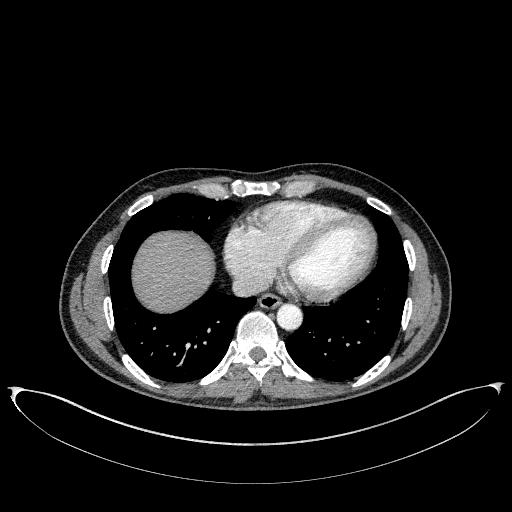
[im 172/401  mediastinal]
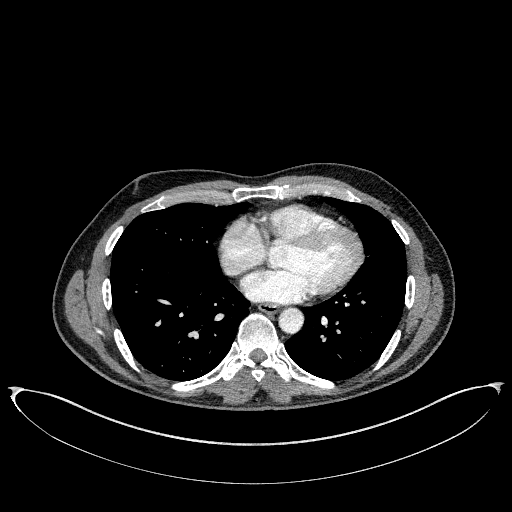
[im 229/401  mediastinal]
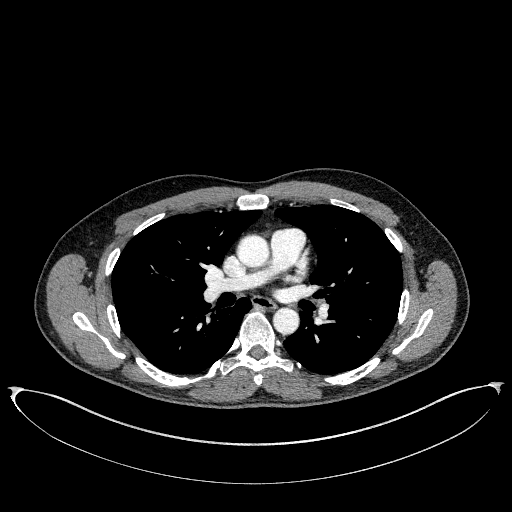
[im 258/401  mediastinal]
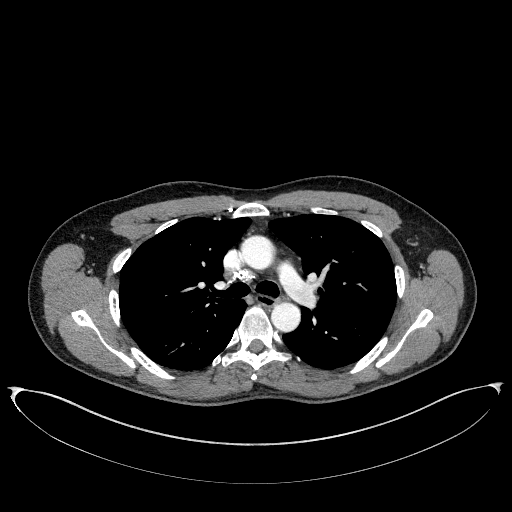
[im 315/401  mediastinal]
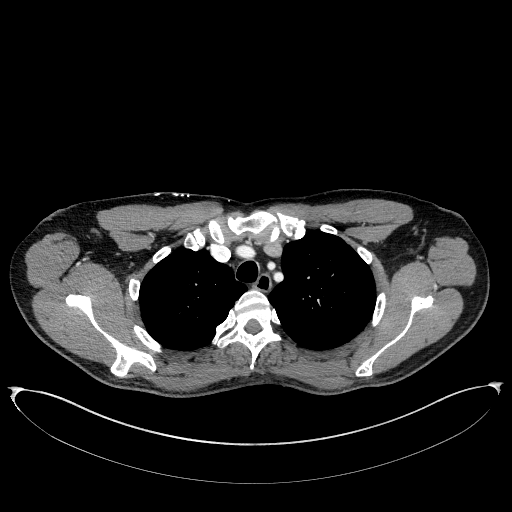
[im 372/401  mediastinal]
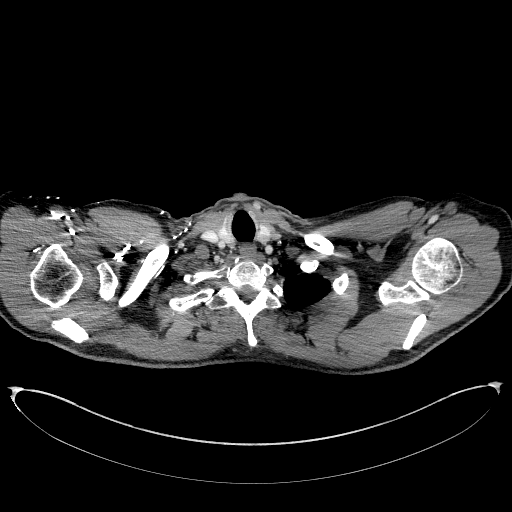

[12 of 30 positions shown; findings below may reference images not displayed]

FINDINGS: CT CHEST FINDINGS

Cardiovascular: No significant vascular findings. Normal heart size.
No pericardial effusion.

Mediastinum/Nodes: No enlarged mediastinal, hilar, or axillary lymph
nodes. Calcified mediastinal and right hilar lymph nodes. Thyroid
gland, trachea, and esophagus demonstrate no significant findings.

Lungs/Pleura: Lungs are clear. No pleural effusion or pneumothorax.

Musculoskeletal: No chest wall mass or suspicious bone lesions
identified.

CT ABDOMEN PELVIS FINDINGS

Hepatobiliary: No solid liver abnormality is seen. No gallstones,
gallbladder wall thickening, or biliary dilatation.

Pancreas: Unremarkable. No pancreatic ductal dilatation or
surrounding inflammatory changes.

Spleen: Normal in size without significant abnormality.

Adrenals/Urinary Tract: Adrenal glands are unremarkable. Horseshoe
kidney. Kidneys are otherwise normal, without renal calculi, solid
lesion, or hydronephrosis. Bladder is unremarkable.

Stomach/Bowel: Stomach is within normal limits. Appendix appears
normal. No evidence of bowel wall thickening, distention, or
inflammatory changes. Partially circumferential endoluminal mass of
the low rectum with adjacent perirectal fat stranding, measuring
approximately 3.0 x 2.9 cm (series 3, image 120).

Vascular/Lymphatic: No significant vascular findings are present. No
enlarged abdominal or pelvic lymph nodes.

Reproductive: Prostatomegaly.

Other: No abdominal wall hernia or abnormality. No abdominopelvic
ascites.

Musculoskeletal: No acute or significant osseous findings.
IMPRESSION: 1. Partially circumferential endoluminal mass of the low rectum with
adjacent perirectal fat stranding, measuring approximately 3.0 x
cm. Pelvic MRI is the test of choice for the local staging of rectal
cancer.
2. No evidence of lymphadenopathy or metastatic disease in the
chest, abdomen, or pelvis.
3. Incidental note of horseshoe kidney.
4. Prostatomegaly.

## 2022-02-02 NOTE — Progress Notes (Signed)
Edward [name] Edward [name] [place] Phone: [phone] Subjective:   Edward [name], am serving as a scribe for Dr. Hulan Saas.  I'm seeing this patient by the request  of:  Hoyt Koch, MD  CC: Shoulder pain wrist pain  QAS:[id number]  Edward [name] is a 46 y.o. male coming in with complaint of L shoulder pain. Injected AC joint in October 2022. Patient states that he has been sore for a while , R wrist is still sore     Past Medical History:  Diagnosis Date   Arthritis    OA right knee   Family history of prostate cancer    Kidney stones    has a horse shoe kidney   Thyroid disease    [name] past surgical history on file. Social History   Socioeconomic History   Marital status: Married    Spouse name: Not on file   Number of children: Not on file   Years of education: Not on file   Highest education level: Not on file  Occupational History   Not on file  Tobacco Use   Smoking status: Former   Smokeless tobacco: Never  Vaping Use   Vaping Use: Never used  Substance and Sexual Activity   Alcohol use: Yes    Comment: glass of wine at [facility]   Drug use: Never   Sexual activity: Not on file  Other Topics Concern   Not on file  Social History Narrative   Not on file   Social Determinants of Health   Financial Resource Strain: Not on file  Food Insecurity: Not on file  Transportation Needs: Not on file  Physical Activity: Not on file  Stress: Not on file  Social Connections: Not on file   [name] Known Allergies Family History  Problem Relation Age of Onset   Depression Mother    Asthma Father    Cancer Father    CAD Father    Hyperlipidemia Father    Hypertension Father    Colon cancer Neg Hx    Esophageal cancer Neg Hx    Rectal cancer Neg Hx    Stomach cancer Neg Hx     Current Outpatient Medications (Endocrine & Metabolic):    levothyroxine (SYNTHROID) 75 MCG tablet, TAKE 1 TABLET(75 MCG) BY  MOUTH DAILY BEFORE BREAKFAST    Current Outpatient Medications (Analgesics):    oxyCODONE (OXY IR/ROXICODONE) 5 MG immediate release tablet, Take by mouth.  Current Outpatient Medications (Hematological):    vitamin B-12 (CYANOCOBALAMIN) 1000 MCG tablet, Take 1,000 mcg by mouth daily.  Current Outpatient Medications (Other):    cholecalciferol (VITAMIN D3) 25 MCG (1000 UT) tablet, Take 2,000 Units by mouth 2 (two) times a day.   DULoxetine (CYMBALTA) 20 MG capsule, Take 1 capsule (20 mg total) by mouth 2 (two) times daily.   traZODone (DESYREL) 50 MG tablet, TAKE 1/2 TO 1 TABLET(25 TO 50 MG) BY MOUTH AT BEDTIME AS NEEDED FOR SLEEP   Reviewed prior external information including notes and imaging from  primary care provider As well as notes that were available from care everywhere and other healthcare systems.  Patient has been treated for rectal cancer and has already done chemotherapy.  Past medical history, social, surgical and family history all reviewed in electronic medical record.  [name] pertanent information unless stated regarding to the chief complaint.   Review of Systems:  [name] headache, visual changes, nausea, vomiting, diarrhea, constipation, dizziness, abdominal pain,  skin rash, fevers, chills, night sweats, weight loss, swollen lymph nodes, body aches, joint swelling, chest pain, shortness of breath, mood changes. POSITIVE muscle aches  Objective  Blood pressure 118/84, pulse 80, height '5\' 9"'$  (1.753 m), weight 161 lb (73 kg), SpO2 97 %.   General: [name] apparent distress alert and oriented x3 mood and affect normal, dressed appropriately.  HEENT: Pupils equal, extraocular movements intact  Respiratory: Patient's speak in full sentences and does not appear short of breath  Cardiovascular: [name] lower extremity edema, non tender, [name] erythema  Left shoulder exam does show some tenderness to palpation over the acromioclavicular joint.  Positive crossover noted. Right wrist exam has  good range of motion noted.  Good grip strength.  Osteopathic findings C2 flexed rotated and side bent left C4 flexed rotated and side bent left C6 flexed rotated and side bent left T3 extended rotated and side bent right inhaled third rib T9 extended rotated and side bent left L2 flexed rotated and side bent right Sacrum right on right   Procedure: Real-time Ultrasound Guided Injection of left acromioclavicular joint Device: GE Logiq Q7 Ultrasound guided injection is preferred based studies that show increased duration, increased effect, greater accuracy, decreased procedural pain, increased response rate, and decreased cost with ultrasound guided versus blind injection.  Verbal informed consent obtained.  Time-out conducted.  Noted [name] overlying erythema, induration, or other signs of local infection.  Skin prepped in a sterile fashion.  Local anesthesia: Topical Ethyl chloride.  With sterile technique and under real time ultrasound guidance: With a 25-gauge half inch needle injected with 0.5 cc of 0.5% Marcaine and 0.5 cc of Kenalog 40 mg/mL Completed without difficulty  Pain immediately resolved suggesting accurate placement of the medication.  Advised to call if fevers/chills, erythema, induration, drainage, or persistent bleeding.  Impression: Technically successful ultrasound guided injection.    Impression and Recommendations:    The above documentation has been reviewed and is accurate and complete Lyndal Pulley, DO

## 2022-02-10 ENCOUNTER — Ambulatory Visit (INDEPENDENT_AMBULATORY_CARE_PROVIDER_SITE_OTHER): Payer: BC Managed Care – PPO | Admitting: Family Medicine

## 2022-02-10 ENCOUNTER — Ambulatory Visit: Payer: Self-pay

## 2022-02-10 ENCOUNTER — Ambulatory Visit (INDEPENDENT_AMBULATORY_CARE_PROVIDER_SITE_OTHER): Payer: BC Managed Care – PPO

## 2022-02-10 VITALS — BP 118/84 | HR 80 | Ht 69.0 in | Wt 161.0 lb

## 2022-02-10 DIAGNOSIS — M9903 Segmental and somatic dysfunction of lumbar region: Secondary | ICD-10-CM | POA: Diagnosis not present

## 2022-02-10 DIAGNOSIS — M25512 Pain in left shoulder: Secondary | ICD-10-CM | POA: Diagnosis not present

## 2022-02-10 DIAGNOSIS — M9901 Segmental and somatic dysfunction of cervical region: Secondary | ICD-10-CM

## 2022-02-10 DIAGNOSIS — M9902 Segmental and somatic dysfunction of thoracic region: Secondary | ICD-10-CM | POA: Diagnosis not present

## 2022-02-10 DIAGNOSIS — M25531 Pain in right wrist: Secondary | ICD-10-CM

## 2022-02-10 DIAGNOSIS — M999 Biomechanical lesion, unspecified: Secondary | ICD-10-CM

## 2022-02-10 DIAGNOSIS — M778 Other enthesopathies, not elsewhere classified: Secondary | ICD-10-CM | POA: Diagnosis not present

## 2022-02-10 DIAGNOSIS — M9908 Segmental and somatic dysfunction of rib cage: Secondary | ICD-10-CM

## 2022-02-10 DIAGNOSIS — G2589 Other specified extrapyramidal and movement disorders: Secondary | ICD-10-CM

## 2022-02-10 DIAGNOSIS — M9904 Segmental and somatic dysfunction of sacral region: Secondary | ICD-10-CM

## 2022-02-10 NOTE — Assessment & Plan Note (Signed)
Continue to work on the scapular dyskinesis.  Discussed posture and ergonomics.  Discussed which activities to do and which ones to avoid.  Increase activity slowly.  Follow-up again in 6 to 8 weeks

## 2022-02-10 NOTE — Assessment & Plan Note (Signed)
   Decision today to treat with OMT was based on Physical Exam  After verbal consent patient was treated with  ME, FPR techniques in cervical, thoracic, rib, lumbar and sacral areas, all areas are chronic   Patient tolerated the procedure well with improvement in symptoms  Patient given exercises, stretches and lifestyle modifications  See medications in patient instructions if given  Patient will follow up in 4-8 weeks 

## 2022-02-10 NOTE — Assessment & Plan Note (Signed)
Chronic, with worsening symptoms.  Will get x-rays with patient having the new history of the cancer.  I believe though that this is mostly the [facility] joint and had improvement after the injection almost immediately.  Follow-up with me again in 6 to 8 weeks.

## 2022-02-10 NOTE — Patient Instructions (Addendum)
X-rays on the way out We will contact you about your results  Tart cherry 1200 mg Ice to the shoulder  keep hands in Periferal  when doing yoga Voice recognition software  4-6 week follow up

## 2022-02-10 NOTE — Assessment & Plan Note (Signed)
Very mild tendinitis but patient does have some timeliness on ultrasound that is consistent with potential uric acid disposition.  We discussed with patient about over-the-counter tart cherry extract.  May need to consider allopurinol.  Will get a uric acid at his next lab draw.

## 2022-02-13 ENCOUNTER — Encounter: Payer: Self-pay | Admitting: Family Medicine

## 2022-02-15 ENCOUNTER — Other Ambulatory Visit: Payer: Self-pay

## 2022-02-15 DIAGNOSIS — M2550 Pain in unspecified joint: Secondary | ICD-10-CM

## 2022-02-16 ENCOUNTER — Other Ambulatory Visit (INDEPENDENT_AMBULATORY_CARE_PROVIDER_SITE_OTHER): Payer: BC Managed Care – PPO

## 2022-02-16 DIAGNOSIS — M2550 Pain in unspecified joint: Secondary | ICD-10-CM

## 2022-02-16 LAB — CBC WITH DIFFERENTIAL/PLATELET
Basophils Absolute: 0 10*3/uL (ref 0.0–0.1)
Basophils Relative: 0.5 % (ref 0.0–3.0)
Eosinophils Absolute: 0.1 10*3/uL (ref 0.0–0.7)
Eosinophils Relative: 1.5 % (ref 0.0–5.0)
HCT: 43.3 % (ref 39.0–52.0)
Hemoglobin: 14.7 g/dL (ref 13.0–17.0)
Lymphocytes Relative: 14.9 % (ref 12.0–46.0)
Lymphs Abs: 0.7 10*3/uL (ref 0.7–4.0)
MCHC: 33.8 g/dL (ref 30.0–36.0)
MCV: 90.2 fl (ref 78.0–100.0)
Monocytes Absolute: 0.4 10*3/uL (ref 0.1–1.0)
Monocytes Relative: 7.8 % (ref 3.0–12.0)
Neutro Abs: 3.6 10*3/uL (ref 1.4–7.7)
Neutrophils Relative %: 75.3 % (ref 43.0–77.0)
Platelets: 150 10*3/uL (ref 150.0–400.0)
RBC: 4.8 Mil/uL (ref 4.22–5.81)
RDW: 13.8 % (ref 11.5–15.5)
WBC: 4.8 10*3/uL (ref 4.0–10.5)

## 2022-02-16 LAB — SEDIMENTATION RATE: Sed Rate: 1 mm/hr (ref 0–15)

## 2022-02-16 LAB — URIC ACID: Uric Acid, Serum: 4.5 mg/dL (ref 4.0–7.8)

## 2022-02-19 LAB — PROTEIN ELECTROPHORESIS, SERUM
Albumin ELP: 4.4 g/dL (ref 3.8–4.8)
Alpha 1: 0.2 g/dL (ref 0.2–0.3)
Alpha 2: 0.5 g/dL (ref 0.5–0.9)
Beta 2: 0.3 g/dL (ref 0.2–0.5)
Beta Globulin: 0.4 g/dL (ref 0.4–0.6)
Gamma Globulin: 0.7 g/dL — ABNORMAL LOW (ref 0.8–1.7)
Total Protein: 6.6 g/dL (ref 6.1–8.1)

## 2022-02-19 LAB — CEA: CEA: <2 ng/mL

## 2022-03-09 NOTE — Progress Notes (Signed)
[place] Phone: [phone] Subjective:   Edward [name], am serving as a scribe for Dr. Hulan Saas.  I'm seeing this patient by the request  of:  Hoyt Koch, MD  CC: Back and neck pain follow-up  [id number]  Edward [name] is a 47 y.o. male coming in with complaint of back and neck pain. OMT 02/10/2022. L AC jt and R wrist pain. Patient states  Patient states that his wrist is still sore. Has bene taping wrist more often. Painful when [name] taping.  Had dry needling for his back and L shoulder on Tuesday which was helpful. AC jt pain has subsided but still having L scapular pain.   Medications patient has been prescribed: None  Taking:         Reviewed prior external information including notes and imaging from previsou exam, outside providers and external EMR if available.   As well as notes that were available from care everywhere and other healthcare systems.  Past medical history, social, surgical and family history all reviewed in electronic medical record.  [name] pertanent information unless stated regarding to the chief complaint.   Past Medical History:  Diagnosis Date   Arthritis    OA right knee   Family history of prostate cancer    Kidney stones    has a horse shoe kidney   Thyroid disease     [name] Known Allergies   Review of Systems:  [name] headache, visual changes, nausea, vomiting, diarrhea, constipation, dizziness, abdominal pain, skin rash, fevers, chills, night sweats, weight loss, swollen lymph nodes, body aches, joint swelling, chest pain, shortness of breath, mood changes. POSITIVE muscle aches  Objective  Blood pressure 110/78, pulse 69, height '5\' 9"'$  (1.753 m), weight 160 lb (72.6 kg), SpO2 96 %.   General: [name] apparent distress alert and oriented x3 mood and affect normal, dressed appropriately.  HEENT: Pupils equal, extraocular movements intact  Respiratory:  Patient's speak in full sentences and does not appear short of breath  Cardiovascular: [name] lower extremity edema, non tender, [name] erythema  Back exam does have some loss of lordosis.  Multiple trigger points noted on the left side of the paraspinal musculature.  Patient does have positive impingement noted.  This is of the shoulder.  Osteopathic findings  C2 flexed rotated and side bent right C7 flexed rotated and side bent left T3 extended rotated and side bent left inhaled rib T8 extended rotated and side bent left L2 flexed rotated and side bent right Sacrum right on right  Limited muscular skeletal ultrasound was performed and interpreted by Hulan Saas, M  Limited ultrasound of patient's right wrist does show some hypoechoic changes in the third compartment around the tendon sheath.  [name] increase in Doppler flow.  There is some mild interval improvement.  Still some calcific changes or depositions of the wrist noted. Impression: Interval improvement but still some chronic changes.  After verbal consent patient was prepped with alcohol swab and with a 25-gauge half inch needle injected into 4 distinct trigger points in the latissimus dorsi, trapezius and rhomboid muscle of the left shoulder girdle with a total of 3 cc of 0.5% Marcaine and 1 cc of Kenalog 40 mg per mL, minimal blood loss, postinjection instructions given after Band-Aid placed     Assessment and Plan:  Scapular dyskinesis Scapular dyskinesis noted.  Responded relatively well to osteopathic manipulation but did have some underlying trigger points as  well in the latissimus dorsi, rhomboid and trapezius muscles.  Patient given injection today and tolerated the procedure well.  Patient will be having surgical intervention in the near future when reviewing other labs so this will keep him from seeing [place] for 6 weeks total after surgery.  At that point would like to see him again and reevaluate how patient is  responding.  Trigger point of left shoulder region Repeat injection given today and tolerated the procedure well, discussed icing regimen and home exercises, which activities to do and which ones to avoid, increase activity slowly.  Discussed with patient that if there is worsening symptoms that we may need to consider further evaluation of the neck but I highly anticipate patient doing well.  Right wrist tendinitis Mild improvement noted with the hypoechoic changes on the ultrasound today. Discussed with patient about icing regimen and home exercises, which activities to do and which ones to avoid, increase activity slowly.  Continue with the taping.  Patient will be out of work for 2 weeks and we will see how patient responds.  Follow-up again in 6 to 8 weeks.    Nonallopathic problems  Decision today to treat with OMT was based on Physical Exam  After verbal consent patient was treated with HVLA, ME, FPR techniques in cervical, rib, thoracic, lumbar, and sacral  areas  Patient tolerated the procedure well with improvement in symptoms  Patient given exercises, stretches and lifestyle modifications  See medications in patient instructions if given  Patient will follow up in 4-8 weeks             Note: This dictation was prepared with Dragon dictation along with smaller phrase technology. Any transcriptional errors that result from this process are unintentional.

## 2022-03-11 ENCOUNTER — Ambulatory Visit: Payer: BC Managed Care – PPO | Admitting: Family Medicine

## 2022-03-11 ENCOUNTER — Ambulatory Visit: Payer: Self-pay

## 2022-03-11 VITALS — BP 110/78 | HR 69 | Ht 69.0 in | Wt 160.0 lb

## 2022-03-11 DIAGNOSIS — G2589 Other specified extrapyramidal and movement disorders: Secondary | ICD-10-CM | POA: Diagnosis not present

## 2022-03-11 DIAGNOSIS — M25512 Pain in left shoulder: Secondary | ICD-10-CM

## 2022-03-11 DIAGNOSIS — M9902 Segmental and somatic dysfunction of thoracic region: Secondary | ICD-10-CM

## 2022-03-11 DIAGNOSIS — M9903 Segmental and somatic dysfunction of lumbar region: Secondary | ICD-10-CM | POA: Diagnosis not present

## 2022-03-11 DIAGNOSIS — M9908 Segmental and somatic dysfunction of rib cage: Secondary | ICD-10-CM

## 2022-03-11 DIAGNOSIS — M9901 Segmental and somatic dysfunction of cervical region: Secondary | ICD-10-CM | POA: Diagnosis not present

## 2022-03-11 DIAGNOSIS — M9904 Segmental and somatic dysfunction of sacral region: Secondary | ICD-10-CM | POA: Diagnosis not present

## 2022-03-11 DIAGNOSIS — G8929 Other chronic pain: Secondary | ICD-10-CM | POA: Diagnosis not present

## 2022-03-11 DIAGNOSIS — M778 Other enthesopathies, not elsewhere classified: Secondary | ICD-10-CM | POA: Diagnosis not present

## 2022-03-11 NOTE — Assessment & Plan Note (Signed)
Repeat injection given today and tolerated the procedure well, discussed icing regimen and home exercises, which activities to do and which ones to avoid, increase activity slowly.  Discussed with patient that if there is worsening symptoms that we may need to consider further evaluation of the neck but I highly anticipate patient doing well.

## 2022-03-11 NOTE — Assessment & Plan Note (Signed)
Scapular dyskinesis noted.  Responded relatively well to osteopathic manipulation but did have some underlying trigger points as well in the latissimus dorsi, rhomboid and trapezius muscles.  Patient given injection today and tolerated the procedure well.  Patient will be having surgical intervention in the near future when reviewing other labs so this will keep him from seeing [place] for 6 weeks total after surgery.  At that point would like to see him again and reevaluate how patient is responding.

## 2022-03-11 NOTE — Assessment & Plan Note (Signed)
Mild improvement noted with the hypoechoic changes on the ultrasound today. Discussed with patient about icing regimen and home exercises, which activities to do and which ones to avoid, increase activity slowly.  Continue with the taping.  Patient will be out of work for 2 weeks and we will see how patient responds.  Follow-up again in 6 to 8 weeks.

## 2022-03-11 NOTE — Patient Instructions (Addendum)
Trigger point injections today Tart cherry '1200mg'$  at night You should do great with surgery  Yoga wheel or block between shoulder blades See me again in 6 weeks after surgery

## 2022-03-18 ENCOUNTER — Other Ambulatory Visit: Payer: Self-pay | Admitting: Internal Medicine

## 2022-03-21 ENCOUNTER — Telehealth: Payer: Self-pay

## 2022-03-21 NOTE — Telephone Encounter (Signed)
Transition Care Management Unsuccessful Follow-up Telephone Call  Date of discharge and from where:  TCM DC [facility] 03-18-22 Dx: Ileostomy   Attempts:  1st Attempt  Reason for unsuccessful TCM follow-up call:  Left voice message   Juanda Crumble LPN [place] Direct Dial [phone]

## 2022-03-22 NOTE — Telephone Encounter (Signed)
Transition Care Management Unsuccessful Follow-up Telephone Call  Date of discharge and from where:  TCM DC [facility] 03-18-22 Dx: Ileostomy   Attempts:  2nd Attempt  Reason for unsuccessful TCM follow-up call:  Left voice message   Juanda Crumble LPN [place] Direct Dial [phone]

## 2022-03-28 ENCOUNTER — Encounter: Payer: Self-pay | Admitting: Internal Medicine

## 2022-03-28 ENCOUNTER — Ambulatory Visit: Payer: BC Managed Care – PPO | Admitting: Internal Medicine

## 2022-03-28 VITALS — BP 112/80 | HR 89 | Temp 98.6°F | Ht 69.0 in | Wt 161.0 lb

## 2022-03-28 DIAGNOSIS — F4323 Adjustment disorder with mixed anxiety and depressed mood: Secondary | ICD-10-CM

## 2022-03-28 DIAGNOSIS — C20 Malignant neoplasm of rectum: Secondary | ICD-10-CM

## 2022-03-28 MED ORDER — HYDROCORTISONE (PERIANAL) 2.5 % EX CREA: 1.0000 | TOPICAL_CREAM | Freq: Two times a day (BID) | CUTANEOUS | 0 refills | Status: DC

## 2022-03-28 NOTE — Progress Notes (Signed)
   Subjective:   Patient ID: Edward [name], male    DOB: [birth year], 47 y.o.   MRN: [id number]  HPI The patient is a 47 YO man coming in for follow up recent surgery for reconnection of ileostomy   Review of Systems  Constitutional:  Positive for activity change and fatigue.  HENT: Negative.    Eyes: Negative.   Respiratory:  Negative for cough, chest tightness and shortness of breath.   Cardiovascular:  Negative for chest pain, palpitations and leg swelling.  Gastrointestinal:  Positive for abdominal distention, abdominal pain and diarrhea. Negative for constipation, nausea and vomiting.  Musculoskeletal: Negative.   Skin: Negative.   Neurological: Negative.   Psychiatric/Behavioral: Negative.      Objective:  Physical Exam Constitutional:      Appearance: He is well-developed.  HENT:     Head: Normocephalic and atraumatic.  Cardiovascular:     Rate and Rhythm: Normal rate and regular rhythm.  Pulmonary:     Effort: Pulmonary effort is normal. No respiratory distress.     Breath sounds: Normal breath sounds. No wheezing or rales.  Abdominal:     General: Bowel sounds are normal. There is distension.     Palpations: Abdomen is soft.     Tenderness: There is abdominal tenderness. There is no rebound.     Comments: Wound examined and about 3 cm circular wound about 4 cm deep without redness or purulent discharge.   Musculoskeletal:     Cervical back: Normal range of motion.  Skin:    General: Skin is warm and dry.  Neurological:     Mental Status: He is alert and oriented to person, place, and time.     Coordination: Coordination normal.     Vitals:   03/28/22 0952  BP: 112/80  Pulse: 89  Temp: 98.6 F (37 C)  TempSrc: Oral  SpO2: 97%  Weight: 161 lb (73 kg)  Height: '5\' 9"'$  (1.753 m)    Assessment & Plan:  Visit time 20 minutes in face to face communication with patient and coordination of care, additional 10 minutes spent in record review, coordination or care,  ordering tests, communicating/referring to other healthcare professionals, documenting in medical records all on the same day of the visit for total time 30 minutes spent on the visit.

## 2022-03-28 NOTE — Assessment & Plan Note (Signed)
Is overall controlled with cymbalta 20 mg BID and we did talk about wean to 20 mg daily once he feels he wants to get off for 2-4 weeks then either stop or 20 mg every other day for 2 weeks then stop.

## 2022-03-28 NOTE — Assessment & Plan Note (Signed)
Doing well post-op and still with frequency of bowels and using imodium. He has a skin tag of the anus which is flared up and rx anusol ointment 2.5 % to use TID to help. We talked about likely duration until bowels are more adjusted.

## 2022-03-28 NOTE — Patient Instructions (Signed)
We have sent in the ointment to use twice a day

## 2022-04-22 NOTE — Progress Notes (Unsigned)
[place] Phone: [phone] Subjective:   Fontaine No, am serving as a scribe for Dr. Hulan Saas.  I'm seeing this patient by the request  of:  Hoyt Koch, MD  CC: Back and neck pain, wrist pain  [id number]  Sunil Zepp is a 47 y.o. male coming in with complaint of back and neck pain. OMT 03/11/2022. Patient states that he continues to have R wrist pain. Pain in wrist seems to be moving to different areas. Taping wrist is helpful. Two weeks off did not help his pain.   Back and neck pain has been manageable. Able to sleep on L side again.   Medications patient has been prescribed: None  Taking:         Reviewed prior external information including notes and imaging from previsou exam, outside providers and external EMR if available.   As well as notes that were available from care everywhere and other healthcare systems.  Past medical history, social, surgical and family history all reviewed in electronic medical record.  No pertanent information unless stated regarding to the chief complaint.   Past Medical History:  Diagnosis Date   Arthritis    OA right knee   Family history of prostate cancer    Kidney stones    has a horse shoe kidney   Thyroid disease     No Known Allergies   Review of Systems:  No headache, visual changes, nausea, vomiting, diarrhea, constipation, dizziness, abdominal pain, skin rash, fevers, chills, night sweats, weight loss, swollen lymph nodes, body aches, joint swelling, chest pain, shortness of breath, mood changes. POSITIVE muscle aches  Objective  Blood pressure 118/82, pulse 77, height '5\' 9"'$  (1.753 m), weight 163 lb (73.9 kg), SpO2 96 %.   General: No apparent distress alert and oriented x3 mood and affect normal, dressed appropriately.  HEENT: Pupils equal, extraocular movements intact  Respiratory: Patient's speak in full sentences and  does not appear short of breath  Cardiovascular: No lower extremity edema, non tender, no erythema  Wrist exam shows the patient does have a positive Tinel's noted.  Patient does have good grip strength noted.  Osteopathic findings  C5 flexed rotated and side bent right T3 extended rotated and side bent right inhaled rib T11 extended rotated and side bent left L1 flexed rotated and side bent right Sacrum right on right  Procedure: Real-time Ultrasound Guided Injection of right carpal tunnel Device: GE Logiq Q7  Ultrasound guided injection is preferred based studies that show increased duration, increased effect, greater accuracy, decreased procedural pain, increased response rate with ultrasound guided versus blind injection.  Verbal informed consent obtained.  Time-out conducted.  Noted no overlying erythema, induration, or other signs of local infection.  Skin prepped in a sterile fashion.  Local anesthesia: Topical Ethyl chloride.  With sterile technique and under real time ultrasound guidance:  median nerve visualized.  23g 5/8 inch needle inserted distal to proximal approach into nerve sheath. Pictures taken nfor needle placement. Patient did have injection of1 cc of 0.5% Marcaine, and 1 cc of Kenalog 40 mg/dL. Completed without difficulty  Pain immediately resolved suggesting accurate placement of the medication.  Advised to call if fevers/chills, erythema, induration, drainage, or persistent bleeding.  Impression: Technically successful ultrasound guided injection.     Assessment and Plan:  Trigger point of left shoulder region Still have some remaining, will continue to monitor, continue to give difficulty with patient's  past medical history do we consider the possibility of further imaging.  Patient will continue to increase activity slowly otherwise.  Follow-up again in 6 to 8 weeks  Right carpal tunnel syndrome Injection given today, patient has had a very nonspecific  discomfort noted of the wrist for some time this seems to be secondary to some tendinitis but now that it was more on the dorsal aspect of more pain over the median nerve did try injection today.  Hopefully this will make some improvement in this individual.  Discussed icing regimen and home exercises otherwise.  Follow-up again in 6 to 8 weeks discussed bracing at night.    Nonallopathic problems  Decision today to treat with OMT was based on Physical Exam  After verbal consent patient was treated with HVLA, ME, FPR techniques in cervical, rib, thoracic, lumbar, and sacral  areas  Patient tolerated the procedure well with improvement in symptoms  Patient given exercises, stretches and lifestyle modifications  See medications in patient instructions if given  Patient will follow up in 4-8 weeks     The above documentation has been reviewed and is accurate and complete Lyndal Pulley, DO         Note: This dictation was prepared with Dragon dictation along with smaller phrase technology. Any transcriptional errors that result from this process are unintentional.

## 2022-04-25 ENCOUNTER — Ambulatory Visit: Payer: Self-pay

## 2022-04-25 ENCOUNTER — Ambulatory Visit (INDEPENDENT_AMBULATORY_CARE_PROVIDER_SITE_OTHER): Payer: BC Managed Care – PPO | Admitting: Family Medicine

## 2022-04-25 VITALS — BP 118/82 | HR 77 | Ht 69.0 in | Wt 163.0 lb

## 2022-04-25 DIAGNOSIS — M9901 Segmental and somatic dysfunction of cervical region: Secondary | ICD-10-CM | POA: Diagnosis not present

## 2022-04-25 DIAGNOSIS — M9908 Segmental and somatic dysfunction of rib cage: Secondary | ICD-10-CM | POA: Diagnosis not present

## 2022-04-25 DIAGNOSIS — M25531 Pain in right wrist: Secondary | ICD-10-CM | POA: Diagnosis not present

## 2022-04-25 DIAGNOSIS — M9902 Segmental and somatic dysfunction of thoracic region: Secondary | ICD-10-CM | POA: Diagnosis not present

## 2022-04-25 DIAGNOSIS — G5601 Carpal tunnel syndrome, right upper limb: Secondary | ICD-10-CM | POA: Diagnosis not present

## 2022-04-25 DIAGNOSIS — M9904 Segmental and somatic dysfunction of sacral region: Secondary | ICD-10-CM

## 2022-04-25 DIAGNOSIS — M25512 Pain in left shoulder: Secondary | ICD-10-CM | POA: Diagnosis not present

## 2022-04-25 DIAGNOSIS — M9903 Segmental and somatic dysfunction of lumbar region: Secondary | ICD-10-CM | POA: Diagnosis not present

## 2022-04-25 NOTE — Assessment & Plan Note (Signed)
Still have some remaining, will continue to monitor, continue to give difficulty with patient's past medical history do we consider the possibility of further imaging.  Patient will continue to increase activity slowly otherwise.  Follow-up again in 6 to 8 weeks

## 2022-04-25 NOTE — Assessment & Plan Note (Signed)
Injection given today, patient has had a very nonspecific discomfort noted of the wrist for some time this seems to be secondary to some tendinitis but now that it was more on the dorsal aspect of more pain over the median nerve did try injection today.  Hopefully this will make some improvement in this individual.  Discussed icing regimen and home exercises otherwise.  Follow-up again in 6 to 8 weeks discussed bracing at night.

## 2022-04-25 NOTE — Patient Instructions (Addendum)
Injected wrist today 6-8 weeks

## 2022-05-09 ENCOUNTER — Other Ambulatory Visit: Payer: Self-pay | Admitting: Internal Medicine

## 2022-05-09 ENCOUNTER — Encounter: Payer: Self-pay | Admitting: Internal Medicine

## 2022-05-24 ENCOUNTER — Encounter: Payer: Self-pay | Admitting: Family Medicine

## 2022-05-24 NOTE — Telephone Encounter (Signed)
Appointment scheduled.

## 2022-05-25 ENCOUNTER — Ambulatory Visit (INDEPENDENT_AMBULATORY_CARE_PROVIDER_SITE_OTHER): Payer: BC Managed Care – PPO | Admitting: Sports Medicine

## 2022-05-25 VITALS — BP 120/78 | HR 80 | Ht 69.0 in | Wt 163.0 lb

## 2022-05-25 DIAGNOSIS — M545 Low back pain, unspecified: Secondary | ICD-10-CM | POA: Diagnosis not present

## 2022-05-25 MED ORDER — TIZANIDINE HCL 4 MG PO TABS: 4.0000 mg | ORAL_TABLET | Freq: Every day | ORAL | 0 refills | Status: DC

## 2022-05-25 MED ORDER — MELOXICAM 15 MG PO TABS: 15.0000 mg | ORAL_TABLET | Freq: Every day | ORAL | 0 refills | Status: DC

## 2022-05-25 NOTE — Patient Instructions (Addendum)
Good to see you - Start meloxicam 15 mg daily x2 weeks.  If still having pain after 2 weeks, complete 3rd-week of meloxicam. May use remaining meloxicam as needed once daily for pain control.  Do not to use additional NSAIDs while taking meloxicam.  May use Tylenol [phone] mg 2 to 3 times a day for breakthrough pain. Tizanidine 4-8 mg nightly as needed for muscle spasms Discontinue robaxin Low back HEP  3 week follow up

## 2022-05-25 NOTE — Progress Notes (Signed)
[name] [address] [place] [address] Phone: [phone]   Assessment and Plan:     1. Acute bilateral low back pain without sciatica  -Acute, initial sports medicine visit - Most consistent with muscular strain and lumbar paraspinals that was further aggravated by prolonged sitting from drive to [place] as well as walking on the beach - No red flag symptoms on physical exam, so no imaging at today's visit - No change to bowel habits with loop ileostomy takedown performed on 03/17/2022 at [facility] And physical exam most consistent with muscular dysfunction - Start HEP for low back - Start meloxicam 15 mg daily x2 weeks.  If still having pain after 2 weeks, complete 3rd-week of meloxicam. May use remaining meloxicam as needed once daily for pain control.  Do not to use additional NSAIDs while taking meloxicam.  May use Tylenol [phone] mg 2 to 3 times a day for breakthrough pain. - Start tizanidine 4 to 8 mg nightly as needed for muscle spasms.  Discontinue methocarbamol  Pertinent previous records reviewed include colorectal surgery encounter note 04/14/2022, colorectal surgery note 03/17/2022, CT chest abdomen pelvis to look at lumbar spine from 12/16/2020   Follow Up: 3 weeks for reevaluation.  Could consider physical therapy versus x-ray imaging versus OMT based on presentation   Subjective:   I, Edward [name], am serving as a Neurosurgeon for Doctor Edward [name]  Chief Complaint: low back pain   HPI:   05/25/22 Patient is a 47 year old male complaining of low back pain. Patient states lower back pain for over a week that feels suspiciously similar to the pain I experienced when I had an abscess post surgery back in September. Pain meds aren't helping, my mobility/flexibility is limited, and the pain fluctuates between general soreness to sharp acute stabs. I don't remember doing anything physical that would have caused  this. I wrote to my care team at [facility], but they don't think it's related to my treatment / take down surgery and want me to see you first. Been going on for 2 weeks, mornings he is the stiffest, he is using Cymbalta for neuropathy, tylenol does not help   Relevant Historical Information: Loop ileostomy takedown performed on 03/17/2022 with history of rectal cancer  Additional pertinent review of systems negative.   Current Outpatient Medications:    cholecalciferol (VITAMIN D3) 25 MCG (1000 UT) tablet, Take 2,000 Units by mouth 2 (two) times a day., Disp: , Rfl:    DULoxetine (CYMBALTA) 20 MG capsule, TAKE 1 CAPSULE(20 MG) BY MOUTH TWICE DAILY, Disp: 180 capsule, Rfl: 3   hydrocortisone (ANUSOL-HC) 2.5 % rectal cream, Place 1 Application rectally 2 (two) times daily., Disp: 30 g, Rfl: 0   levothyroxine (SYNTHROID) 75 MCG tablet, TAKE 1 TABLET(75 MCG) BY MOUTH DAILY BEFORE BREAKFAST, Disp: 90 tablet, Rfl: 3   meloxicam (MOBIC) 15 MG tablet, Take 1 tablet (15 mg total) by mouth daily., Disp: 30 tablet, Rfl: 0   tiZANidine (ZANAFLEX) 4 MG tablet, Take 1 tablet (4 mg total) by mouth at bedtime., Disp: 30 tablet, Rfl: 0   traZODone (DESYREL) 50 MG tablet, TAKE 1/2 TO 1 TABLET(25 TO 50 MG) BY MOUTH AT BEDTIME AS NEEDED FOR SLEEP, Disp: 90 tablet, Rfl: 2   vitamin B-12 (CYANOCOBALAMIN) 1000 MCG tablet, Take 1,000 mcg by mouth daily., Disp: , Rfl:    Objective:     Vitals:   05/25/22 0906  BP: 120/78  Pulse: 80  SpO2: 98%  Weight: 163 lb (73.9 kg)  Height: 5\' 9"  (1.753 m)      Body mass index is 24.07 kg/m.    Physical Exam:    Gen: Appears well, nad, nontoxic and pleasant Psych: Alert and oriented, appropriate mood and affect Neuro: sensation intact, strength is 5/5 in upper and lower extremities, muscle tone wnl Skin: no susupicious lesions or rashes  Back - Normal skin without mass or erythema, Spine with normal alignment and no deformity.   No tenderness to vertebral process  palpation.   Paraspinous muscles are not tender and without spasm NTTP gluteal musculature Straight leg raise negative for radicular symptoms, though reproduced left low back pain with left leg raise Trendelenberg positive left Piriformis Test negative    Electronically signed by:  [name] 9:31 AM 05/25/22

## 2022-05-30 ENCOUNTER — Encounter: Payer: Self-pay | Admitting: Internal Medicine

## 2022-05-30 DIAGNOSIS — R1032 Left lower quadrant pain: Secondary | ICD-10-CM

## 2022-06-07 NOTE — Progress Notes (Unsigned)
[name] [address] [place] [address] Phone: [phone] Subjective:   [name], am serving as a scribe for Dr. Antoine Primas.  I'm seeing this patient by the request  of:  Myrlene Broker, MD  CC: Back and neck pain  [id number]  Kamerin Grumbine is a 47 y.o. male coming in with complaint of back and neck pain. OMT 04/25/2022. Also f/u for R wrist pain. Patient states wrist still hurts. Injection didn't help. LBP is sthe same or has been worse since Easter. Still having problems in scapula area.   Medications patient has been prescribed: None  Taking:         Reviewed prior external information including notes and imaging from previsou exam, outside providers and external EMR if available.   As well as notes that were available from care everywhere and other healthcare systems.  Past medical history, social, surgical and family history all reviewed in electronic medical record.  No pertanent information unless stated regarding to the chief complaint.   Past Medical History:  Diagnosis Date   Arthritis    OA right knee   Family history of prostate cancer    Kidney stones    has a horse shoe kidney   Thyroid disease     No Known Allergies   Review of Systems:  No headache, visual changes, nausea, vomiting, diarrhea, constipation, dizziness, abdominal pain, skin rash, fevers, chills, night sweats, weight loss, swollen lymph nodes, body aches, joint swelling, chest pain, shortness of breath, mood changes. POSITIVE muscle aches  Objective  Blood pressure (!) 122/90, pulse 67, height  (1.753 m), weight 165 lb (74.8 kg), SpO2 98 %.   General: No apparent distress alert and oriented x3 mood and affect normal, dressed appropriately.  HEENT: Pupils equal, extraocular movements intact  Respiratory: Patient's speak in full sentences and does not appear short of breath  Cardiovascular: No lower extremity edema, non  tender, no erythema  , But was able to subsequently osteoarthrosis.  Nontender to palpation midline.  Patient has pain over the sacroiliac joints as well as over the spinous processes of L4 and L5.  Patient does have some weakness of the lower extremity.  Patient does have radicular symptoms in the sciatic nerve distribution with straight leg test bilaterally    Trigger point still noted in the left parascapular area.  3 distinct ones in the left scapula, rhomboid and trapezius.  Right wrist exam does have some tenderness still noted over the dorsal aspect of the wrist with worsening pain with resisted extension.  After verbal consent patient was prepped with alcohol swab and with a 25-gauge half inch needle injected into 3 distinct trigger points in the lever scapula, trapezius and rhomboid musculature.  Total of 3 cc of 0.5% Marcaine and 1 cc of Kenalog 40 mg/mL used.  No blood loss.  Band-Aids placed.  Postinjection instructions given   Procedure: Real-time Ultrasound Guided Injection of right dorsal third compartment sheath of the wrist Device: GE Logiq Q7 Ultrasound guided injection is preferred based studies that show increased duration, increased effect, greater accuracy, decreased procedural pain, increased response rate, and decreased cost with ultrasound guided versus blind injection.  Verbal informed consent obtained.  Time-out conducted.  Noted no overlying erythema, induration, or other signs of local infection.  Skin prepped in a sterile fashion.  Local anesthesia: Topical Ethyl chloride.  With sterile technique and under real time ultrasound guidance: With a 25-gauge half inch  needle injected with 0.5 cc of 0.5% Marcaine and 0.5 cc of Kenalog 40 mg/mL Completed without difficulty  Pain immediately resolved suggesting accurate placement of the medication.  Advised to call if fevers/chills, erythema, induration, drainage, or persistent bleeding.  Impression: Technically  successful ultrasound guided injection.    Assessment and Plan:  Right wrist tendinitis Injection given today but if continuing to have difficulty I do think that another conduction study is necessary.  This does have some hypoechoic changes in the tendon sheath and hopefully will make some improvement but not completely optimistic with some of the neurologic symptoms patient is having.  Trigger point of left shoulder region Repeat injections given again today.  He is concerned that there is some more cervical radiculopathy and may need to consider the possibility of advanced imaging. He is currently in posttreatment as well.  Discussed icing regimen and home exercises compared to when patient is having more with his back pain I do feel that that is more warranted for further evaluation.  Midline back pain Patient is having some radicular symptoms noted today with straight leg test.  Patient is having midline pain that is concerning as well.  Past medical history significant for rectal cancer.  X-rays and MRI ordered today to further evaluate.  Patient is going to be scheduled for a CT scan of the abdomen and pelvis as well for further evaluation.  Depending on all the findings we will see if anything is necessary and how this change medical management.       The above documentation has been reviewed and is accurate and complete Judi Saa, DO         Note: This dictation was prepared with Dragon dictation along with smaller phrase technology. Any transcriptional errors that result from this process are unintentional.

## 2022-06-08 ENCOUNTER — Ambulatory Visit (INDEPENDENT_AMBULATORY_CARE_PROVIDER_SITE_OTHER): Payer: BC Managed Care – PPO | Admitting: Family Medicine

## 2022-06-08 ENCOUNTER — Other Ambulatory Visit: Payer: Self-pay

## 2022-06-08 ENCOUNTER — Telehealth: Payer: Self-pay

## 2022-06-08 ENCOUNTER — Other Ambulatory Visit: Payer: BC Managed Care – PPO

## 2022-06-08 VITALS — BP 122/90 | HR 67 | Ht 69.0 in | Wt 165.0 lb

## 2022-06-08 DIAGNOSIS — M25512 Pain in left shoulder: Secondary | ICD-10-CM

## 2022-06-08 DIAGNOSIS — M25531 Pain in right wrist: Secondary | ICD-10-CM | POA: Diagnosis not present

## 2022-06-08 DIAGNOSIS — M5441 Lumbago with sciatica, right side: Secondary | ICD-10-CM

## 2022-06-08 DIAGNOSIS — M778 Other enthesopathies, not elsewhere classified: Secondary | ICD-10-CM

## 2022-06-08 DIAGNOSIS — M5489 Other dorsalgia: Secondary | ICD-10-CM | POA: Insufficient documentation

## 2022-06-08 DIAGNOSIS — M545 Low back pain, unspecified: Secondary | ICD-10-CM

## 2022-06-08 DIAGNOSIS — M5442 Lumbago with sciatica, left side: Secondary | ICD-10-CM

## 2022-06-08 NOTE — Assessment & Plan Note (Signed)
Injection given today but if continuing to have difficulty I do think that another conduction study is necessary.  This does have some hypoechoic changes in the tendon sheath and hopefully will make some improvement but not completely optimistic with some of the neurologic symptoms patient is having.

## 2022-06-08 NOTE — Assessment & Plan Note (Signed)
Patient is having some radicular symptoms noted today with straight leg test.  Patient is having midline pain that is concerning as well.  Past medical history significant for rectal cancer.  X-rays and MRI ordered today to further evaluate.  Patient is going to be scheduled for a CT scan of the abdomen and pelvis as well for further evaluation.  Depending on all the findings we will see if anything is necessary and how this change medical management.

## 2022-06-08 NOTE — Patient Instructions (Addendum)
Injected wrist today Trigger point injections MRI lumbar [phone] See me again in 2 months just in case Worsening pain go into ED

## 2022-06-08 NOTE — Assessment & Plan Note (Signed)
Repeat injections given again today.  He is concerned that there is some more cervical radiculopathy and may need to consider the possibility of advanced imaging. He is currently in posttreatment as well.  Discussed icing regimen and home exercises compared to when patient is having more with his back pain I do feel that that is more warranted for further evaluation.

## 2022-06-08 NOTE — Telephone Encounter (Signed)
Called patient to make him aware that insureance is requiring a Peer -to Peer and our provider is not in the office during time they requested.  Asked to catact patient and make him aware along with encouraged to reach out to Oncology to see if they are able to get it approved.  Patient agreed and was open to reaching out to them to review Dr. Frutoso Chase note and order.  No additional questions at this time.

## 2022-06-09 ENCOUNTER — Ambulatory Visit: Payer: BC Managed Care – PPO

## 2022-06-16 ENCOUNTER — Ambulatory Visit
Admission: RE | Admit: 2022-06-16 | Discharge: 2022-06-16 | Disposition: A | Discharge status: Home / Self Care | Payer: BC Managed Care – PPO | Source: Ambulatory Visit | Attending: Family Medicine | Admitting: Family Medicine

## 2022-06-16 DIAGNOSIS — M545 Low back pain, unspecified: Secondary | ICD-10-CM

## 2022-06-20 ENCOUNTER — Encounter: Payer: Self-pay | Admitting: Family Medicine

## 2022-06-21 ENCOUNTER — Other Ambulatory Visit: Payer: Self-pay

## 2022-06-21 DIAGNOSIS — M5416 Radiculopathy, lumbar region: Secondary | ICD-10-CM

## 2022-06-22 NOTE — Progress Notes (Unsigned)
[name] [address] [place] [address] Phone: [phone] Subjective:   Edward [name], am serving as a scribe for Dr. Antoine Primas.  I'm seeing this patient by the request  of:  Myrlene Broker, MD  CC: Wrist pain and back pain  [id number]  06/08/2022 Patient is having some radicular symptoms noted today with straight leg test. Patient is having midline pain that is concerning as well. Past medical history significant for rectal cancer. X-rays and MRI ordered today to further evaluate. Patient is going to be scheduled for a CT scan of the abdomen and pelvis as well for further evaluation. Depending on all the findings we will see if anything is necessary and how this change medical management.   Repeat injections given again today.  He is concerned that there is some more cervical radiculopathy and may need to consider the possibility of advanced imaging. He is currently in posttreatment as well.  Discussed icing regimen and home exercises compared to when patient is having more with his back pain I do feel that that is more warranted for further evaluation.  Injection given today but if continuing to have difficulty I do think that another conduction study is necessary. This does have some hypoechoic changes in the tendon sheath and hopefully will make some improvement but not completely optimistic with some of the neurologic symptoms patient is having.   Updated 06/23/2022 Edward [name] is a 47 y.o. male coming in with complaint of shoulder, wrist, and back pain. Go over MRI results, what are next steps.  Patient did have an MRI of the lumbar spine that did show the patient did have a central disc protrusion that was causing pain on the left S1 nerve root and mild facet hypertrophy bilaterally patient is scheduled for an epidural Jun 28, 2022    Past Medical History:  Diagnosis Date   Arthritis    OA right knee   Family history of  prostate cancer    Kidney stones    has a horse shoe kidney   Thyroid disease    No past surgical history on file. Social History   Socioeconomic History   Marital status: Married    Spouse name: Not on file   Number of children: Not on file   Years of education: Not on file   Highest education level: Not on file  Occupational History   Not on file  Tobacco Use   Smoking status: Former   Smokeless tobacco: Never  Vaping Use   Vaping Use: Never used  Substance and Sexual Activity   Alcohol use: Yes    Comment: glass of wine at [facility]   Drug use: Never   Sexual activity: Not on file  Other Topics Concern   Not on file  Social History Narrative   Not on file   Social Determinants of Health   Financial Resource Strain: Not on file  Food Insecurity: Not on file  Transportation Needs: Not on file  Physical Activity: Not on file  Stress: Not on file  Social Connections: Not on file   No Known Allergies Family History  Problem Relation Age of Onset   Depression Mother    Asthma Father    Cancer Father    CAD Father    Hyperlipidemia Father    Hypertension Father    Colon cancer Neg Hx    Esophageal cancer Neg Hx    Rectal cancer Neg Hx  Stomach cancer Neg Hx     Current Outpatient Medications (Endocrine & Metabolic):    levothyroxine (SYNTHROID) 75 MCG tablet, TAKE 1 TABLET(75 MCG) BY MOUTH DAILY BEFORE BREAKFAST    Current Outpatient Medications (Analgesics):    meloxicam (MOBIC) 15 MG tablet, Take 1 tablet (15 mg total) by mouth daily.  Current Outpatient Medications (Hematological):    vitamin B-12 (CYANOCOBALAMIN) 1000 MCG tablet, Take 1,000 mcg by mouth daily.  Current Outpatient Medications (Other):    cholecalciferol (VITAMIN D3) 25 MCG (1000 UT) tablet, Take 2,000 Units by mouth 2 (two) times a day.   DULoxetine (CYMBALTA) 20 MG capsule, TAKE 1 CAPSULE(20 MG) BY MOUTH TWICE DAILY   hydrocortisone (ANUSOL-HC) 2.5 % rectal cream, Place 1 Application  rectally 2 (two) times daily.   tiZANidine (ZANAFLEX) 4 MG tablet, Take 1 tablet (4 mg total) by mouth at bedtime.   traZODone (DESYREL) 50 MG tablet, TAKE 1/2 TO 1 TABLET(25 TO 50 MG) BY MOUTH AT BEDTIME AS NEEDED FOR SLEEP   Objective  Blood pressure (!) 130/90, pulse 84, height 5\' 9"  (1.753 m), weight 163 lb (73.9 kg), SpO2 97 %.   General: No apparent distress alert and oriented x3 mood and affect normal, dressed appropriately.  HEENT: Pupils equal, extraocular movements intact  Respiratory: Patient's speak in full sentences and does not appear short of breath  Cardiovascular: No lower extremity edema, non tender, no erythema  Low back exam patient does seem to be comfortable.    Impression and Recommendations:    The above documentation has been reviewed and is accurate and complete Judi Saa, DO

## 2022-06-23 ENCOUNTER — Ambulatory Visit (INDEPENDENT_AMBULATORY_CARE_PROVIDER_SITE_OTHER): Payer: BC Managed Care – PPO | Admitting: Family Medicine

## 2022-06-23 VITALS — BP 130/90 | HR 84 | Ht 69.0 in | Wt 163.0 lb

## 2022-06-23 DIAGNOSIS — M5442 Lumbago with sciatica, left side: Secondary | ICD-10-CM

## 2022-06-23 DIAGNOSIS — M5416 Radiculopathy, lumbar region: Secondary | ICD-10-CM

## 2022-06-23 DIAGNOSIS — M5441 Lumbago with sciatica, right side: Secondary | ICD-10-CM

## 2022-06-23 NOTE — Patient Instructions (Addendum)
Nerve conduction study

## 2022-06-23 NOTE — Assessment & Plan Note (Signed)
Did discuss with patient at great length.  There is a misinterpretation in the read of the MRI stating that there is a potential for a suspicious lesion.  In fact looking at the MRI and there should likely be no suspicious lesion.  This did cause patient to have some anxiety.  Did discuss with patient about this.  Discussed and went over the pictures of the MRI today in great detail.  Showed him the partial protruding disc on the S1 nerve root that would be contributing.  Would like to consider the possibility of the epidural still which patient is scheduled for in 5 days.  I also discussed with patient about the possibility of a nerve conduction study to further evaluate if this is any other potential therapy that is causing more of a neuropathy or if there is anything else centrally that we need to concern.  Patient will have the nerve conduction studies for the lower extremity in the upper extremities in the near future.  Follow-up again in 6 to 8 weeks otherwise

## 2022-06-27 ENCOUNTER — Encounter: Payer: Self-pay | Admitting: Neurology

## 2022-06-27 ENCOUNTER — Other Ambulatory Visit: Payer: Self-pay

## 2022-06-27 DIAGNOSIS — R202 Paresthesia of skin: Secondary | ICD-10-CM

## 2022-06-28 ENCOUNTER — Ambulatory Visit
Admission: RE | Admit: 2022-06-28 | Discharge: 2022-06-28 | Disposition: A | Discharge status: Home / Self Care | Payer: BC Managed Care – PPO | Source: Ambulatory Visit | Attending: Family Medicine | Admitting: Family Medicine

## 2022-06-28 DIAGNOSIS — M5416 Radiculopathy, lumbar region: Secondary | ICD-10-CM

## 2022-06-28 MED ORDER — METHYLPREDNISOLONE ACETATE 40 MG/ML INJ SUSP (RADIOLOG
80.0000 mg | Freq: Once | INTRAMUSCULAR | Status: AC
  Administered 2022-06-28: 80 mg via EPIDURAL

## 2022-06-28 MED ORDER — IOPAMIDOL (ISOVUE-M 200) INJECTION 41%
1.0000 mL | Freq: Once | INTRAMUSCULAR | Status: AC
  Administered 2022-06-28: 1 mL via EPIDURAL

## 2022-06-28 NOTE — Discharge Instructions (Signed)

## 2022-07-12 ENCOUNTER — Other Ambulatory Visit: Payer: Self-pay | Admitting: Sports Medicine

## 2022-07-12 ENCOUNTER — Encounter: Payer: Self-pay | Admitting: Family Medicine

## 2022-07-12 ENCOUNTER — Other Ambulatory Visit: Payer: Self-pay

## 2022-07-12 DIAGNOSIS — M5416 Radiculopathy, lumbar region: Secondary | ICD-10-CM

## 2022-07-15 ENCOUNTER — Other Ambulatory Visit: Payer: Self-pay

## 2022-07-15 MED ORDER — MELOXICAM 15 MG PO TABS: 15.0000 mg | ORAL_TABLET | Freq: Every day | ORAL | 0 refills | Status: DC

## 2022-07-20 ENCOUNTER — Ambulatory Visit: Payer: BC Managed Care – PPO | Admitting: Family Medicine

## 2022-07-22 ENCOUNTER — Ambulatory Visit
Admission: RE | Admit: 2022-07-22 | Discharge: 2022-07-22 | Disposition: A | Discharge status: Home / Self Care | Payer: BC Managed Care – PPO | Source: Ambulatory Visit | Attending: Family Medicine | Admitting: Family Medicine

## 2022-07-22 ENCOUNTER — Ambulatory Visit (INDEPENDENT_AMBULATORY_CARE_PROVIDER_SITE_OTHER): Payer: BC Managed Care – PPO | Admitting: Neurology

## 2022-07-22 DIAGNOSIS — M5416 Radiculopathy, lumbar region: Secondary | ICD-10-CM

## 2022-07-22 DIAGNOSIS — R202 Paresthesia of skin: Secondary | ICD-10-CM

## 2022-07-22 MED ORDER — IOPAMIDOL (ISOVUE-M 200) INJECTION 41%
1.0000 mL | Freq: Once | INTRAMUSCULAR | Status: AC
  Administered 2022-07-22: 1 mL via EPIDURAL

## 2022-07-22 MED ORDER — METHYLPREDNISOLONE ACETATE 40 MG/ML INJ SUSP (RADIOLOG
80.0000 mg | Freq: Once | INTRAMUSCULAR | Status: AC
  Administered 2022-07-22: 80 mg via EPIDURAL

## 2022-07-22 NOTE — Discharge Instructions (Signed)

## 2022-07-22 NOTE — Procedures (Signed)
[facility]  [address] [place], [address]  [place], [place] [address] Tel: [phone] Fax: [phone] Test Date:  07/22/2022  Patient: Edward [name] DOB: 1975-10-03 Physician: Nita Sickle, DO  Sex: Male Height: 5\' 9"  Ref Phys: Antoine Primas, DO  ID#: [id number]   Technician:    History: This is a 48 year old man referred for evaluation wrist pain and left leg paresthesias.  NCV & EMG Findings: Extensive electrodiagnostic testing of the right upper extremity and left lower extremity shows:  Right median, right ulnar, right mixed palmar, left sural, and left superficial peroneal sensory responses are within normal limits. Right median, right ulnar, left peroneal, and left tibial motor responses are within normal limits. Left tibial H reflex study is within normal limits. There is no evidence of active or chronic motor axonal loss changes affecting any of the tested muscles.  Motor unit configuration and recruitment pattern is within normal limits.   Impression: This is a normal study of the right upper extremity and left lower extremity.    In particular, there is no evidence of a right carpal tunnel syndrome, right cervical radiculopathy, left lumbosacral radiculopathy, or large fiber sensorimotor polyneuropathy.   ___________________________ Nita Sickle, DO    Nerve Conduction Studies   Stim Site NR Peak (ms) Norm Peak (ms) O-P Amp (V) Norm O-P Amp  Right Median Anti Sensory (2nd Digit)  32 C  Wrist    2.6 <3.4 50.8 >20  Left Sup Peroneal Anti Sensory (Ant Lat Mall)  32 C  12 cm    2.3 <4.5 9.6 >5  Left Sural Anti Sensory (Lat Mall)  32 C  Calf    2.8 <4.5 20.5 >5  Right Ulnar Anti Sensory (5th Digit)  32 C  Wrist    2.4 <3.1 43.9 >12     Stim Site NR Onset (ms) Norm Onset (ms) O-P Amp (mV) Norm O-P Amp Site1 Site2 Delta-0 (ms) Dist (cm) Vel (m/s) Norm Vel (m/s)  Right Median Motor (Abd Poll Brev)  32 C  Wrist    2.7 <3.9 8.1 >6 Elbow Wrist 4.6  29.0 63 >50  Elbow    7.3  7.7         Left Peroneal Motor (Ext Dig Brev)  32 C  Ankle    4.1 <5.5 8.2 >3 B Fib Ankle 6.8 35.0 51 >40  B Fib    10.9  7.6  Poplt B Fib 1.6 8.0 50 >40  Poplt    12.5  7.4         Left Tibial Motor (Abd Hall Brev)  32 C  Ankle    3.3 <6.0 10.8 >8 Knee Ankle 8.3 42.0 51 >40  Knee    11.6  8.5         Right Ulnar Motor (Abd Dig Minimi)  32 C  Wrist    2.8 <3.1 9.9 >7 B Elbow Wrist 3.3 21.0 64 >50  B Elbow    6.1  9.6  A Elbow B Elbow 1.5 10.0 67 >50  A Elbow    7.6  9.6            Stim Site NR Peak (ms) Norm Peak (ms) P-T Amp (V) Site1 Site2 Delta-P (ms) Norm Delta (ms)  Right Median/Ulnar Palm Comparison (Wrist - 8cm)  32 C  Median Palm    1.2 <2.2 98.0 Median Palm Ulnar Palm 0.1   Ulnar Palm    1.3 <2.2 35.3  Electromyography   Side Muscle Ins.Act Fibs Fasc Recrt Amp Dur Poly Activation Comment  Left AntTibialis Nml Nml Nml Nml Nml Nml Nml Nml N/A  Left Gastroc Nml Nml Nml Nml Nml Nml Nml Nml N/A  Left Flex Dig Long Nml Nml Nml Nml Nml Nml Nml Nml N/A  Left BicepsFemS Nml Nml Nml Nml Nml Nml Nml Nml N/A  Left RectFemoris Nml Nml Nml Nml Nml Nml Nml Nml N/A  Left GluteusMed Nml Nml Nml Nml Nml Nml Nml Nml N/A  Right 1stDorInt Nml Nml Nml Nml Nml Nml Nml Nml N/A  Right PronatorTeres Nml Nml Nml Nml Nml Nml Nml Nml N/A  Right Biceps Nml Nml Nml Nml Nml Nml Nml Nml N/A  Right Triceps Nml Nml Nml Nml Nml Nml Nml Nml N/A  Right Deltoid Nml Nml Nml Nml Nml Nml Nml Nml N/A      Waveforms:

## 2022-07-27 NOTE — Progress Notes (Signed)
[name] [address] [place] [address] Phone: [phone] Subjective:   Edward [name], am serving as a scribe for Dr. Antoine Primas.  I'm seeing this patient by the request  of:  Myrlene Broker, MD  CC: back pain follow up   [id number]  06/23/2022 Did discuss with patient at great length. There is a misinterpretation in the read of the MRI stating that there is a potential for a suspicious lesion. In fact looking at the MRI and there should likely be no suspicious lesion. This did cause patient to have some anxiety. Did discuss with patient about this. Discussed and went over the pictures of the MRI today in great detail. Showed him the partial protruding disc on the S1 nerve root that would be contributing. Would like to consider the possibility of the epidural still which patient is scheduled for in 5 days. I also discussed with patient about the possibility of a nerve conduction study to further evaluate if this is any other potential therapy that is causing more of a neuropathy or if there is anything else centrally that we need to concern. Patient will have the nerve conduction studies for the lower extremity in the upper extremities in the near future. Follow-up again in 6 to 8 weeks otherwise   Updated 07/28/2022 Edward [name] is a 47 y.o. male coming in with complaint of back and wrist pain. OMT seems to be helping with back pan. Wrist is still sore and in same area. Injection in wrist didn't seem to help much.       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 Social History   Socioeconomic History   Marital status: Married    Spouse name: Not on file   Number of children: Not on file   Years of education: Not on file   Highest education level: Not on file   Occupational History   Not on file  Tobacco Use   Smoking status: Former   Smokeless tobacco: Never  Vaping Use   Vaping Use: Never used  Substance and Sexual Activity   Alcohol use: Yes    Comment: glass of wine at noc   Drug use: Never   Sexual activity: Not on file  Other Topics Concern   Not on file  Social History Narrative   Not on file   Social Determinants of Health   Financial Resource Strain: Not on file  Food Insecurity: Not on file  Transportation Needs: Not on file  Physical Activity: Not on file  Stress: Not on file  Social Connections: Not on file   No Known Allergies Family History  Problem Relation Age of Onset   Depression Mother    Asthma Father    Cancer Father    CAD Father    Hyperlipidemia Father    Hypertension Father    Colon cancer Neg Hx    Esophageal cancer Neg Hx    Rectal cancer Neg Hx    Stomach cancer Neg Hx     Current Outpatient Medications (Endocrine & Metabolic):    levothyroxine (SYNTHROID) 75 MCG tablet, TAKE 1 TABLET(75 MCG) BY MOUTH DAILY BEFORE BREAKFAST    Current Outpatient Medications (Analgesics):    meloxicam (MOBIC) 15  MG tablet, Take 1 tablet (15 mg total) by mouth daily.  Current Outpatient Medications (Hematological):    vitamin B-12 (CYANOCOBALAMIN) 1000 MCG tablet, Take 1,000 mcg by mouth daily.  Current Outpatient Medications (Other):    cholecalciferol (VITAMIN D3) 25 MCG (1000 UT) tablet, Take 2,000 Units by mouth 2 (two) times a day.   DULoxetine (CYMBALTA) 20 MG capsule, TAKE 1 CAPSULE(20 MG) BY MOUTH TWICE DAILY   hydrocortisone (ANUSOL-HC) 2.5 % rectal cream, Place 1 Application rectally 2 (two) times daily.   tiZANidine (ZANAFLEX) 4 MG tablet, Take 1 tablet (4 mg total) by mouth at bedtime.   traZODone (DESYREL) 50 MG tablet, TAKE 1/2 TO 1 TABLET(25 TO 50 MG) BY MOUTH AT BEDTIME AS NEEDED FOR SLEEP   Reviewed prior external information including notes and imaging from  primary care provider As  well as notes that were available from care everywhere and other healthcare systems.  Past medical history, social, surgical and family history all reviewed in electronic medical record.  No pertanent information unless stated regarding to the chief complaint.   Review of Systems:  No headache, visual changes, nausea, vomiting, diarrhea, constipation, dizziness, abdominal pain, skin rash, fevers, chills, night sweats, weight loss, swollen lymph nodes, body aches, joint swelling, chest pain, shortness of breath, mood changes. POSITIVE muscle aches  Objective  Blood pressure (!) 132/90, pulse (!) 106, height 5\' 9"  (1.753 m), weight 167 lb (75.8 kg), SpO2 97 %.   General: No apparent distress alert and oriented x3 mood and affect normal, dressed appropriately.  HEENT: Pupils equal, extraocular movements intact  Respiratory: Patient's speak in full sentences and does not appear short of breath  Cardiovascular: No lower extremity edema, non tender, no erythema  Right wrist exam shows the patient does have some limited range of motion.  Patient only has 5 degrees of extension of the wrist noted.  Good grip strength noted.  Tender to palpation over the palpation over the mid wrist.  Limited muscular skeletal ultrasound was performed and interpreted by Antoine Primas, M  Limited ultrasound shows improvement in the hypoechoic changes that we saw in the tendon sheath previously.  Patient though unfortunately have hypoechoic changes of the TFCC with increasing Doppler flow that can be consistent with a partial tear. Impression: TFCC abnormality    Impression and Recommendations:    The above documentation has been reviewed and is accurate and complete Judi Saa, DO

## 2022-07-28 ENCOUNTER — Ambulatory Visit (INDEPENDENT_AMBULATORY_CARE_PROVIDER_SITE_OTHER): Payer: BC Managed Care – PPO | Admitting: Family Medicine

## 2022-07-28 ENCOUNTER — Other Ambulatory Visit: Payer: Self-pay

## 2022-07-28 ENCOUNTER — Encounter: Payer: Self-pay | Admitting: Family Medicine

## 2022-07-28 VITALS — BP 132/90 | HR 106 | Ht 69.0 in | Wt 167.0 lb

## 2022-07-28 DIAGNOSIS — M5441 Lumbago with sciatica, right side: Secondary | ICD-10-CM

## 2022-07-28 DIAGNOSIS — M5442 Lumbago with sciatica, left side: Secondary | ICD-10-CM | POA: Diagnosis not present

## 2022-07-28 DIAGNOSIS — M25531 Pain in right wrist: Secondary | ICD-10-CM | POA: Diagnosis not present

## 2022-07-28 DIAGNOSIS — G5601 Carpal tunnel syndrome, right upper limb: Secondary | ICD-10-CM | POA: Diagnosis not present

## 2022-07-28 DIAGNOSIS — G2589 Other specified extrapyramidal and movement disorders: Secondary | ICD-10-CM

## 2022-07-28 NOTE — Patient Instructions (Addendum)
MRA R wrist [id number] See me in 7 weeks for the back Ok to start running in 1 weeks

## 2022-07-28 NOTE — Assessment & Plan Note (Signed)
Still responding relatively well to scapular dyskinesis and osteophyte manipulation.  Encourage patient to continue to work on home exercises and icing regimen.  Will make no other significant changes in medications at this time.  Follow-up again in 6 to 8 weeks.

## 2022-07-28 NOTE — Assessment & Plan Note (Signed)
Low back does seem to be better after the epidurals.  We will continue to monitor.  Will likely restart osteopathic manipulation as another modality at follow-up.

## 2022-07-28 NOTE — Assessment & Plan Note (Signed)
Patient's right wrist pain is not getting better.  Failed all conservative therapy including an injection.  Patient does have a what seems to be resolution of the swelling in the tendon sheath but a questionable abnormality noted of the TFCC.  At this point I would like him to have advanced imaging with him failing formal physical therapy and injections and anti-inflammatories.  Will do an MR arthrogram to further evaluate the TFCC.  Depending on findings patient would consider surgical intervention necessary.

## 2022-08-04 ENCOUNTER — Ambulatory Visit (INDEPENDENT_AMBULATORY_CARE_PROVIDER_SITE_OTHER): Payer: BC Managed Care – PPO | Admitting: Gastroenterology

## 2022-08-04 ENCOUNTER — Encounter: Payer: Self-pay | Admitting: Gastroenterology

## 2022-08-04 VITALS — BP 118/86 | HR 80 | Ht 69.0 in | Wt 167.2 lb

## 2022-08-04 DIAGNOSIS — Z85038 Personal history of other malignant neoplasm of large intestine: Secondary | ICD-10-CM

## 2022-08-04 MED ORDER — NA SULFATE-K SULFATE-MG SULF 17.5-3.13-1.6 GM/177ML PO SOLN: 1.0000 | Freq: Once | ORAL | 0 refills | Status: AC

## 2022-08-04 NOTE — Patient Instructions (Signed)
You have been scheduled for a colonoscopy. Please follow written instructions given to you at your visit today.  Please pick up your prep supplies at the pharmacy within the next 1-3 days. If you use inhalers (even only as needed), please bring them with you on the day of your procedure.   Due to recent changes in healthcare laws, you may see the results of your imaging and laboratory studies on MyChart before your provider has had a chance to review them.  We understand that in some cases there may be results that are confusing or concerning to you. Not all laboratory results come back in the same time frame and the provider may be waiting for multiple results in order to interpret others.  Please give us 48 hours in order for your provider to thoroughly review all the results before contacting the office for clarification of your results.   Thank you for choosing me and [facility].  Vito Cirigliano, D.O.  

## 2022-08-04 NOTE — Progress Notes (Signed)
Chief Complaint:    Rectal cancer surveillance  GI History: 47 year old male with rectal adenocarcinoma, cT3 N indeterminat cM0, diagnosed on index screening colonoscopy in 11/2020.   -12/04/2020: Colonoscopy: Rectal mass located 5 cm from the anal verge (path: Adenocarcinoma).  Otherwise normal-appearing colon.  Normal TI - CEA normal - Genetics evaluation: Normal/no gene mutation -12/16/2020: CT C/A/P: 3 x 2.9 cm mass in the low rectum.  Incidental horseshoe kidney and prostamegaly.  No evidence of metastasis. - 12/16/2020: MRI pelvis: 4 cm T3 mass in low rectum extending 4-8 cm above anal verge, semicircumferential. - Treated with TNT, then LAR on 10/07/2021, followed by CT drain placement for pelvic fluid collection on 10/21/2021.  Now s/p DLI takedown 03/17/2022.  HPI:     Patient is a 47 y.o. male presenting to the [facility]ology [facility] for evaluation of repeat colonoscopy.  He was last seen by me on 12/30/2020 for follow-up after colonoscopy in 11/2020 demonstrating rectal cancer as outlined above, and since then has completed chemotherapy, radiation, and LAR as outlined above.  Was last seen in the [facility] 07/13/2022.  Palpable anastomosis 2 cm from the anal verge.  Proctoscope performed to 12 cm and revealed intact anastomosis at approximately 2 cm without evidence of recurrence.  Most recent CEA in 07/13/2022 was normal.  He is referred today by the [facility] for repeat colonoscopy.    Still with intermittent urgency. Started imodium with response.  Otherwise no hematochezia or melena.  Review of systems:     No chest pain, no SOB, no fevers, no urinary sx   Past Medical History:  Diagnosis Date   Arthritis    OA right knee   Family history of prostate cancer    Kidney stones    has a horse shoe kidney   Rectal cancer (HCC)    Thyroid disease     Patient's surgical history, family medical history, social history, medications and allergies were all  reviewed in Epic    Current Outpatient Medications  Medication Sig Dispense Refill   cholecalciferol (VITAMIN D3) 25 MCG (1000 UT) tablet Take 2,000 Units by mouth 2 (two) times a day.     DULoxetine (CYMBALTA) 20 MG capsule TAKE 1 CAPSULE(20 MG) BY MOUTH TWICE DAILY 180 capsule 3   hydrocortisone (ANUSOL-HC) 2.5 % rectal cream Place 1 Application rectally 2 (two) times daily. 30 g 0   levothyroxine (SYNTHROID) 75 MCG tablet TAKE 1 TABLET(75 MCG) BY MOUTH DAILY BEFORE BREAKFAST 90 tablet 3   meloxicam (MOBIC) 15 MG tablet Take 1 tablet (15 mg total) by mouth daily. 90 tablet 0   traZODone (DESYREL) 50 MG tablet TAKE 1/2 TO 1 TABLET(25 TO 50 MG) BY MOUTH AT BEDTIME AS NEEDED FOR SLEEP 90 tablet 2   vitamin B-12 (CYANOCOBALAMIN) 1000 MCG tablet Take 1,000 mcg by mouth daily.     No current facility-administered medications for this visit.    Physical Exam:     BP 118/86   Pulse 80   Ht 5\' 9"  (1.753 m)   Wt 167 lb 4 oz (75.9 kg)   BMI 24.70 kg/m   GENERAL:  Pleasant male in NAD PSYCH: : Cooperative, normal affect Musculoskeletal:  Normal muscle tone, normal strength NEURO: Alert and oriented x 3, no focal neurologic deficits   IMPRESSION and PLAN:    1) History of colon cancer 47 year old male with rectal adenocarcinoma, cT3 N indeterminat cM0, diagnosed on index screening colonoscopy in 11/2020, now s/p neoadjuvant chemotherapy and  radiation completed in 06/2021, then lap LAR with DLI on 10/07/2021, with loop ileostomy takedown 03/17/2022.  - Repeat colonoscopy for ongoing surveillance   The indications, risks, and benefits of colonoscopy were explained to the patient in detail. Risks include but are not limited to bleeding, perforation, adverse reaction to medications, and cardiopulmonary compromise. Sequelae include but are not limited to the possibility of surgery, hospitalization, and mortality. The patient verbalized understanding and wished to proceed. All questions answered,  referred to the scheduler and bowel prep ordered. Further recommendations pending results of the exam.          Shellia Cleverly ,DO, FACG 08/04/2022, 4:16 PM

## 2022-08-05 ENCOUNTER — Ambulatory Visit
Admission: RE | Admit: 2022-08-05 | Discharge: 2022-08-05 | Disposition: A | Discharge status: Home / Self Care | Payer: BC Managed Care – PPO | Source: Ambulatory Visit | Attending: Family Medicine | Admitting: Family Medicine

## 2022-08-05 DIAGNOSIS — G5601 Carpal tunnel syndrome, right upper limb: Secondary | ICD-10-CM

## 2022-08-05 MED ORDER — GADOPICLENOL 0.5 MMOL/ML IV SOLN
8.0000 mL | Freq: Once | INTRAVENOUS | Status: AC | PRN
  Administered 2022-08-05: 8 mL via INTRAVENOUS

## 2022-08-14 ENCOUNTER — Encounter: Payer: Self-pay | Admitting: Family Medicine

## 2022-08-15 NOTE — Progress Notes (Unsigned)
[name] [address] [place] [address] Phone: [phone] Subjective:   [name], am serving as a scribe for Dr. Antoine Primas.  I'm seeing this patient by the request  of:  Myrlene Broker, MD  CC: Wrist pain follow-up  [id number]  07/28/2022 Patient's right wrist pain is not getting better. Failed all conservative therapy including an injection. Patient does have a what seems to be resolution of the swelling in the tendon sheath but a questionable abnormality noted of the TFCC. At this point I would like him to have advanced imaging with him failing formal physical therapy and injections and anti-inflammatories. Will do an MR arthrogram to further evaluate the TFCC. Depending on findings patient would consider surgical intervention necessary.   Updated 08/17/2022 Edward [name] is a 47 y.o. male coming in with complaint of R wrist pain here for PRP.  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     Past Medical History:  Diagnosis Date   Arthritis    OA right knee   Family history of prostate cancer    Kidney stones    has a horse shoe kidney   Rectal cancer (HCC)    Thyroid disease    No past surgical history on file. Social History   Socioeconomic History   Marital status: Married    Spouse name: Not on file   Number of children: Not on file   Years of education: Not on file   Highest education level: Not on file  Occupational History   Not on file  Tobacco Use   Smoking status: Former   Smokeless tobacco: Never  Vaping Use   Vaping Use: Never used  Substance and Sexual Activity   Alcohol use: Yes    Comment: glass of wine at noc   Drug use: Never   Sexual activity: Not on file  Other Topics Concern   Not on file  Social History Narrative   Not on file   Social Determinants of Health    Financial Resource Strain: Not on file  Food Insecurity: Not on file  Transportation Needs: Not on file  Physical Activity: Not on file  Stress: Not on file  Social Connections: Not on file   No Known Allergies Family History  Problem Relation Age of Onset   Depression Mother    Asthma Father    Cancer Father    CAD Father    Hyperlipidemia Father    Hypertension Father    Colon cancer Neg Hx    Esophageal cancer Neg Hx    Rectal cancer Neg Hx    Stomach cancer Neg Hx     Current Outpatient Medications (Endocrine & Metabolic):    levothyroxine (SYNTHROID) 75 MCG tablet, TAKE 1 TABLET(75 MCG) BY MOUTH DAILY BEFORE BREAKFAST    Current Outpatient Medications (Analgesics):    meloxicam (MOBIC) 15 MG tablet, Take 1 tablet (15 mg total) by mouth daily.  Current Outpatient Medications (Hematological):    vitamin B-12 (CYANOCOBALAMIN) 1000 MCG tablet, Take 1,000 mcg by mouth daily.  Current Outpatient Medications (Other):    cholecalciferol (VITAMIN D3) 25 MCG (1000 UT) tablet, Take 2,000 Units by mouth 2 (two) times a day.   DULoxetine (CYMBALTA) 20 MG capsule, TAKE 1 CAPSULE(20 MG) BY MOUTH TWICE DAILY   hydrocortisone (ANUSOL-HC) 2.5 % rectal cream, Place 1 Application rectally 2 (two)  times daily.   traZODone (DESYREL) 50 MG tablet, TAKE 1/2 TO 1 TABLET(25 TO 50 MG) BY MOUTH AT BEDTIME AS NEEDED FOR SLEEP   Reviewed prior external information including notes and imaging from  primary care provider As well as notes that were available from care everywhere and other healthcare systems.  Past medical history, social, surgical and family history all reviewed in electronic medical record.  No pertanent information unless stated regarding to the chief complaint.   Review of Systems:  No headache, visual changes, nausea, vomiting, diarrhea, constipation, dizziness, abdominal pain, skin rash, fevers, chills, night sweats, weight loss, swollen lymph nodes, body aches, joint  swelling, chest pain, shortness of breath, mood changes. POSITIVE muscle aches  Objective  There were no vitals taken for this visit.   General: No apparent distress alert and oriented x3 mood and affect normal, dressed appropriately.  Procedure: Real-time Ultrasound Guided Injection of extensor carpi ulnaris tendon sheath Device: GE Logiq Q7 Ultrasound guided injection is preferred based studies that show increased duration, increased effect, greater accuracy, decreased procedural pain, increased response rate, and decreased cost with ultrasound guided versus blind injection.  Verbal informed consent obtained.  Time-out conducted.  Noted no overlying erythema, induration, or other signs of local infection.  Skin prepped in a sterile fashion.  Local anesthesia: Topical Ethyl chloride.  With sterile technique and under real time ultrasound guidance: With a 25-gauge half inch needle injected with 0.5 cc of 0.5% Marcaine then injected with 2 cc of PRP we repeated the morning continue TFCC area as well. Completed without difficulty  Advised to call if fevers/chills, erythema, induration, drainage, or persistent bleeding.  Impression: Technically successful ultrasound guided injection.     Impression and Recommendations:    The above documentation has been reviewed and is accurate and complete Judi Saa, DO

## 2022-08-17 ENCOUNTER — Other Ambulatory Visit: Payer: Self-pay

## 2022-08-17 ENCOUNTER — Ambulatory Visit (INDEPENDENT_AMBULATORY_CARE_PROVIDER_SITE_OTHER): Payer: Self-pay | Admitting: Family Medicine

## 2022-08-17 ENCOUNTER — Encounter: Payer: Self-pay | Admitting: Family Medicine

## 2022-08-17 VITALS — BP 130/88 | HR 93 | Ht 69.0 in | Wt 167.0 lb

## 2022-08-17 DIAGNOSIS — M778 Other enthesopathies, not elsewhere classified: Secondary | ICD-10-CM

## 2022-08-17 DIAGNOSIS — G5601 Carpal tunnel syndrome, right upper limb: Secondary | ICD-10-CM

## 2022-08-17 NOTE — Patient Instructions (Signed)
No ice or ibuprofen for 3 days Tylenol and heat are ok Follow exercises as instructed Follow up in 6 weeks  

## 2022-08-17 NOTE — Assessment & Plan Note (Signed)
PRP given, post PRP instructions given. Discussed potential bracing for the next 10 days.  Follow-up with me again in 6 to 8 weeks

## 2022-08-25 ENCOUNTER — Encounter: Payer: Self-pay | Admitting: Gastroenterology

## 2022-08-25 ENCOUNTER — Ambulatory Visit (AMBULATORY_SURGERY_CENTER): Payer: BC Managed Care – PPO | Admitting: Gastroenterology

## 2022-08-25 VITALS — BP 115/78 | HR 64 | Temp 98.2°F | Resp 12 | Ht 69.0 in | Wt 167.0 lb

## 2022-08-25 DIAGNOSIS — Z08 Encounter for follow-up examination after completed treatment for malignant neoplasm: Secondary | ICD-10-CM

## 2022-08-25 DIAGNOSIS — Z85038 Personal history of other malignant neoplasm of large intestine: Secondary | ICD-10-CM

## 2022-08-25 MED ORDER — SODIUM CHLORIDE 0.9 % IV SOLN
500.0000 mL | Freq: Once | INTRAVENOUS | Status: DC
Start: 2022-08-25 — End: 2022-08-25

## 2022-08-25 NOTE — Progress Notes (Signed)
Sedate, gd SR, tolerated procedure well, VSS, report to RN 

## 2022-08-25 NOTE — Op Note (Signed)
[facility] Patient Name: Edward [name] Procedure Date: 08/25/2022 8:43 AM MRN: [id number] Endoscopist: Doristine Locks , MD, [id number] Age: 47 Referring MD:  Date of Birth: December 27, 1975 Gender: Male Account #: [id number] Procedure:                Colonoscopy Indications:              High risk colon cancer surveillance: Personal                            history of colon cancer                           47 year old male with rectal adenocarcinoma, cT3 N                            indeterminat cM0, diagnosed on index screening                            colonoscopy in 11/2020, now s/p neoadjuvant                            chemotherapy and radiation completed in 06/2021,                            then lap LAR with DLI on 10/07/2021, with loop                            ileostomy takedown 03/17/2022. Medicines:                Monitored Anesthesia Care Procedure:                Pre-Anesthesia Assessment:                           - Prior to the procedure, a History and Physical                            was performed, and patient medications and                            allergies were reviewed. The patient's tolerance of                            previous anesthesia was also reviewed. The risks                            and benefits of the procedure and the sedation                            options and risks were discussed with the patient.                            All questions were answered, and informed consent  was obtained. Prior Anticoagulants: The patient has                            taken no anticoagulant or antiplatelet agents. ASA                            Grade Assessment: II - A patient with mild systemic                            disease. After reviewing the risks and benefits,                            the patient was deemed in satisfactory condition to                            undergo the procedure.                            After obtaining informed consent, the colonoscope                            was passed under direct vision. Throughout the                            procedure, the patient's blood pressure, pulse, and                            oxygen saturations were monitored continuously. The                            Olympus Scope SN: [id number] was introduced through                            the anus and advanced to the the terminal ileum.                            The colonoscopy was performed without difficulty.                            The patient tolerated the procedure well. The                            quality of the bowel preparation was good. The                            terminal ileum, ileocecal valve, appendiceal                            orifice, and rectum were photographed. Scope In: 8:50:05 AM Scope Out: 9:03:40 AM Scope Withdrawal Time: 0 hours 11 minutes 38 seconds  Total Procedure Duration: 0 hours 13 minutes 35 seconds  Findings:                 The perianal and digital rectal examinations were  normal.                           There was evidence of a prior end-to-end                            colo-colonic anastomosis in the rectum. There was 1                            visible surgical staple. There was a small area of                            slightly nodular mucosa immediately distal to the                            surgical site. Endoscopically, favor this to be                            benign post-operative anatomic change, but given                            his clinical history and out of abundance of                            caution, I opted to biopsy this area with cold                            forceps. The remainder of the anastamosis was                            otherwise healthy and normal appearing and easily                            traversed. Estimated blood loss was minimal.                           The  remainder of the colon appeared normal.                           The terminal ileum appeared normal. Complications:            No immediate complications. Estimated Blood Loss:     Estimated blood loss was minimal. Impression:               - Patent end-to-end colo-colonic anastomosis,                            characterized by healthy appearing mucosa. There                            was a small area of nodularity immediately distal                            to the anastamosis. Endoscopically, favor this to  be benign post-operative anatomic change, but given                            his clinical history and out of abundance of                            caution, I opted to biopsy this area with cold                            forceps.                           - The remainder of the colon is normal appearing.                           - The examined portion of the ileum was normal. Recommendation:           - Patient has a contact number available for                            emergencies. The signs and symptoms of potential                            delayed complications were discussed with the                            patient. Return to normal activities tomorrow.                            Written discharge instructions were provided to the                            patient.                           - Resume previous diet.                           - Continue present medications.                           - Await pathology results.                           - Repeat colonoscopy in 3 years for surveillance.                           - Return to GI office PRN. Doristine Locks, MD 08/25/2022 9:12:06 AM

## 2022-08-25 NOTE — Patient Instructions (Signed)
Await pathology results.  Resume previous diet.  Continue present medications.  Repeat colonoscopy in 3 years for surveillance.  Return to GI office as needed.  YOU HAD AN ENDOSCOPIC PROCEDURE TODAY AT THE [facility]:   Refer to the procedure report that was given to you for any specific questions about what was found during the examination.  If the procedure report does not answer your questions, please call your gastroenterologist to clarify.  If you requested that your care partner not be given the details of your procedure findings, then the procedure report has been included in a sealed envelope for you to review at your convenience later.  YOU SHOULD EXPECT: Some feelings of bloating in the abdomen. Passage of more gas than usual.  Walking can help get rid of the air that was put into your GI tract during the procedure and reduce the bloating. If you had a lower endoscopy (such as a colonoscopy or flexible sigmoidoscopy) you may notice spotting of blood in your stool or on the toilet paper. If you underwent a bowel prep for your procedure, you may not have a normal bowel movement for a few days.  Please Note:  You might notice some irritation and congestion in your nose or some drainage.  This is from the oxygen used during your procedure.  There is no need for concern and it should clear up in a day or so.  SYMPTOMS TO REPORT IMMEDIATELY:  Following lower endoscopy (colonoscopy or flexible sigmoidoscopy):  Excessive amounts of blood in the stool  Significant tenderness or worsening of abdominal pains  Swelling of the abdomen that is new, acute  Fever of 100F or higher   For urgent or emergent issues, a gastroenterologist can be reached at any hour by calling [phone]. Do not use MyChart messaging for urgent concerns.    DIET:  We do recommend a small meal at first, but then you may proceed to your regular diet.  Drink plenty of fluids but you should avoid  alcoholic beverages for 24 hours.  ACTIVITY:  You should plan to take it easy for the rest of today and you should NOT DRIVE or use heavy machinery until tomorrow (because of the sedation medicines used during the test).    FOLLOW UP: Our staff will call the number listed on your records the next business day following your procedure.  We will call around 7:15- 8:00 am to check on you and address any questions or concerns that you may have regarding the information given to you following your procedure. If we do not reach you, we will leave a message.     If any biopsies were taken you will be contacted by phone or by letter within the next 1-3 weeks.  Please call us at [phone] if you have not heard about the biopsies in 3 weeks.    SIGNATURES/CONFIDENTIALITY: You and/or your care partner have signed paperwork which will be entered into your electronic medical record.  These signatures attest to the fact that that the information above on your After Visit Summary has been reviewed and is understood.  Full responsibility of the confidentiality of this discharge information lies with you and/or your care-partner.

## 2022-08-25 NOTE — Progress Notes (Signed)
Called to room to assist during endoscopic procedure.  Patient ID and intended procedure confirmed with present staff. Received instructions for my participation in the procedure from the performing physician.  

## 2022-08-25 NOTE — Progress Notes (Signed)
GASTROENTEROLOGY PROCEDURE H&P NOTE   Primary Care Physician: Myrlene Broker, MD    Reason for Procedure:   Rectal cancer surveillance   Plan:    Colonoscopy  Patient is appropriate for endoscopic procedure(s) in the ambulatory ([facility]) setting.  The nature of the procedure, as well as the risks, benefits, and alternatives were carefully and thoroughly reviewed with the patient. Ample time for discussion and questions allowed. The patient understood, was satisfied, and agreed to proceed.     HPI: Edward [name] is a 47 y.o. male male with rectal adenocarcinoma, cT3 N indeterminat cM0 who presents for colonoscopy for ongoing colon cancer surveillance. Patient was most recently seen in the Gastroenterology Clinic on 08/04/2022 by me.  No interval change in medical history since that appointment. Please refer to that note for full details regarding GI history and clinical presentation.  -12/04/2020: Colonoscopy: Rectal mass located 5 cm from the anal verge (path: Adenocarcinoma).  Otherwise normal-appearing colon.  Normal TI - CEA normal - Genetics evaluation: Normal/no gene mutation -12/16/2020: CT C/A/P: 3 x 2.9 cm mass in the low rectum.  Incidental horseshoe kidney and prostamegaly.  No evidence of metastasis. - 12/16/2020: MRI pelvis: 4 cm T3 mass in low rectum extending 4-8 cm above anal verge, semicircumferential. - Treated with TNT, then LAR on 10/07/2021, followed by CT drain placement for pelvic fluid collection on 10/21/2021.  Now s/p DLI takedown 03/17/2022.  Was last seen in the [facility] 07/13/2022.  Palpable anastomosis 2 cm from the anal verge.  Proctoscope performed to 12 cm and revealed intact anastomosis at approximately 2 cm without evidence of recurrence.  Most recent CEA in 07/13/2022 was normal.   Past Medical History:  Diagnosis Date   Arthritis    OA right knee   Family history of prostate cancer    Kidney stones    has a horse shoe kidney    Rectal cancer (HCC)    Thyroid disease     History reviewed. No pertinent surgical history.  Prior to Admission medications   Medication Sig Start Date End Date Taking? Authorizing Provider  cholecalciferol (VITAMIN D3) 25 MCG (1000 UT) tablet Take 2,000 Units by mouth 2 (two) times a day.   Yes [provider]  DULoxetine (CYMBALTA) 20 MG capsule TAKE 1 CAPSULE(20 MG) BY MOUTH TWICE DAILY 03/18/22  Yes Myrlene Broker, MD  hydrocortisone (ANUSOL-HC) 2.5 % rectal cream Place 1 Application rectally 2 (two) times daily. 03/28/22  Yes Myrlene Broker, MD  levothyroxine (SYNTHROID) 75 MCG tablet TAKE 1 TABLET(75 MCG) BY MOUTH DAILY BEFORE BREAKFAST 12/20/21  Yes Myrlene Broker, MD  meloxicam (MOBIC) 15 MG tablet Take 1 tablet (15 mg total) by mouth daily. 07/15/22  Yes Judi Saa, DO  traZODone (DESYREL) 50 MG tablet TAKE 1/2 TO 1 TABLET(25 TO 50 MG) BY MOUTH AT BEDTIME AS NEEDED FOR SLEEP 05/09/22  Yes Myrlene Broker, MD  vitamin B-12 (CYANOCOBALAMIN) 1000 MCG tablet Take 1,000 mcg by mouth daily.   Yes [provider]    Current Outpatient Medications  Medication Sig Dispense Refill   cholecalciferol (VITAMIN D3) 25 MCG (1000 UT) tablet Take 2,000 Units by mouth 2 (two) times a day.     DULoxetine (CYMBALTA) 20 MG capsule TAKE 1 CAPSULE(20 MG) BY MOUTH TWICE DAILY 180 capsule 3   hydrocortisone (ANUSOL-HC) 2.5 % rectal cream Place 1 Application rectally 2 (two) times daily. 30 g 0   levothyroxine (SYNTHROID) 75 MCG  tablet TAKE 1 TABLET(75 MCG) BY MOUTH DAILY BEFORE BREAKFAST 90 tablet 3   meloxicam (MOBIC) 15 MG tablet Take 1 tablet (15 mg total) by mouth daily. 90 tablet 0   traZODone (DESYREL) 50 MG tablet TAKE 1/2 TO 1 TABLET(25 TO 50 MG) BY MOUTH AT BEDTIME AS NEEDED FOR SLEEP 90 tablet 2   vitamin B-12 (CYANOCOBALAMIN) 1000 MCG tablet Take 1,000 mcg by mouth daily.     Current Facility-Administered Medications  Medication Dose Route Frequency  Provider Last Rate Last Admin   0.9 %  sodium chloride infusion  500 mL Intravenous Once Eliab Closson V, DO        Allergies as of 08/25/2022   (No Known Allergies)    Family History  Problem Relation Age of Onset   Depression Mother    Asthma Father    Cancer Father    CAD Father    Hyperlipidemia Father    Hypertension Father    Colon cancer Neg Hx    Esophageal cancer Neg Hx    Rectal cancer Neg Hx    Stomach cancer Neg Hx     Social History   Socioeconomic History   Marital status: Married    Spouse name: Not on file   Number of children: Not on file   Years of education: Not on file   Highest education level: Not on file  Occupational History   Not on file  Tobacco Use   Smoking status: Former   Smokeless tobacco: Never  Vaping Use   Vaping status: Never Used  Substance and Sexual Activity   Alcohol use: Yes    Comment: glass of wine at noc   Drug use: Never   Sexual activity: Not on file  Other Topics Concern   Not on file  Social History Narrative   Not on file   Social Determinants of Health   Financial Resource Strain: Low Risk  (03/22/2022)   Received from [facility], [facility]   Overall Financial Resource Strain (CARDIA)    Difficulty of Paying Living Expenses: Not hard at all  Food Insecurity: No Food Insecurity (03/22/2022)   Received from [facility], [facility]   Hunger Vital Sign    Worried About Running Out of Food in the Last Year: Never true    Ran Out of Food in the Last Year: Never true  Transportation Needs: No Transportation Needs (03/22/2022)   Received from [facility], [facility]   [facility] - Transportation    In the past 12 months, has lack of transportation kept you from medical appointments or from getting medications?: No    Lack of Transportation (Non-Medical): No  Physical Activity: Not on file  Stress: Not on  file  Social Connections: Not on file  Intimate Partner Violence: Not on file    Physical Exam: Vital signs in last 24 hours: @BP  122/82   Pulse 75   Temp 98.2 F (36.8 C)   Ht 5\' 9"  (1.753 m)   Wt 167 lb (75.8 kg)   SpO2 97%   BMI 24.66 kg/m  GEN: NAD EYE: Sclerae anicteric ENT: MMM CV: Non-tachycardic Pulm: CTA b/l GI: Soft, NT/ND NEURO:  Alert & Oriented x 3   Doristine Locks, DO [facility]   08/25/2022 8:16 AM

## 2022-08-26 ENCOUNTER — Ambulatory Visit (INDEPENDENT_AMBULATORY_CARE_PROVIDER_SITE_OTHER): Payer: BC Managed Care – PPO | Admitting: Family Medicine

## 2022-08-26 ENCOUNTER — Telehealth: Payer: Self-pay | Admitting: *Deleted

## 2022-08-26 ENCOUNTER — Encounter: Payer: Self-pay | Admitting: Family Medicine

## 2022-08-26 VITALS — BP 130/82 | HR 78 | Temp 97.8°F | Ht 69.0 in | Wt 165.0 lb

## 2022-08-26 DIAGNOSIS — H9201 Otalgia, right ear: Secondary | ICD-10-CM

## 2022-08-26 DIAGNOSIS — J029 Acute pharyngitis, unspecified: Secondary | ICD-10-CM

## 2022-08-26 DIAGNOSIS — R051 Acute cough: Secondary | ICD-10-CM

## 2022-08-26 LAB — POC COVID19 BINAXNOW: SARS Coronavirus 2 Ag: NEGATIVE

## 2022-08-26 MED ORDER — AMOXICILLIN 875 MG PO TABS: 875.0000 mg | ORAL_TABLET | Freq: Two times a day (BID) | ORAL | 0 refills | Status: AC

## 2022-08-26 NOTE — Patient Instructions (Signed)
Your COVID test is negative.  Continue treating your symptoms.  I recommend taking an anti-inflammatory such as meloxicam since you have this at home.  You can do salt water gargles for sore throat.  Use throat lozenges and stay well-hydrated.  Mucinex dm or Robitussin DM for cough and congestion.  If you are getting worse or not improving in the next 24 to 48 hours, you can start the antibiotic and complete it if you do need to start it.

## 2022-08-26 NOTE — Progress Notes (Signed)
Subjective:  Edward [name] is a 47 y.o. male who presents for a 5-7 day hx of hoarseness, ST, cough, right earache.   States he had a colonoscopy yesterday. Hx of colon cancer  Denies fever, chills, dizziness, chest pain, palpitations, shortness of breath, abdominal pain, vomiting or diarrhea.  Treatment to date: zinc, Tylenol PM, tea  No other aggravating or relieving factors.  No other c/o.  ROS as in subjective.   Objective: Vitals:   08/26/22 0952  BP: 130/82  Pulse: 78  Temp: 97.8 F (36.6 C)  SpO2: 98%    General appearance: Alert, WD/WN, no distress                             Skin: warm, no rash                           Head: no sinus tenderness                            Eyes: conjunctiva normal, corneas clear, PERRLA                            Ears: Right TM with mild erythema and dull, left TM normal with good light reflex., external ear canals normal                          Nose: septum midline, no drainage             Mouth/throat: MMM, tongue normal, mild pharyngeal erythema                           Neck: supple, no adenopathy, no thyromegaly, nontender                          Heart: RRR, normal S1, S2, no murmurs                         Lungs: CTA bilaterally, no wheezes, rales, or rhonchi      Assessment: Acute pharyngitis, unspecified etiology  Acute cough - Plan: POC COVID-19 BinaxNow  Otalgia of right ear   Plan: Negative COVID test Possible early right otitis media.  Amoxicillin prescribed.  If he is not improving in the next 24 to 48 hours, he may start the antibiotic.   Suggested symptomatic OTC remedies.   Tylenol or Ibuprofen prn, he has meloxicam at home.

## 2022-08-26 NOTE — Telephone Encounter (Signed)
Attempted to call patient for their post-procedure follow-up call. No answer. Left voicemail.   

## 2022-08-30 ENCOUNTER — Encounter: Payer: Self-pay | Admitting: Family Medicine

## 2022-08-31 ENCOUNTER — Other Ambulatory Visit: Payer: Self-pay | Admitting: Family Medicine

## 2022-08-31 DIAGNOSIS — M5441 Lumbago with sciatica, right side: Secondary | ICD-10-CM

## 2022-09-07 ENCOUNTER — Ambulatory Visit (INDEPENDENT_AMBULATORY_CARE_PROVIDER_SITE_OTHER): Payer: BC Managed Care – PPO | Admitting: Physical Therapy

## 2022-09-07 ENCOUNTER — Encounter: Payer: Self-pay | Admitting: Physical Therapy

## 2022-09-07 DIAGNOSIS — M5459 Other low back pain: Secondary | ICD-10-CM

## 2022-09-07 NOTE — Therapy (Signed)
OUTPATIENT PHYSICAL THERAPY EVALUATION   Patient Name: Nils Thor MRN: [id number] DOB:1975/03/30, 47 y.o., male Today's Date: 09/07/2022  END OF SESSION:  PT End of Session - 09/07/22 0900     Visit Number 1    Number of Visits 16    Date for PT Re-Evaluation 11/02/22    Authorization Type BCBS    PT Start Time 0805    PT Stop Time 0846    PT Time Calculation (min) 41 min    Activity Tolerance Patient tolerated treatment well    Behavior During Therapy WFL for tasks assessed/performed             Past Medical History:  Diagnosis Date   Arthritis    OA right knee   Family history of prostate cancer    Kidney stones    has a horse shoe kidney   Rectal cancer (HCC)    Thyroid disease    History reviewed. No pertinent surgical history. Patient Active Problem List   Diagnosis Date Noted   Right wrist pain 07/28/2022   Midline back pain 06/08/2022   Right carpal tunnel syndrome 04/25/2022   Adjustment disorder 11/04/2021   Skin mole 08/11/2021   Extensor carpi ulnaris tendinitis 06/01/2021   Routine general medical examination at a health care facility 01/05/2021   Genetic testing 12/28/2020   [facility] joint pain 11/24/2020   Rectal cancer (HCC) 09/22/2020   Insomnia 09/22/2020   Hypothyroid 09/22/2020   Nonallopathic lesion of thoracic region 11/25/2018   Nonallopathic lesion of sacral region 11/25/2018   Nonallopathic lesion of lumbosacral region 11/25/2018   Nonallopathic lesion of rib cage 11/25/2018   Nonallopathic lesion of cervical region 11/25/2018   Trigger point of left shoulder region 08/29/2018   Patellofemoral arthralgia of right knee 07/25/2018   Scapular dyskinesis 07/25/2018     PCP: Hillard Danker  REFERRING PROVIDER:  Terrilee Files   REFERRING DIAG: Low back pain  THERAPY DIAG:  Other low back pain  Rationale for Evaluation and Treatment: Rehabilitation  ONSET DATE:    SUBJECTIVE:   SUBJECTIVE STATEMENT:  Pt states new onset of  back pain. Reports that it Could have started maybe about 2 years ago mildly.  After surgery this past March(3/24) he noticed increased pain .  Notes that Sitting too long causes  increased numbness in legs, especially when sitting on toilet.  Standing/walking worst pain.  Back: sore/pain  up to 8/10 at times. Was quite bad about 1 week ago. Denies any radicular pain now or in the past few months.  May/June, had injections, did have some relief after 2nd one.  Other:  R torn tendon in wrist Working full time  [employer], not doing at this time.  Has just finished cancer treatment with 2 surgeries. Feeling good, eating, sleeping well, Feels tired, some fatigue, not back to regular energy or activity yet.    PERTINENT HISTORY: Recent rectal cancer, surgery x 2 and chemo.   PAIN:  Are you having pain? Yes: NPRS scale: 6-7 /10 Pain location: low back/L5/S1/Central  Pain description: Sore, stiff  Aggravating factors: sitting, standing  Relieving factors: none stated   PRECAUTIONS: None  WEIGHT BEARING RESTRICTIONS: No  FALLS:  Has patient fallen in last 6 months? No  PLOF: Independent  PATIENT GOALS:  Decreased pain   NEXT MD VISIT:   OBJECTIVE:   DIAGNOSTIC FINDINGS:   IMPRESSION: Mild disc and facet degeneration in the mid and lower lumbar spine with up to mild lateral recess  and neural foraminal stenosis as above.    PATIENT SURVEYS:  Need FOTO    COGNITION: Overall cognitive status: Within functional limits for tasks assessed     SENSATION: WFL  POSTURE:    No Significant postural limitations  PALPATION:  Tenderness in central lumbar spine, increased at L4/5,  No pain in SIJ, Minimal pain into glutes,  Tightness in bil HS  L>R; Tightness in bil thoracic paraspinals.   LOWER EXTREMITY ROM:  Lumbar: Flex: WFL,  Ext: mild limitation (pain) ,  SB: WFL bil;   Active ROM Left eval Right eval  Hip flexion wfl Mild limitation/pain anteriorly  Hip extension  wfl wfl  Hip abduction    Hip adduction    Hip internal rotation    Hip external rotation wfl Mild limitation  Knee flexion    Knee extension    Ankle dorsiflexion    Ankle plantarflexion    Ankle inversion    Ankle eversion     (Blank rows = not tested)  LOWER EXTREMITY MMT:  MMT Left eval Right  eval  Hip flexion 4+ 4+  Hip extension    Hip abduction 4+ 4+  Hip adduction    Hip internal rotation    Hip external rotation    Knee flexion 5 5  Knee extension 5 5  Ankle dorsiflexion    Ankle plantarflexion    Ankle inversion    Ankle eversion     (Blank rows = not tested)  LOWER EXTREMITY SPECIAL TESTS:  Neg SLR today   GAIT: unremarkable    TODAY'S TREATMENT:                                                                                                                              DATE:  09/07/2022 Ther ex: See below for HEP Review of hip hinge motion for sit to stand x 5;    PATIENT EDUCATION:  Education details: PT POC, Exam findings, HEP Person educated: Patient Education method: Explanation, Demonstration, Tactile cues, Verbal cues, and Handouts Education comprehension: verbalized understanding, returned demonstration, verbal cues required, tactile cues required, and needs further education   HOME EXERCISE PROGRAM: Access Code: [email or web address] URL: [email or web address] Date: 09/07/2022 Prepared by: Sedalia Muta  Exercises - Supine Posterior Pelvic Tilt  - 1-2 x daily - 1-2 sets - 10 reps - Supine Lower Trunk Rotation  - 1-2 x daily - 10 reps - 5 hold - Supine Piriformis Stretch Pulling Heel to Hip  - 2 x daily - 3 reps - 30 hold - Supine Transversus Abdominis Bracing - Hands on Ground  - 1 x daily - 1- 2 sets - 10 reps  ASSESSMENT:  CLINICAL IMPRESSION: Patient presents with primary complaint of increased pain in low back.  He has most pain in central low lumbar region. Pt with poor movement quality and ability for functional activity  due to pain and fear of pain. He has decreased ability for bending, sitting,  as well as standing and IADLS.  Pt will benefit from education on HEP for management and progressing to pain free increase in physical activity/exercise.  Pt will  benefit from skilled PT to improve deficits and pain and to return to PLOF.   OBJECTIVE IMPAIRMENTS: decreased activity tolerance, decreased mobility, decreased ROM, decreased strength, increased muscle spasms, improper body mechanics, and pain.   ACTIVITY LIMITATIONS: bending, sitting, standing, squatting, stairs, transfers, hygiene/grooming, and locomotion level  PARTICIPATION LIMITATIONS: cleaning, laundry, shopping, community activity, occupation, and yard work  PERSONAL FACTORS: Time since onset of injury/illness/exacerbation are also affecting patient's functional outcome.   REHAB POTENTIAL: Good  CLINICAL DECISION MAKING: Stable/uncomplicated  EVALUATION COMPLEXITY: Low   GOALS: Goals reviewed with patient? Yes  SHORT TERM GOALS: Target date: 09/21/2022   Pt to be independent with initial HEP  Goal status: INITIAL    LONG TERM GOALS: Target date: 11/02/2022   Pt to be independent with final HEP  Goal status: INITIAL  2.  Pt to demo pain free lumbar ROM to be [facility]  Goal status: INITIAL  3.  Pt to demo ability for functional bend, lift, squat with optimal mechanics, to improve ability and back pain with IADLs.   Goal status: INITIAL  4.  Pt to report return to exercise for at least 30 min with pain 0-1/10 in low back.   Goal status: INITIAL    PLAN:  PT FREQUENCY: 1-2x/week  PT DURATION: 8 weeks  PLANNED INTERVENTIONS: Therapeutic exercises, Therapeutic activity, Neuromuscular re-education, Patient/Family education, Self Care, Joint mobilization, Joint manipulation, Stair training, Orthotic/Fit training, DME instructions, Aquatic Therapy, Dry Needling, Electrical stimulation, Cryotherapy, Moist heat, Taping, Ultrasound,  Ionotophoresis 4mg /ml Dexamethasone, Manual therapy,  Vasopneumatic device, Traction, Spinal manipulation, Spinal mobilization,Balance training, Gait training,   PLAN FOR NEXT SESSION:   Lumbar decompression, gentile lumbar mobility, work into extension when comfortable, hip stretches, TA and core strength, progress to functional strength.    Sedalia Muta, PT, DPT 9:09 AM  09/07/22

## 2022-09-09 ENCOUNTER — Encounter: Payer: Self-pay | Admitting: Gastroenterology

## 2022-09-19 ENCOUNTER — Encounter: Payer: BC Managed Care – PPO | Admitting: Physical Therapy

## 2022-09-22 ENCOUNTER — Encounter: Payer: Self-pay | Admitting: Physical Therapy

## 2022-09-22 ENCOUNTER — Ambulatory Visit (INDEPENDENT_AMBULATORY_CARE_PROVIDER_SITE_OTHER): Payer: BC Managed Care – PPO | Admitting: Physical Therapy

## 2022-09-22 DIAGNOSIS — M5459 Other low back pain: Secondary | ICD-10-CM | POA: Diagnosis not present

## 2022-09-22 NOTE — Therapy (Signed)
OUTPATIENT PHYSICAL THERAPY TREATMENT   Patient Name: Camille Shillito MRN: [id number] DOB:06-08-1975, 47 y.o., male Today's Date: 09/22/2022  END OF SESSION:  PT End of Session - 09/22/22 0941     Visit Number 2    Number of Visits 16    Date for PT Re-Evaluation 11/02/22    Authorization Type BCBS    PT Start Time [phone]    PT Stop Time 0931    PT Time Calculation (min) 39 min    Activity Tolerance Patient tolerated treatment well    Behavior During Therapy WFL for tasks assessed/performed              Past Medical History:  Diagnosis Date   Arthritis    OA right knee   Family history of prostate cancer    Kidney stones    has a horse shoe kidney   Rectal cancer (HCC)    Thyroid disease    History reviewed. No pertinent surgical history. Patient Active Problem List   Diagnosis Date Noted   Right wrist pain 07/28/2022   Midline back pain 06/08/2022   Right carpal tunnel syndrome 04/25/2022   Adjustment disorder 11/04/2021   Skin mole 08/11/2021   Extensor carpi ulnaris tendinitis 06/01/2021   Routine general medical examination at a health care facility 01/05/2021   Genetic testing 12/28/2020   [facility] joint pain 11/24/2020   Rectal cancer (HCC) 09/22/2020   Insomnia 09/22/2020   Hypothyroid 09/22/2020   Nonallopathic lesion of thoracic region 11/25/2018   Nonallopathic lesion of sacral region 11/25/2018   Nonallopathic lesion of lumbosacral region 11/25/2018   Nonallopathic lesion of rib cage 11/25/2018   Nonallopathic lesion of cervical region 11/25/2018   Trigger point of left shoulder region 08/29/2018   Patellofemoral arthralgia of right knee 07/25/2018   Scapular dyskinesis 07/25/2018     PCP: Hillard Danker  REFERRING PROVIDER:  Terrilee Files   REFERRING DIAG: Low back pain  THERAPY DIAG:  Other low back pain  Rationale for Evaluation and Treatment: Rehabilitation  ONSET DATE:    SUBJECTIVE:   SUBJECTIVE STATEMENT: Pt states ongoing  soreness in low back. Has been doing HEP. Not getting much relief from exercises.   Eval: Pt states new onset of back pain. Reports that it Could have started maybe about 2 years ago mildly.  After surgery this past March(3/24) he noticed increased pain .  Notes that Sitting too long causes  increased numbness in legs, especially when sitting on toilet.  Standing/walking worst pain.  Back: sore/pain  up to 8/10 at times. Was quite bad about 1 week ago. Denies any radicular pain now or in the past few months.  May/June, had injections, did have some relief after 2nd one.  Other:  R torn tendon in wrist Working full time  [employer], not doing at this time.  Has just finished cancer treatment with 2 surgeries. Feeling good, eating, sleeping well, Feels tired, some fatigue, not back to regular energy or activity yet.    PERTINENT HISTORY: Recent rectal cancer, surgery x 2 and chemo.   PAIN:  Are you having pain? Yes: NPRS scale: 6-7 /10 Pain location: low back/L5/S1/Central  Pain description: Sore, stiff  Aggravating factors: sitting, standing  Relieving factors: none stated   PRECAUTIONS: None  WEIGHT BEARING RESTRICTIONS: No  FALLS:  Has patient fallen in last 6 months? No  PLOF: Independent  PATIENT GOALS:  Decreased pain   NEXT MD VISIT:   OBJECTIVE:   DIAGNOSTIC FINDINGS:  IMPRESSION: Mild disc and facet degeneration in the mid and lower lumbar spine with up to mild lateral recess and neural foraminal stenosis as above.    PATIENT SURVEYS:  Need FOTO    COGNITION: Overall cognitive status: Within functional limits for tasks assessed     SENSATION: WFL  POSTURE:    No Significant postural limitations  PALPATION:  Tenderness in central lumbar spine, increased at L4/5,  No pain in SIJ, Minimal pain into glutes,  Tightness in bil HS  L>R; Tightness in bil thoracic paraspinals.   LOWER EXTREMITY ROM:  Lumbar: Flex: WFL,  Ext: mild limitation (pain) ,  SB:  WFL bil;   Active ROM Left eval Right eval  Hip flexion wfl Mild limitation/pain anteriorly  Hip extension wfl wfl  Hip abduction    Hip adduction    Hip internal rotation    Hip external rotation wfl Mild limitation  Knee flexion    Knee extension    Ankle dorsiflexion    Ankle plantarflexion    Ankle inversion    Ankle eversion     (Blank rows = not tested)  LOWER EXTREMITY MMT:  MMT Left eval Right  eval  Hip flexion 4+ 4+  Hip extension    Hip abduction 4+ 4+  Hip adduction    Hip internal rotation    Hip external rotation    Knee flexion 5 5  Knee extension 5 5  Ankle dorsiflexion    Ankle plantarflexion    Ankle inversion    Ankle eversion     (Blank rows = not tested)  LOWER EXTREMITY SPECIAL TESTS:  Neg SLR today   GAIT: unremarkable    TODAY'S TREATMENT:                                                                                                                              DATE:   09/22/2022  Therapeutic Exercise: Aerobic: Supine: Supine march with TA 2 x 10;  TA review x 5;  bridging 2 x 10 ;   Plank- elbows 30 sec x 3;   prone on elbows (only mild extension) x 1 min - not comfortable.  Seated: education on optimal seated posture, and not arching back, in chair, and on [place] Sit to stand, higher mat table, x 10, with education on back posture and TA  Standing: Stretches:  LTR x 10;  Piriformis 30 sec x 3 bil; pelvic tilts x 15;  Neuromuscular Re-education: Manual Therapy: long leg distraction bil, for lumbar pump  Therapeutic Activity: Self Care:   Ther ex: See below for HEP Review of hip hinge motion for sit to stand x 5;    PATIENT EDUCATION:  Education details: reviewed HEP Person educated: Patient Education method: Explanation, Demonstration, Tactile cues, Verbal cues, and Handouts Education comprehension: verbalized understanding, returned demonstration, verbal cues required, tactile cues required, and needs further  education   HOME EXERCISE PROGRAM: Access Code: [email or web address] URL: [email or web address]  Date: 09/07/2022 Prepared by: Sedalia Muta  Exercises - Supine Posterior Pelvic Tilt  - 1-2 x daily - 1-2 sets - 10 reps - Supine Lower Trunk Rotation  - 1-2 x daily - 10 reps - 5 hold - Supine Piriformis Stretch Pulling Heel to Hip  - 2 x daily - 3 reps - 30 hold - Supine Transversus Abdominis Bracing - Hands on Ground  - 1 x daily - 1- 2 sets - 10 reps  ASSESSMENT:  CLINICAL IMPRESSION: Reviewed HEP today, and progressed ther ex for light strengthening and core stability. Education for core activation with sit to stand, with improved mechanics. Reviewed optimal seated posture, pt with tendency for sitting with anterior pelvic tilt. Plan to progress mobility and strength as needed.   Eval: Patient presents with primary complaint of increased pain in low back.  He has most pain in central low lumbar region. Pt with poor movement quality and ability for functional activity due to pain and fear of pain. He has decreased ability for bending, sitting, as well as standing and IADLS.  Pt will benefit from education on HEP for management and progressing to pain free increase in physical activity/exercise.  Pt will  benefit from skilled PT to improve deficits and pain and to return to PLOF.   OBJECTIVE IMPAIRMENTS: decreased activity tolerance, decreased mobility, decreased ROM, decreased strength, increased muscle spasms, improper body mechanics, and pain.   ACTIVITY LIMITATIONS: bending, sitting, standing, squatting, stairs, transfers, hygiene/grooming, and locomotion level  PARTICIPATION LIMITATIONS: cleaning, laundry, shopping, community activity, occupation, and yard work  PERSONAL FACTORS: Time since onset of injury/illness/exacerbation are also affecting patient's functional outcome.   REHAB POTENTIAL: Good  CLINICAL DECISION MAKING: Stable/uncomplicated  EVALUATION COMPLEXITY:  Low   GOALS: Goals reviewed with patient? Yes  SHORT TERM GOALS: Target date: 09/21/2022   Pt to be independent with initial HEP  Goal status: INITIAL    LONG TERM GOALS: Target date: 11/02/2022   Pt to be independent with final HEP  Goal status: INITIAL  2.  Pt to demo pain free lumbar ROM to be [facility]  Goal status: INITIAL  3.  Pt to demo ability for functional bend, lift, squat with optimal mechanics, to improve ability and back pain with IADLs.   Goal status: INITIAL  4.  Pt to report return to exercise for at least 30 min with pain 0-1/10 in low back.   Goal status: INITIAL    PLAN:  PT FREQUENCY: 1-2x/week  PT DURATION: 8 weeks  PLANNED INTERVENTIONS: Therapeutic exercises, Therapeutic activity, Neuromuscular re-education, Patient/Family education, Self Care, Joint mobilization, Joint manipulation, Stair training, Orthotic/Fit training, DME instructions, Aquatic Therapy, Dry Needling, Electrical stimulation, Cryotherapy, Moist heat, Taping, Ultrasound, Ionotophoresis 4mg /ml Dexamethasone, Manual therapy,  Vasopneumatic device, Traction, Spinal manipulation, Spinal mobilization,Balance training, Gait training,   PLAN FOR NEXT SESSION:   Lumbar decompression, gentile lumbar mobility, work into extension when comfortable, hip stretches, TA and core strength, progress to functional strength.    Sedalia Muta, PT, DPT 9:42 AM  09/22/22

## 2022-09-26 NOTE — Progress Notes (Unsigned)
[name] [address] [place] [address] Phone: [phone] Subjective:   [name], am serving as a scribe for Dr. Antoine Primas.  I'm seeing this patient by the request  of:  Myrlene Broker, MD  CC: Follow-up after PRP  [id number]  08/17/2022 PRP given, post PRP instructions given. Discussed potential bracing for the next 10 days.  Follow-up with me again in 6 to 8 weeks     Update 09/27/2022 Edward [name] is a 47 y.o. male coming in with complaint of R wrist pain.  Patient has been in physical therapy.  Been treating the wrist as well as the lower back.  Patient states that his wrist is still sore.       Past Medical History:  Diagnosis Date   Arthritis    OA right knee   Family history of prostate cancer    Kidney stones    has a horse shoe kidney   Rectal cancer (HCC)    Thyroid disease    No past surgical history on file. Social History   Socioeconomic History   Marital status: Married    Spouse name: Not on file   Number of children: Not on file   Years of education: Not on file   Highest education level: Not on file  Occupational History   Not on file  Tobacco Use   Smoking status: Former   Smokeless tobacco: Never  Vaping Use   Vaping status: Never Used  Substance and Sexual Activity   Alcohol use: Yes    Comment: glass of wine at [facility]   Drug use: Never   Sexual activity: Not on file  Other Topics Concern   Not on file  Social History Narrative   Not on file   Social Determinants of Health   Financial Resource Strain: Patient Declined (08/25/2022)   Overall Financial Resource Strain (CARDIA)    Difficulty of Paying Living Expenses: Patient declined  Food Insecurity: Patient Declined (08/25/2022)   Hunger Vital Sign    Worried About Running Out of Food in the Last Year: Patient declined    Ran Out of Food in the Last Year: Patient declined  Transportation Needs: Patient Declined (08/25/2022)    PRAPARE - Administrator, Civil Service (Medical): Patient declined    Lack of Transportation (Non-Medical): Patient declined  Physical Activity: Unknown (08/25/2022)   Exercise Vital Sign    Days of Exercise per Week: Patient declined    Minutes of Exercise per Session: Not on file  Stress: Stress Concern Present (08/25/2022)   [employer] of Occupational Health - Occupational Stress Questionnaire    Feeling of Stress : To some extent  Social Connections: Unknown (08/25/2022)   Social Connection and Isolation Panel [NHANES]    Frequency of Communication with Friends and Family: Patient declined    Frequency of Social Gatherings with Friends and Family: Patient declined    Attends Religious Services: Patient declined    Database administrator or Organizations: Patient declined    Attends Engineer, structural: Not on file    Marital Status: Patient declined   No Known Allergies Family History  Problem Relation Age of Onset   Depression Mother    Asthma Father    Cancer Father    CAD Father    Hyperlipidemia Father    Hypertension Father    Colon cancer Neg Hx    Esophageal cancer Neg Hx  Rectal cancer Neg Hx    Stomach cancer Neg Hx     Current Outpatient Medications (Endocrine & Metabolic):    levothyroxine (SYNTHROID) 75 MCG tablet, TAKE 1 TABLET(75 MCG) BY MOUTH DAILY BEFORE BREAKFAST    Current Outpatient Medications (Analgesics):    meloxicam (MOBIC) 15 MG tablet, Take 1 tablet (15 mg total) by mouth daily.  Current Outpatient Medications (Hematological):    vitamin B-12 (CYANOCOBALAMIN) 1000 MCG tablet, Take 1,000 mcg by mouth daily.  Current Outpatient Medications (Other):    cholecalciferol (VITAMIN D3) 25 MCG (1000 UT) tablet, Take 2,000 Units by mouth 2 (two) times a day.   DULoxetine (CYMBALTA) 20 MG capsule, TAKE 1 CAPSULE(20 MG) BY MOUTH TWICE DAILY   hydrocortisone (ANUSOL-HC) 2.5 % rectal cream, Place 1 Application rectally 2  (two) times daily.   traZODone (DESYREL) 50 MG tablet, TAKE 1/2 TO 1 TABLET(25 TO 50 MG) BY MOUTH AT BEDTIME AS NEEDED FOR SLEEP   Reviewed prior external information including notes and imaging from  primary care provider As well as notes that were available from care everywhere and other healthcare systems.  Past medical history, social, surgical and family history all reviewed in electronic medical record.  No pertanent information unless stated regarding to the chief complaint.   Review of Systems:  No headache, visual changes, nausea, vomiting, diarrhea, constipation, dizziness, abdominal pain, skin rash, fevers, chills, night sweats, weight loss, swollen lymph nodes, body aches, joint swelling, chest pain, shortness of breath, mood changes. POSITIVE muscle aches  Objective  There were no vitals taken for this visit.   General: No apparent distress alert and oriented x3 mood and affect normal, dressed appropriately.  HEENT: Pupils equal, extraocular movements intact  Respiratory: Patient's speak in full sentences and does not appear short of breath  Cardiovascular: No lower extremity edema, non tender, no erythema  Wrist exam shows good range of motion noted.  Patient does still have some tenderness to palpation over dorsal aspect of the wrist.  Patient has good grip strength though.  No significant swelling.  Low back exam shows does have some loss lordosis noted.  Some tightness noted in the paraspinal musculature.  More tightness with trigger points noted around the left shoulder blade area.  This is in the trapezius, rhomboid and latissimus dorsi.  Limited muscular skeletal ultrasound was performed and interpreted by Antoine Primas, M  Limited ultrasound of patient's wrist does show some mild hypoechoic changes but did have some what appears to be scar tissue formation and the split tear of the extensor carpi ulnaris was.  Seems to be healing appropriately. Impression: Interval  improvement  Osteopathic findings C2 flexed rotated and side bent left C4 flexed rotated and side bent left C6 flexed rotated and side bent left T3 extended rotated and side bent left inhaled third rib T9 extended rotated and side bent left L2 flexed rotated and side bent right Sacrum right on right  After verbal consent patient was prepped with alcohol swab and with a 25-gauge 1 inch needle patient was injected in 3 distinct trigger points in the left shoulder region including the latissimus dorsi, rhomboid and trapezius muscle.  Total of 3 cc of 0.5% Marcaine and 1 cc of Kenalog 40 mg/mL used.  No blood loss.  Band-Aid placed.  Post reconstruction injections given.   Impression and Recommendations:     The above documentation has been reviewed and is accurate and complete Judi Saa, DO

## 2022-09-27 ENCOUNTER — Encounter: Payer: Self-pay | Admitting: Family Medicine

## 2022-09-27 ENCOUNTER — Ambulatory Visit (INDEPENDENT_AMBULATORY_CARE_PROVIDER_SITE_OTHER): Payer: BC Managed Care – PPO | Admitting: Family Medicine

## 2022-09-27 ENCOUNTER — Other Ambulatory Visit: Payer: Self-pay

## 2022-09-27 VITALS — BP 118/82 | HR 85 | Ht 69.0 in | Wt 169.0 lb

## 2022-09-27 DIAGNOSIS — M9904 Segmental and somatic dysfunction of sacral region: Secondary | ICD-10-CM

## 2022-09-27 DIAGNOSIS — M5442 Lumbago with sciatica, left side: Secondary | ICD-10-CM

## 2022-09-27 DIAGNOSIS — M5441 Lumbago with sciatica, right side: Secondary | ICD-10-CM

## 2022-09-27 DIAGNOSIS — M25512 Pain in left shoulder: Secondary | ICD-10-CM | POA: Diagnosis not present

## 2022-09-27 DIAGNOSIS — M9901 Segmental and somatic dysfunction of cervical region: Secondary | ICD-10-CM

## 2022-09-27 DIAGNOSIS — M9902 Segmental and somatic dysfunction of thoracic region: Secondary | ICD-10-CM | POA: Diagnosis not present

## 2022-09-27 DIAGNOSIS — M9908 Segmental and somatic dysfunction of rib cage: Secondary | ICD-10-CM

## 2022-09-27 DIAGNOSIS — M9903 Segmental and somatic dysfunction of lumbar region: Secondary | ICD-10-CM

## 2022-09-27 DIAGNOSIS — M778 Other enthesopathies, not elsewhere classified: Secondary | ICD-10-CM

## 2022-09-27 DIAGNOSIS — M25531 Pain in right wrist: Secondary | ICD-10-CM | POA: Diagnosis not present

## 2022-09-27 NOTE — Assessment & Plan Note (Signed)
Wrist exam does look like and is making improvement.  I do see actually some scar tissue formation noted.  Some even new tendon aspect noted.  Still some mild surrounding hypoechoic changes.  Patient will continue with the home exercises.  Still very hopeful that the PRP will make significant improvement over the course of time.  Follow-up with me again in 6 to 8 weeks otherwise

## 2022-09-27 NOTE — Assessment & Plan Note (Signed)
Repeat injections given today, tolerated the procedure well, discussed icing regimen and home exercises, discussed which activities to do and which ones to avoid.  Still think that some of it could be more cervical radiculopathy.  Has had significant difficulty with all his other health and could be potentially contributing.  Follow-up again in 6 to 8 weeks.

## 2022-09-27 NOTE — Patient Instructions (Addendum)
Trigger point injections  [facility] [phone]  See you again in 2-3 months

## 2022-09-27 NOTE — Assessment & Plan Note (Signed)
Midline low back pain repeat injection at this time.

## 2022-10-05 ENCOUNTER — Ambulatory Visit (INDEPENDENT_AMBULATORY_CARE_PROVIDER_SITE_OTHER): Payer: BC Managed Care – PPO | Admitting: Physical Therapy

## 2022-10-05 DIAGNOSIS — M5459 Other low back pain: Secondary | ICD-10-CM

## 2022-10-05 NOTE — Therapy (Signed)
OUTPATIENT PHYSICAL THERAPY TREATMENT   Patient Name: Edward [name] MRN: [id number] DOB:[birth year], 47 y.o., male Today's Date: 10/05/2022  END OF SESSION:  PT End of Session - 10/07/22 1557     Visit Number 3    Number of Visits 16    Date for PT Re-Evaluation 11/02/22    Authorization Type BCBS    PT Start Time 0806    PT Stop Time 0845    PT Time Calculation (min) 39 min    Activity Tolerance Patient tolerated treatment well    Behavior During Therapy WFL for tasks assessed/performed               Past Medical History:  Diagnosis Date   Arthritis    OA right knee   Family history of prostate cancer    Kidney stones    has a horse shoe kidney   Rectal cancer (HCC)    Thyroid disease    History reviewed. No pertinent surgical history. Patient Active Problem List   Diagnosis Date Noted   Right wrist pain 07/28/2022   Midline back pain 06/08/2022   Right carpal tunnel syndrome 04/25/2022   Adjustment disorder 11/04/2021   Skin mole 08/11/2021   Extensor carpi ulnaris tendinitis 06/01/2021   Routine general medical examination at a health care facility 01/05/2021   Genetic testing 12/28/2020   [facility] joint pain 11/24/2020   Rectal cancer (HCC) 09/22/2020   Insomnia 09/22/2020   Hypothyroid 09/22/2020   Nonallopathic lesion of thoracic region 11/25/2018   Nonallopathic lesion of sacral region 11/25/2018   Nonallopathic lesion of lumbosacral region 11/25/2018   Nonallopathic lesion of rib cage 11/25/2018   Nonallopathic lesion of cervical region 11/25/2018   Trigger point of left shoulder region 08/29/2018   Patellofemoral arthralgia of right knee 07/25/2018   Scapular dyskinesis 07/25/2018     PCP: Hillard Danker  REFERRING PROVIDER:  Terrilee Files   REFERRING DIAG: Low back pain  THERAPY DIAG:  Other low back pain  Rationale for Evaluation and Treatment: Rehabilitation  ONSET DATE:    SUBJECTIVE:   SUBJECTIVE STATEMENT: Pt states back is  constantly sore. He did ride in car for a few weeks, and reports no tingling into leg.    Eval: Pt states new onset of back pain. Reports that it Could have started maybe about 2 years ago mildly.  After surgery this past March(3/24) he noticed increased pain .  Notes that Sitting too long causes  increased numbness in legs, especially when sitting on toilet.  Standing/walking worst pain.  Back: sore/pain  up to 8/10 at times. Was quite bad about 1 week ago. Denies any radicular pain now or in the past few months.  May/June, had injections, did have some relief after 2nd one.  Other:  R torn tendon in wrist Working full time  [employer], not doing at this time.  Has just finished cancer treatment with 2 surgeries. Feeling good, eating, sleeping well, Feels tired, some fatigue, not back to regular energy or activity yet.    PERTINENT HISTORY: Recent rectal cancer, surgery x 2 and chemo.   PAIN:  Are you having pain? Yes: NPRS scale: 6-7 /10 Pain location: low back/L5/S1/Central  Pain description: Sore, stiff  Aggravating factors: sitting, standing  Relieving factors: none stated   PRECAUTIONS: None  WEIGHT BEARING RESTRICTIONS: No  FALLS:  Has patient fallen in last 6 months? No  PLOF: Independent  PATIENT GOALS:  Decreased pain   NEXT MD VISIT:  OBJECTIVE:   DIAGNOSTIC FINDINGS:   IMPRESSION: Mild disc and facet degeneration in the mid and lower lumbar spine with up to mild lateral recess and neural foraminal stenosis as above.   PATIENT SURVEYS:  Need FOTO   COGNITION: Overall cognitive status: Within functional limits for tasks assessed     SENSATION: WFL  POSTURE:    No Significant postural limitations  PALPATION:  Tenderness in central lumbar spine, increased at L4/5,  No pain in SIJ, Minimal pain into glutes,  Tightness in bil HS  L>R; Tightness in bil thoracic paraspinals.   LOWER EXTREMITY ROM:  Lumbar: Flex: WFL,  Ext: mild limitation (pain) ,   SB: WFL bil;   Active ROM Left eval Right eval  Hip flexion wfl Mild limitation/pain anteriorly  Hip extension wfl wfl  Hip abduction    Hip adduction    Hip internal rotation    Hip external rotation wfl Mild limitation  Knee flexion    Knee extension    Ankle dorsiflexion    Ankle plantarflexion    Ankle inversion    Ankle eversion     (Blank rows = not tested)  LOWER EXTREMITY MMT:  MMT Left eval Right  eval  Hip flexion 4+ 4+  Hip extension    Hip abduction 4+ 4+  Hip adduction    Hip internal rotation    Hip external rotation    Knee flexion 5 5  Knee extension 5 5  Ankle dorsiflexion    Ankle plantarflexion    Ankle inversion    Ankle eversion     (Blank rows = not tested)  LOWER EXTREMITY SPECIAL TESTS:  Neg SLR today   GAIT: unremarkable    TODAY'S TREATMENT:                                                                                                                              DATE:   10/07/2022 Therapeutic Exercise: Aerobic: Supine: Supine march with TA 2 x 10;;   bridging 2 x 10 ;  Seated:  wrist ext with slow lowering 2 lb x 10;  Sit to stand, higher mat table, x 10, with education on back posture and TA  Standing: Stretches:  LTR x 10;  Piriformis 30 sec x 3 bil; pelvic tilts x 15; cat cow x 15;  Neuromuscular Re-education: Manual Therapy: STM/DTM to bil low lumbar  Therapeutic Activity: Self Care:   Ther ex: See below for HEP Review of hip hinge motion for sit to stand x 5;    PATIENT EDUCATION:  Education details: reviewed HEP Person educated: Patient Education method: Explanation, Demonstration, Tactile cues, Verbal cues, and Handouts Education comprehension: verbalized understanding, returned demonstration, verbal cues required, tactile cues required, and needs further education   HOME EXERCISE PROGRAM: Access Code: [email or web address] URL: [email or web address] Date: 09/07/2022 Prepared by: Sedalia Muta  Exercises - Supine Posterior Pelvic Tilt  - 1-2 x daily - 1-2 sets - 10  reps - Supine Lower Trunk Rotation  - 1-2 x daily - 10 reps - 5 hold - Supine Piriformis Stretch Pulling Heel to Hip  - 2 x daily - 3 reps - 30 hold - Supine Transversus Abdominis Bracing - Hands on Ground  - 1 x daily - 1- 2 sets - 10 reps  ASSESSMENT:  CLINICAL IMPRESSION:  Pt has been able to progress ther ex without pain, but a had limited pain relief. Plan to progress mobility and strength as tolerated.   Eval: Patient presents with primary complaint of increased pain in low back.  He has most pain in central low lumbar region. Pt with poor movement quality and ability for functional activity due to pain and fear of pain. He has decreased ability for bending, sitting, as well as standing and IADLS.  Pt will benefit from education on HEP for management and progressing to pain free increase in physical activity/exercise.  Pt will  benefit from skilled PT to improve deficits and pain and to return to PLOF.   OBJECTIVE IMPAIRMENTS: decreased activity tolerance, decreased mobility, decreased ROM, decreased strength, increased muscle spasms, improper body mechanics, and pain.   ACTIVITY LIMITATIONS: bending, sitting, standing, squatting, stairs, transfers, hygiene/grooming, and locomotion level  PARTICIPATION LIMITATIONS: cleaning, laundry, shopping, community activity, occupation, and yard work  PERSONAL FACTORS: Time since onset of injury/illness/exacerbation are also affecting patient's functional outcome.   REHAB POTENTIAL: Good  CLINICAL DECISION MAKING: Stable/uncomplicated  EVALUATION COMPLEXITY: Low   GOALS: Goals reviewed with patient? Yes  SHORT TERM GOALS: Target date: 09/21/2022   Pt to be independent with initial HEP  Goal status: INITIAL    LONG TERM GOALS: Target date: 11/02/2022   Pt to be independent with final HEP  Goal status: INITIAL  2.  Pt to demo pain free lumbar ROM  to be [facility]  Goal status: INITIAL  3.  Pt to demo ability for functional bend, lift, squat with optimal mechanics, to improve ability and back pain with IADLs.   Goal status: INITIAL  4.  Pt to report return to exercise for at least 30 min with pain 0-1/10 in low back.   Goal status: INITIAL    PLAN:  PT FREQUENCY: 1-2x/week  PT DURATION: 8 weeks  PLANNED INTERVENTIONS: Therapeutic exercises, Therapeutic activity, Neuromuscular re-education, Patient/Family education, Self Care, Joint mobilization, Joint manipulation, Stair training, Orthotic/Fit training, DME instructions, Aquatic Therapy, Dry Needling, Electrical stimulation, Cryotherapy, Moist heat, Taping, Ultrasound, Ionotophoresis 4mg /ml Dexamethasone, Manual therapy,  Vasopneumatic device, Traction, Spinal manipulation, Spinal mobilization,Balance training, Gait training,   PLAN FOR NEXT SESSION:   Lumbar decompression, gentile lumbar mobility, work into extension when comfortable, hip stretches, TA and core strength, progress to functional strength.    Sedalia Muta, PT, DPT 4:02 PM  10/07/22

## 2022-10-07 ENCOUNTER — Encounter: Payer: Self-pay | Admitting: Physical Therapy

## 2022-10-10 ENCOUNTER — Ambulatory Visit (INDEPENDENT_AMBULATORY_CARE_PROVIDER_SITE_OTHER): Payer: BC Managed Care – PPO | Admitting: Physical Therapy

## 2022-10-10 ENCOUNTER — Encounter: Payer: Self-pay | Admitting: Internal Medicine

## 2022-10-10 ENCOUNTER — Encounter: Payer: Self-pay | Admitting: Physical Therapy

## 2022-10-10 DIAGNOSIS — M5459 Other low back pain: Secondary | ICD-10-CM

## 2022-10-10 DIAGNOSIS — Z139 Encounter for screening, unspecified: Secondary | ICD-10-CM

## 2022-10-10 NOTE — Therapy (Signed)
OUTPATIENT PHYSICAL THERAPY TREATMENT   Patient Name: Edward [name] MRN: [id number] DOB:09/16/1975, 47 y.o., male Today's Date: 10/05/2022  END OF SESSION:  PT End of Session - 10/10/22 0844     Visit Number 4    Number of Visits 16    Date for PT Re-Evaluation 11/02/22    Authorization Type BCBS    PT Start Time [phone]    PT Stop Time 0930    PT Time Calculation (min) 44 min    Activity Tolerance Patient tolerated treatment well    Behavior During Therapy WFL for tasks assessed/performed               Past Medical History:  Diagnosis Date   Arthritis    OA right knee   Family history of prostate cancer    Kidney stones    has a horse shoe kidney   Rectal cancer (HCC)    Thyroid disease    History reviewed. No pertinent surgical history. Patient Active Problem List   Diagnosis Date Noted   Right wrist pain 07/28/2022   Midline back pain 06/08/2022   Right carpal tunnel syndrome 04/25/2022   Adjustment disorder 11/04/2021   Skin mole 08/11/2021   Extensor carpi ulnaris tendinitis 06/01/2021   Routine general medical examination at a health care facility 01/05/2021   Genetic testing 12/28/2020   [facility] joint pain 11/24/2020   Rectal cancer (HCC) 09/22/2020   Insomnia 09/22/2020   Hypothyroid 09/22/2020   Nonallopathic lesion of thoracic region 11/25/2018   Nonallopathic lesion of sacral region 11/25/2018   Nonallopathic lesion of lumbosacral region 11/25/2018   Nonallopathic lesion of rib cage 11/25/2018   Nonallopathic lesion of cervical region 11/25/2018   Trigger point of left shoulder region 08/29/2018   Patellofemoral arthralgia of right knee 07/25/2018   Scapular dyskinesis 07/25/2018     PCP: Hillard Danker  REFERRING PROVIDER:  Terrilee Files   REFERRING DIAG: Low back pain  THERAPY DIAG:  Other low back pain  Rationale for Evaluation and Treatment: Rehabilitation  ONSET DATE:    SUBJECTIVE:   SUBJECTIVE STATEMENT: Pt states back has  been a bit better. He has been doing his HEP more. He also did quite a bit of walking yesterday and mowed lawn with little increased pain today.    Eval: Pt states new onset of back pain. Reports that it Could have started maybe about 2 years ago mildly.  After surgery this past March(3/24) he noticed increased pain .  Notes that Sitting too long causes  increased numbness in legs, especially when sitting on toilet.  Standing/walking worst pain.  Back: sore/pain  up to 8/10 at times. Was quite bad about 1 week ago. Denies any radicular pain now or in the past few months.  May/June, had injections, did have some relief after 2nd one.  Other:  R torn tendon in wrist Working full time  [employer], not doing at this time.  Has just finished cancer treatment with 2 surgeries. Feeling good, eating, sleeping well, Feels tired, some fatigue, not back to regular energy or activity yet.    PERTINENT HISTORY: Recent rectal cancer, surgery x 2 and chemo.   PAIN:  Are you having pain? Yes: NPRS scale: 4 /10 Pain location: low back/L5/S1/Central  Pain description: Sore, stiff  Aggravating factors: sitting, standing  Relieving factors: none stated   PRECAUTIONS: None  WEIGHT BEARING RESTRICTIONS: No  FALLS:  Has patient fallen in last 6 months? No  PLOF: Independent  PATIENT  GOALS:  Decreased pain   NEXT MD VISIT:   OBJECTIVE:   DIAGNOSTIC FINDINGS:   IMPRESSION: Mild disc and facet degeneration in the mid and lower lumbar spine with up to mild lateral recess and neural foraminal stenosis as above.   PATIENT SURVEYS:  Need FOTO   COGNITION: Overall cognitive status: Within functional limits for tasks assessed     SENSATION: WFL  POSTURE:    No Significant postural limitations  PALPATION:  Tenderness in central lumbar spine, increased at L4/5,  No pain in SIJ, Minimal pain into glutes,  Tightness in bil HS  L>R; Tightness in bil thoracic paraspinals.   LOWER EXTREMITY  ROM:  Lumbar: Flex: WFL,  Ext: mild limitation (pain) ,  SB: WFL bil;   Active ROM Left eval Right eval  Hip flexion wfl Mild limitation/pain anteriorly  Hip extension wfl wfl  Hip abduction    Hip adduction    Hip internal rotation    Hip external rotation wfl Mild limitation  Knee flexion    Knee extension    Ankle dorsiflexion    Ankle plantarflexion    Ankle inversion    Ankle eversion     (Blank rows = not tested)  LOWER EXTREMITY MMT:  MMT Left eval Right  eval  Hip flexion 4+ 4+  Hip extension    Hip abduction 4+ 4+  Hip adduction    Hip internal rotation    Hip external rotation    Knee flexion 5 5  Knee extension 5 5  Ankle dorsiflexion    Ankle plantarflexion    Ankle inversion    Ankle eversion     (Blank rows = not tested)  LOWER EXTREMITY SPECIAL TESTS:  Neg SLR today   GAIT: unremarkable    TODAY'S TREATMENT:                                                                                                                              DATE:   10/10/2022 Therapeutic Exercise: Aerobic: Supine: Supine march with TA 2 x 10;    bridging x 15 ;  SLR with TA 2 x 10 bil  Prone: hip ext 2 x 10 ; press ups to elbows x 12 (sore)  Seated: Standing: Stretches:  Seated HSS 30 sec x 3 bil;   LTR x 10;  Seated Piriformis 30 sec x 3 bil;  pelvic tilts x 15;  cat cow x 15;  Neuromuscular Re-education: Manual Therapy:   Therapeutic Activity: Self Care:   Therapeutic Exercise: Aerobic: Supine: Supine march with TA 2 x 10;;   bridging 2 x 10 ;  Seated:  wrist ext with slow lowering 2 lb x 10;  Sit to stand, higher mat table, x 10, with education on back posture and TA  Standing: Stretches:  LTR x 10;  Piriformis 30 sec x 3 bil; pelvic tilts x 15; cat cow x 15;  Neuromuscular Re-education: Manual Therapy: STM/DTM to  bil low lumbar  Therapeutic Activity: Self Care:    PATIENT EDUCATION:  Education details: reviewed HEP Person educated:  Patient Education method: Explanation, Demonstration, Tactile cues, Verbal cues, and Handouts Education comprehension: verbalized understanding, returned demonstration, verbal cues required, tactile cues required, and needs further education   HOME EXERCISE PROGRAM: Access Code: [email or web address]    ASSESSMENT:  CLINICAL IMPRESSION:  Pt overall reporting lower pain levels. Encouraged consistency with HEP. Progressed strength today, without increased pain. He is able to perform small range press ups into extension, but with no improved pain in extension after repeated motions. Flexion has been more comfortable for mobility.   Eval: Patient presents with primary complaint of increased pain in low back.  He has most pain in central low lumbar region. Pt with poor movement quality and ability for functional activity due to pain and fear of pain. He has decreased ability for bending, sitting, as well as standing and IADLS.  Pt will benefit from education on HEP for management and progressing to pain free increase in physical activity/exercise.  Pt will  benefit from skilled PT to improve deficits and pain and to return to PLOF.   OBJECTIVE IMPAIRMENTS: decreased activity tolerance, decreased mobility, decreased ROM, decreased strength, increased muscle spasms, improper body mechanics, and pain.   ACTIVITY LIMITATIONS: bending, sitting, standing, squatting, stairs, transfers, hygiene/grooming, and locomotion level  PARTICIPATION LIMITATIONS: cleaning, laundry, shopping, community activity, occupation, and yard work  PERSONAL FACTORS: Time since onset of injury/illness/exacerbation are also affecting patient's functional outcome.   REHAB POTENTIAL: Good  CLINICAL DECISION MAKING: Stable/uncomplicated  EVALUATION COMPLEXITY: Low   GOALS: Goals reviewed with patient? Yes  SHORT TERM GOALS: Target date: 09/21/2022   Pt to be independent with initial HEP  Goal status: INITIAL    LONG TERM  GOALS: Target date: 11/02/2022   Pt to be independent with final HEP  Goal status: INITIAL  2.  Pt to demo pain free lumbar ROM to be [facility]  Goal status: INITIAL  3.  Pt to demo ability for functional bend, lift, squat with optimal mechanics, to improve ability and back pain with IADLs.   Goal status: INITIAL  4.  Pt to report return to exercise for at least 30 min with pain 0-1/10 in low back.   Goal status: INITIAL    PLAN:  PT FREQUENCY: 1-2x/week  PT DURATION: 8 weeks  PLANNED INTERVENTIONS: Therapeutic exercises, Therapeutic activity, Neuromuscular re-education, Patient/Family education, Self Care, Joint mobilization, Joint manipulation, Stair training, Orthotic/Fit training, DME instructions, Aquatic Therapy, Dry Needling, Electrical stimulation, Cryotherapy, Moist heat, Taping, Ultrasound, Ionotophoresis 4mg /ml Dexamethasone, Manual therapy,  Vasopneumatic device, Traction, Spinal manipulation, Spinal mobilization,Balance training, Gait training,   PLAN FOR NEXT SESSION:   Lumbar decompression, gentile lumbar mobility, work into extension when comfortable, hip stretches, TA and core strength, progress to functional strength.    Sedalia Muta, PT, DPT 8:45 AM  10/10/22

## 2022-10-12 ENCOUNTER — Ambulatory Visit (INDEPENDENT_AMBULATORY_CARE_PROVIDER_SITE_OTHER): Payer: BC Managed Care – PPO | Admitting: Physical Therapy

## 2022-10-12 ENCOUNTER — Encounter: Payer: Self-pay | Admitting: Physical Therapy

## 2022-10-12 DIAGNOSIS — M5459 Other low back pain: Secondary | ICD-10-CM | POA: Diagnosis not present

## 2022-10-12 NOTE — Therapy (Signed)
OUTPATIENT PHYSICAL THERAPY TREATMENT   Patient Name: Edward [name] MRN: [id number] DOB:[birth year], 47 y.o., male Today's Date: 10/12/2022  END OF SESSION:  PT End of Session - 10/12/22 1617     Visit Number 5    Number of Visits 16    Date for PT Re-Evaluation 11/02/22    Authorization Type BCBS    PT Start Time 0848    PT Stop Time 0930    PT Time Calculation (min) 42 min    Activity Tolerance Patient tolerated treatment well    Behavior During Therapy WFL for tasks assessed/performed                Past Medical History:  Diagnosis Date   Arthritis    OA right knee   Family history of prostate cancer    Kidney stones    has a horse shoe kidney   Rectal cancer (HCC)    Thyroid disease    History reviewed. No pertinent surgical history. Patient Active Problem List   Diagnosis Date Noted   Right wrist pain 07/28/2022   Midline back pain 06/08/2022   Right carpal tunnel syndrome 04/25/2022   Adjustment disorder 11/04/2021   Skin mole 08/11/2021   Extensor carpi ulnaris tendinitis 06/01/2021   Routine general medical examination at a health care facility 01/05/2021   Genetic testing 12/28/2020   [facility] joint pain 11/24/2020   Rectal cancer (HCC) 09/22/2020   Insomnia 09/22/2020   Hypothyroid 09/22/2020   Nonallopathic lesion of thoracic region 11/25/2018   Nonallopathic lesion of sacral region 11/25/2018   Nonallopathic lesion of lumbosacral region 11/25/2018   Nonallopathic lesion of rib cage 11/25/2018   Nonallopathic lesion of cervical region 11/25/2018   Trigger point of left shoulder region 08/29/2018   Patellofemoral arthralgia of right knee 07/25/2018   Scapular dyskinesis 07/25/2018     PCP: Hillard Danker  REFERRING PROVIDER:  Terrilee Files   REFERRING DIAG: Low back pain  THERAPY DIAG:  Other low back pain  Rationale for Evaluation and Treatment: Rehabilitation  ONSET DATE:    SUBJECTIVE:   SUBJECTIVE STATEMENT: Pt states back has  been ok. His wrist has been sore. He did more activity bending, lifting some flooring this week.   Eval: Pt states new onset of back pain. Reports that it Could have started maybe about 2 years ago mildly.  After surgery this past March(3/24) he noticed increased pain .  Notes that Sitting too long causes  increased numbness in legs, especially when sitting on toilet.  Standing/walking worst pain.  Back: sore/pain  up to 8/10 at times. Was quite bad about 1 week ago. Denies any radicular pain now or in the past few months.  May/June, had injections, did have some relief after 2nd one.  Other:  R torn tendon in wrist Working full time  [employer], not doing at this time.  Has just finished cancer treatment with 2 surgeries. Feeling good, eating, sleeping well, Feels tired, some fatigue, not back to regular energy or activity yet.    PERTINENT HISTORY: Recent rectal cancer, surgery x 2 and chemo.   PAIN:  Are you having pain? Yes: NPRS scale: 4 /10 Pain location: low back/L5/S1/Central  Pain description: Sore, stiff  Aggravating factors: sitting, standing  Relieving factors: none stated   PRECAUTIONS: None  WEIGHT BEARING RESTRICTIONS: No  FALLS:  Has patient fallen in last 6 months? No  PLOF: Independent  PATIENT GOALS:  Decreased pain   NEXT MD VISIT:  OBJECTIVE:   DIAGNOSTIC FINDINGS:   IMPRESSION: Mild disc and facet degeneration in the mid and lower lumbar spine with up to mild lateral recess and neural foraminal stenosis as above.   PATIENT SURVEYS:  Need FOTO   COGNITION: Overall cognitive status: Within functional limits for tasks assessed     SENSATION: WFL  POSTURE:    No Significant postural limitations  PALPATION:  Tenderness in central lumbar spine, increased at L4/5,  No pain in SIJ, Minimal pain into glutes,  Tightness in bil HS  L>R; Tightness in bil thoracic paraspinals.   LOWER EXTREMITY ROM:  Lumbar: Flex: WFL,  Ext: mild limitation  (pain) ,  SB: WFL bil;   Active ROM Left eval Right eval  Hip flexion wfl Mild limitation/pain anteriorly  Hip extension wfl wfl  Hip abduction    Hip adduction    Hip internal rotation    Hip external rotation wfl Mild limitation  Knee flexion    Knee extension    Ankle dorsiflexion    Ankle plantarflexion    Ankle inversion    Ankle eversion     (Blank rows = not tested)  LOWER EXTREMITY MMT:  MMT Left eval Right  eval  Hip flexion 4+ 4+  Hip extension    Hip abduction 4+ 4+  Hip adduction    Hip internal rotation    Hip external rotation    Knee flexion 5 5  Knee extension 5 5  Ankle dorsiflexion    Ankle plantarflexion    Ankle inversion    Ankle eversion     (Blank rows = not tested)  LOWER EXTREMITY SPECIAL TESTS:  Neg SLR today   GAIT: unremarkable    TODAY'S TREATMENT:                                                                                                                              DATE:   10/12/2022 Therapeutic Exercise: Aerobic: Supine:   bridging x 15 ;   Prone: hip ext 2 x 10 ;  press ups to elbows x 12 (sore)  Seated: Standing:  sit to stand x 10,  squats x 10 with education on mechanics ;  Squat/pick up from 12 in height 20lb x 6, from ground x 5;  Stretches:  Seated HSS 30 sec x 3 bil;   LTR x 10;  Seated Piriformis 30 sec x 3 bil;  pelvic tilts x 15;  cat cow x 15;  Neuromuscular Re-education: Manual Therapy:   Therapeutic Activity: Self Care:   Previous:  Therapeutic Exercise: Aerobic: Supine: Supine march with TA 2 x 10;    bridging x 15 ;  SLR with TA 2 x 10 bil  Prone: hip ext 2 x 10 ; press ups to elbows x 12 (sore)  Seated: Standing: Stretches:  Seated HSS 30 sec x 3 bil;   LTR x 10;  Seated Piriformis 30 sec x 3 bil;  pelvic  tilts x 15;  cat cow x 15;  Neuromuscular Re-education: Manual Therapy:   Therapeutic Activity: Self Care:   Therapeutic Exercise: Aerobic: Supine: Supine march with TA 2 x 10;;    bridging 2 x 10 ;  Seated:  wrist ext with slow lowering 2 lb x 10;  Sit to stand, higher mat table, x 10, with education on back posture and TA  Standing: Stretches:  LTR x 10;  Piriformis 30 sec x 3 bil; pelvic tilts x 15; cat cow x 15;  Neuromuscular Re-education: Manual Therapy: STM/DTM to bil low lumbar  Therapeutic Activity: Self Care:    PATIENT EDUCATION:  Education details: reviewed HEP Person educated: Patient Education method: Programmer, multimedia, Demonstration, Tactile cues, Verbal cues, and Handouts Education comprehension: verbalized understanding, returned demonstration, verbal cues required, tactile cues required, and needs further education   HOME EXERCISE PROGRAM: Access Code: [email or web address]    ASSESSMENT:  CLINICAL IMPRESSION: Pt with improved hip hinge motion and improved squat position after education and practice today. He continues to have pain with prone on elbows or extension beyond a mild amount. He will benefit from continued strength for core and hips/glutes.   Eval: Pt overall reporting lower pain levels. Encouraged consistency with HEP. Progressed strength today, without increased pain. He is able to perform small range press ups into extension, but with no improved pain in extension after repeated motions. Flexion has been more comfortable for mobility.   Eval: Patient presents with primary complaint of increased pain in low back.  He has most pain in central low lumbar region. Pt with poor movement quality and ability for functional activity due to pain and fear of pain. He has decreased ability for bending, sitting, as well as standing and IADLS.  Pt will benefit from education on HEP for management and progressing to pain free increase in physical activity/exercise.  Pt will  benefit from skilled PT to improve deficits and pain and to return to PLOF.   OBJECTIVE IMPAIRMENTS: decreased activity tolerance, decreased mobility, decreased ROM, decreased strength,  increased muscle spasms, improper body mechanics, and pain.   ACTIVITY LIMITATIONS: bending, sitting, standing, squatting, stairs, transfers, hygiene/grooming, and locomotion level  PARTICIPATION LIMITATIONS: cleaning, laundry, shopping, community activity, occupation, and yard work  PERSONAL FACTORS: Time since onset of injury/illness/exacerbation are also affecting patient's functional outcome.   REHAB POTENTIAL: Good  CLINICAL DECISION MAKING: Stable/uncomplicated  EVALUATION COMPLEXITY: Low   GOALS: Goals reviewed with patient? Yes  SHORT TERM GOALS: Target date: 09/21/2022   Pt to be independent with initial HEP  Goal status: INITIAL    LONG TERM GOALS: Target date: 11/02/2022   Pt to be independent with final HEP  Goal status: INITIAL  2.  Pt to demo pain free lumbar ROM to be [facility]  Goal status: INITIAL  3.  Pt to demo ability for functional bend, lift, squat with optimal mechanics, to improve ability and back pain with IADLs.   Goal status: INITIAL  4.  Pt to report return to exercise for at least 30 min with pain 0-1/10 in low back.   Goal status: INITIAL    PLAN:  PT FREQUENCY: 1-2x/week  PT DURATION: 8 weeks  PLANNED INTERVENTIONS: Therapeutic exercises, Therapeutic activity, Neuromuscular re-education, Patient/Family education, Self Care, Joint mobilization, Joint manipulation, Stair training, Orthotic/Fit training, DME instructions, Aquatic Therapy, Dry Needling, Electrical stimulation, Cryotherapy, Moist heat, Taping, Ultrasound, Ionotophoresis 4mg /ml Dexamethasone, Manual therapy,  Vasopneumatic device, Traction, Spinal manipulation, Spinal mobilization,Balance training, Gait training,   PLAN FOR  NEXT SESSION:   Lumbar decompression, gentile lumbar mobility, work into extension when comfortable, hip stretches, TA and core strength, progress to functional strength.    Sedalia Muta, PT, DPT 4:19 PM  10/12/22

## 2022-10-19 ENCOUNTER — Encounter: Payer: Self-pay | Admitting: Physical Therapy

## 2022-10-19 ENCOUNTER — Ambulatory Visit (INDEPENDENT_AMBULATORY_CARE_PROVIDER_SITE_OTHER): Payer: BC Managed Care – PPO | Admitting: Physical Therapy

## 2022-10-19 DIAGNOSIS — M5459 Other low back pain: Secondary | ICD-10-CM | POA: Diagnosis not present

## 2022-10-19 NOTE — Therapy (Signed)
OUTPATIENT PHYSICAL THERAPY TREATMENT   Patient Name: Edward [name] MRN: [id number] DOB:06/13/1975, 47 y.o., male Today's Date: 10/19/2022   END OF SESSION:  PT End of Session - 10/19/22 0910     Visit Number 6    Number of Visits 16    Date for PT Re-Evaluation 11/02/22    Authorization Type [employer]BS    PT Start Time 0803    PT Stop Time 0848    PT Time Calculation (min) 45 min    Activity Tolerance Patient tolerated treatment well    Behavior During Therapy WFL for tasks assessed/performed                 Past Medical History:  Diagnosis Date   Arthritis    OA right knee   Family history of prostate cancer    Kidney stones    has a horse shoe kidney   Rectal cancer (HCC)    Thyroid disease    History reviewed. No pertinent surgical history. Patient Active Problem List   Diagnosis Date Noted   Right wrist pain 07/28/2022   Midline back pain 06/08/2022   Right carpal tunnel syndrome 04/25/2022   Adjustment disorder 11/04/2021   Skin mole 08/11/2021   Extensor carpi ulnaris tendinitis 06/01/2021   Routine general medical examination at a health care facility 01/05/2021   Genetic testing 12/28/2020   [facility] joint pain 11/24/2020   Rectal cancer (HCC) 09/22/2020   Insomnia 09/22/2020   Hypothyroid 09/22/2020   Nonallopathic lesion of thoracic region 11/25/2018   Nonallopathic lesion of sacral region 11/25/2018   Nonallopathic lesion of lumbosacral region 11/25/2018   Nonallopathic lesion of rib cage 11/25/2018   Nonallopathic lesion of cervical region 11/25/2018   Trigger point of left shoulder region 08/29/2018   Patellofemoral arthralgia of right knee 07/25/2018   Scapular dyskinesis 07/25/2018     PCP: Hillard Danker  REFERRING PROVIDER:  Terrilee Files   REFERRING DIAG: Low back pain  THERAPY DIAG:  Other low back pain  Rationale for Evaluation and Treatment: Rehabilitation  ONSET DATE:    SUBJECTIVE:   SUBJECTIVE STATEMENT: Pt states back  has been ok. Has still been consistently sore. Was a bit sore after a lot of walking over the weekend.   Eval: Pt states new onset of back pain. Reports that it Could have started maybe about 2 years ago mildly.  After surgery this past March(3/24) he noticed increased pain .  Notes that Sitting too long causes  increased numbness in legs, especially when sitting on toilet.  Standing/walking worst pain.  Back: sore/pain  up to 8/10 at times. Was quite bad about 1 week ago. Denies any radicular pain now or in the past few months.  May/June, had injections, did have some relief after 2nd one.  Other:  R torn tendon in wrist Working full time  [employer], not doing at this time.  Has just finished cancer treatment with 2 surgeries. Feeling good, eating, sleeping well, Feels tired, some fatigue, not back to regular energy or activity yet.    PERTINENT HISTORY: Recent rectal cancer, surgery x 2 and chemo.   PAIN:  Are you having pain? Yes: NPRS scale: 4 /10 Pain location: low back/L5/S1/Central  Pain description: Sore, stiff  Aggravating factors: sitting, standing  Relieving factors: none stated   PRECAUTIONS: None  WEIGHT BEARING RESTRICTIONS: No  FALLS:  Has patient fallen in last 6 months? No  PLOF: Independent  PATIENT GOALS:  Decreased pain   NEXT  MD VISIT:   OBJECTIVE:   DIAGNOSTIC FINDINGS:   IMPRESSION: Mild disc and facet degeneration in the mid and lower lumbar spine with up to mild lateral recess and neural foraminal stenosis as above.   PATIENT SURVEYS:  Need FOTO   COGNITION: Overall cognitive status: Within functional limits for tasks assessed     SENSATION: WFL  POSTURE:    No Significant postural limitations  PALPATION:  Tenderness in central lumbar spine, increased at L4/5,  No pain in SIJ, Minimal pain into glutes,  Tightness in bil HS  L>R; Tightness in bil thoracic paraspinals.   LOWER EXTREMITY ROM:  Lumbar: Flex: WFL,  Ext: mild  limitation (pain) ,  SB: WFL bil;   Active ROM Left eval Right eval  Hip flexion wfl Mild limitation/pain anteriorly  Hip extension wfl wfl  Hip abduction    Hip adduction    Hip internal rotation    Hip external rotation wfl Mild limitation  Knee flexion    Knee extension    Ankle dorsiflexion    Ankle plantarflexion    Ankle inversion    Ankle eversion     (Blank rows = not tested)  LOWER EXTREMITY MMT:  MMT Left eval Right  eval  Hip flexion 4+ 4+  Hip extension    Hip abduction 4+ 4+  Hip adduction    Hip internal rotation    Hip external rotation    Knee flexion 5 5  Knee extension 5 5  Ankle dorsiflexion    Ankle plantarflexion    Ankle inversion    Ankle eversion     (Blank rows = not tested)  LOWER EXTREMITY SPECIAL TESTS:  Neg SLR today   GAIT: unremarkable    TODAY'S TREATMENT:                                                                                                                              DATE:   10/19/2022  Therapeutic Exercise: Aerobic: Supine:   bridging x 10, with march x 10 ;   hip flex/ext at leg 90/90 with TA- at end of table x 10 then x 5 bil;   Bicycle-high- x 10 ;  side plank on knees 30 sec x 2 bil;   Seated: Standing:  Stretches:  Seated HSS 30 sec x 3 bil;   LTR x 10;  Supine  Piriformis 30 sec x 3 bil;  Neuromuscular Re-education: Manual Therapy:   STM bil low lubmar, light PA mobs, thoracic PA mobs, long leg distraction bil for lumbar;  Therapeutic Activity: Self Care:    Therapeutic Exercise: Aerobic: Supine:   bridging x 15 ;   Prone: hip ext 2 x 10 ;  press ups to elbows x 12 (sore)  Seated: Standing:  sit to stand x 10,  squats x 10 with education on mechanics ;  Squat/pick up from 12 in height 20lb x 6, from ground x 5;  Stretches:  Seated HSS 30 sec x 3 bil;   LTR x 10;  Seated Piriformis 30 sec x 3 bil;  pelvic tilts x 15;  cat cow x 15;  Neuromuscular Re-education: Manual Therapy:   Therapeutic  Activity: Self Care:   Previous:  Therapeutic Exercise: Aerobic: Supine: Supine march with TA 2 x 10;    bridging x 15 ;  SLR with TA 2 x 10 bil  Prone: hip ext 2 x 10 ; press ups to elbows x 12 (sore)  Seated: Standing: Stretches:  Seated HSS 30 sec x 3 bil;   LTR x 10;  Seated Piriformis 30 sec x 3 bil;  pelvic tilts x 15;  cat cow x 15;  Neuromuscular Re-education: Manual Therapy:   Therapeutic Activity: Self Care:   Therapeutic Exercise: Aerobic: Supine: Supine march with TA 2 x 10;;   bridging 2 x 10 ;  Seated:  wrist ext with slow lowering 2 lb x 10;  Sit to stand, higher mat table, x 10, with education on back posture and TA  Standing: Stretches:  LTR x 10;  Piriformis 30 sec x 3 bil; pelvic tilts x 15; cat cow x 15;  Neuromuscular Re-education: Manual Therapy: STM/DTM to bil low lumbar  Therapeutic Activity: Self Care:    PATIENT EDUCATION:  Education details: reviewed HEP Person educated: Patient Education method: Programmer, multimedia, Demonstration, Tactile cues, Verbal cues, and Handouts Education comprehension: verbalized understanding, returned demonstration, verbal cues required, tactile cues required, and needs further education   HOME EXERCISE PROGRAM: Access Code: [email or web address]    ASSESSMENT:  CLINICAL IMPRESSION: Pt with continued pain with even a small amount of lumbar extension. Pain located in central, low lumbar,  L/S junction. No pain with prone laying on 2 pillows, but pain increased even with flat prone laying. Also has pain in standing with extension. He is challenged with core strength and stability today, but able to perform without back pain, when focused on core contraction. Will benefit from continued care for strength, stability, and pain relief.    Eval: Patient presents with primary complaint of increased pain in low back.  He has most pain in central low lumbar region. Pt with poor movement quality and ability for functional activity due to pain  and fear of pain. He has decreased ability for bending, sitting, as well as standing and IADLS.  Pt will benefit from education on HEP for management and progressing to pain free increase in physical activity/exercise.  Pt will  benefit from skilled PT to improve deficits and pain and to return to PLOF.   OBJECTIVE IMPAIRMENTS: decreased activity tolerance, decreased mobility, decreased ROM, decreased strength, increased muscle spasms, improper body mechanics, and pain.   ACTIVITY LIMITATIONS: bending, sitting, standing, squatting, stairs, transfers, hygiene/grooming, and locomotion level  PARTICIPATION LIMITATIONS: cleaning, laundry, shopping, community activity, occupation, and yard work  PERSONAL FACTORS: Time since onset of injury/illness/exacerbation are also affecting patient's functional outcome.   REHAB POTENTIAL: Good  CLINICAL DECISION MAKING: Stable/uncomplicated  EVALUATION COMPLEXITY: Low   GOALS: Goals reviewed with patient? Yes  SHORT TERM GOALS: Target date: 09/21/2022   Pt to be independent with initial HEP  Goal status: INITIAL    LONG TERM GOALS: Target date: 11/02/2022   Pt to be independent with final HEP  Goal status: INITIAL  2.  Pt to demo pain free lumbar ROM to be [facility]  Goal status: INITIAL  3.  Pt to demo ability for functional bend, lift, squat with optimal mechanics, to improve  ability and back pain with IADLs.   Goal status: INITIAL  4.  Pt to report return to exercise for at least 30 min with pain 0-1/10 in low back.   Goal status: INITIAL    PLAN:  PT FREQUENCY: 1-2x/week  PT DURATION: 8 weeks  PLANNED INTERVENTIONS: Therapeutic exercises, Therapeutic activity, Neuromuscular re-education, Patient/Family education, Self Care, Joint mobilization, Joint manipulation, Stair training, Orthotic/Fit training, DME instructions, Aquatic Therapy, Dry Needling, Electrical stimulation, Cryotherapy, Moist heat, Taping, Ultrasound, Ionotophoresis  4mg /ml Dexamethasone, Manual therapy,  Vasopneumatic device, Traction, Spinal manipulation, Spinal mobilization,Balance training, Gait training,   PLAN FOR NEXT SESSION:   Lumbar decompression, gentile lumbar mobility, work into extension when comfortable, hip stretches, TA and core strength, progress to functional strength.    Sedalia Muta, PT, DPT 9:11 AM  10/19/22

## 2022-10-23 ENCOUNTER — Other Ambulatory Visit: Payer: Self-pay | Admitting: Internal Medicine

## 2022-10-25 ENCOUNTER — Encounter: Payer: Self-pay | Admitting: Physical Therapy

## 2022-10-25 ENCOUNTER — Ambulatory Visit (INDEPENDENT_AMBULATORY_CARE_PROVIDER_SITE_OTHER): Payer: BC Managed Care – PPO | Admitting: Physical Therapy

## 2022-10-25 DIAGNOSIS — M5459 Other low back pain: Secondary | ICD-10-CM | POA: Diagnosis not present

## 2022-10-25 NOTE — Therapy (Signed)
OUTPATIENT PHYSICAL THERAPY TREATMENT   Patient Name: Edward [name] MRN: [id number] DOB:[birth year], 47 y.o., male Today's Date: 10/25/2022   END OF SESSION:  PT End of Session - 10/25/22 1337     Visit Number 7    Number of Visits 16    Date for PT Re-Evaluation 11/02/22    Authorization Type [employer]    PT Start Time 0806    PT Stop Time 0847    PT Time Calculation (min) 41 min    Activity Tolerance Patient tolerated treatment well    Behavior During Therapy WFL for tasks assessed/performed                  Past Medical History:  Diagnosis Date   Arthritis    OA right knee   Family history of prostate cancer    Kidney stones    has a horse shoe kidney   Rectal cancer (HCC)    Thyroid disease    History reviewed. No pertinent surgical history. Patient Active Problem List   Diagnosis Date Noted   Right wrist pain 07/28/2022   Midline back pain 06/08/2022   Right carpal tunnel syndrome 04/25/2022   Adjustment disorder 11/04/2021   Skin mole 08/11/2021   Extensor carpi ulnaris tendinitis 06/01/2021   Routine general medical examination at a health care facility 01/05/2021   Genetic testing 12/28/2020   [facility] joint pain 11/24/2020   Rectal cancer (HCC) 09/22/2020   Insomnia 09/22/2020   Hypothyroid 09/22/2020   Nonallopathic lesion of thoracic region 11/25/2018   Nonallopathic lesion of sacral region 11/25/2018   Nonallopathic lesion of lumbosacral region 11/25/2018   Nonallopathic lesion of rib cage 11/25/2018   Nonallopathic lesion of cervical region 11/25/2018   Trigger point of left shoulder region 08/29/2018   Patellofemoral arthralgia of right knee 07/25/2018   Scapular dyskinesis 07/25/2018     PCP: Hillard Danker  REFERRING PROVIDER:  Terrilee Files   REFERRING DIAG: Low back pain  THERAPY DIAG:  Other low back pain  Rationale for Evaluation and Treatment: Rehabilitation  ONSET DATE:    SUBJECTIVE:   SUBJECTIVE STATEMENT: Pt states  back has been a little more sore. In the last couple days.   Eval: Pt states new onset of back pain. Reports that it Could have started maybe about 2 years ago mildly.  After surgery this past March(3/24) he noticed increased pain .  Notes that Sitting too long causes  increased numbness in legs, especially when sitting on toilet.  Standing/walking worst pain.  Back: sore/pain  up to 8/10 at times. Was quite bad about 1 week ago. Denies any radicular pain now or in the past few months.  May/June, had injections, did have some relief after 2nd one.  Other:  R torn tendon in wrist Working full time  [employer], not doing at this time.  Has just finished cancer treatment with 2 surgeries. Feeling good, eating, sleeping well, Feels tired, some fatigue, not back to regular energy or activity yet.    PERTINENT HISTORY: Recent rectal cancer, surgery x 2 and chemo.   PAIN:  Are you having pain? Yes: NPRS scale: 4 /10 Pain location: low back/L5/S1/Central  Pain description: Sore, stiff  Aggravating factors: sitting, standing  Relieving factors: none stated   PRECAUTIONS: None  WEIGHT BEARING RESTRICTIONS: No  FALLS:  Has patient fallen in last 6 months? No  PLOF: Independent  PATIENT GOALS:  Decreased pain   NEXT MD VISIT:   OBJECTIVE:   DIAGNOSTIC  FINDINGS:   IMPRESSION: Mild disc and facet degeneration in the mid and lower lumbar spine with up to mild lateral recess and neural foraminal stenosis as above.   PATIENT SURVEYS:  Need FOTO   COGNITION: Overall cognitive status: Within functional limits for tasks assessed     SENSATION: WFL  POSTURE:    No Significant postural limitations  PALPATION:  Tenderness in central lumbar spine, increased at L4/5,  No pain in SIJ, Minimal pain into glutes,  Tightness in bil HS  L>R; Tightness in bil thoracic paraspinals.   LOWER EXTREMITY ROM:  Lumbar: Flex: WFL,  Ext: mild limitation (pain) ,  SB: WFL bil;   Active ROM  Left eval Right eval  Hip flexion wfl Mild limitation/pain anteriorly  Hip extension wfl wfl  Hip abduction    Hip adduction    Hip internal rotation    Hip external rotation wfl Mild limitation  Knee flexion    Knee extension    Ankle dorsiflexion    Ankle plantarflexion    Ankle inversion    Ankle eversion     (Blank rows = not tested)  LOWER EXTREMITY MMT:  MMT Left eval Right  eval  Hip flexion 4+ 4+  Hip extension    Hip abduction 4+ 4+  Hip adduction    Hip internal rotation    Hip external rotation    Knee flexion 5 5  Knee extension 5 5  Ankle dorsiflexion    Ankle plantarflexion    Ankle inversion    Ankle eversion     (Blank rows = not tested)  LOWER EXTREMITY SPECIAL TESTS:  Neg SLR today   GAIT: unremarkable    TODAY'S TREATMENT:                                                                                                                              DATE:   10/25/2022  Therapeutic Exercise: Aerobic: Supine:  SL bridging 2 x 5 bil; SLR x 10 bil with TA;  Prone : hip ext  x 10 bil;  Seated: Standing:   squats x 15; Rows with TA x 20 Btb,  Ue flex with TA x 10;  Stretches:  Cat/cow x 20;   Supine HSS knee flex/ext x15 bil with strap;  Neuromuscular Re-education: Manual Therapy:   STM bil low lubmar, light PA mobs, thoracic PA mobs, long leg distraction bil for lumbar;  Therapeutic Activity: Self Care:   hip flex/ext at leg 90/90 with TA- at end of table   Therapeutic Exercise: Aerobic: Supine:   bridging x 15 ;   Prone: hip ext 2 x 10 ;  press ups to elbows x 12 (sore)  Seated: Standing:  sit to stand x 10,  squats x 10 with education on mechanics ;  Squat/pick up from 12 in height 20lb x 6, from ground x 5;  Stretches:  Seated HSS 30 sec x 3 bil;   LTR  x 10;  Seated Piriformis 30 sec x 3 bil;  pelvic tilts x 15;  cat cow x 15;  Neuromuscular Re-education: Manual Therapy:   Therapeutic Activity: Self Care:   Previous:   Therapeutic Exercise: Aerobic: Supine: Supine march with TA 2 x 10;    bridging x 15 ;  SLR with TA 2 x 10 bil  Prone: hip ext 2 x 10 ; press ups to elbows x 12 (sore)  Seated: Standing: Stretches:  Seated HSS 30 sec x 3 bil;   LTR x 10;  Seated Piriformis 30 sec x 3 bil;  pelvic tilts x 15;  cat cow x 15;  Neuromuscular Re-education: Manual Therapy:   Therapeutic Activity: Self Care:   Therapeutic Exercise: Aerobic: Supine: Supine march with TA 2 x 10;;   bridging 2 x 10 ;  Seated:  wrist ext with slow lowering 2 lb x 10;  Sit to stand, higher mat table, x 10, with education on back posture and TA  Standing: Stretches:  LTR x 10;  Piriformis 30 sec x 3 bil; pelvic tilts x 15; cat cow x 15;  Neuromuscular Re-education: Manual Therapy: STM/DTM to bil low lumbar  Therapeutic Activity: Self Care:    PATIENT EDUCATION:  Education details: reviewed HEP Person educated: Patient Education method: Explanation, Demonstration, Tactile cues, Verbal cues, and Handouts Education comprehension: verbalized understanding, returned demonstration, verbal cues required, tactile cues required, and needs further education   HOME EXERCISE PROGRAM: Access Code: [email or web address]    ASSESSMENT:  CLINICAL IMPRESSION: Pain located in central, low lumbar,  L/S junction. Pt with minimal pain during activities today. He has improved ability for hip extension without pain, after loosening up with cat cow first. He has no pain with extension in cat cow position, but does in prone or standing position. He is improving with ability for core contraction, still challenged with core control  with dynamic movement.    Eval: Patient presents with primary complaint of increased pain in low back.  He has most pain in central low lumbar region. Pt with poor movement quality and ability for functional activity due to pain and fear of pain. He has decreased ability for bending, sitting, as well as standing and IADLS.   Pt will benefit from education on HEP for management and progressing to pain free increase in physical activity/exercise.  Pt will  benefit from skilled PT to improve deficits and pain and to return to PLOF.   OBJECTIVE IMPAIRMENTS: decreased activity tolerance, decreased mobility, decreased ROM, decreased strength, increased muscle spasms, improper body mechanics, and pain.   ACTIVITY LIMITATIONS: bending, sitting, standing, squatting, stairs, transfers, hygiene/grooming, and locomotion level  PARTICIPATION LIMITATIONS: cleaning, laundry, shopping, community activity, occupation, and yard work  PERSONAL FACTORS: Time since onset of injury/illness/exacerbation are also affecting patient's functional outcome.   REHAB POTENTIAL: Good  CLINICAL DECISION MAKING: Stable/uncomplicated  EVALUATION COMPLEXITY: Low   GOALS: Goals reviewed with patient? Yes  SHORT TERM GOALS: Target date: 09/21/2022   Pt to be independent with initial HEP  Goal status: INITIAL    LONG TERM GOALS: Target date: 11/02/2022   Pt to be independent with final HEP  Goal status: INITIAL  2.  Pt to demo pain free lumbar ROM to be [facility]  Goal status: INITIAL  3.  Pt to demo ability for functional bend, lift, squat with optimal mechanics, to improve ability and back pain with IADLs.   Goal status: INITIAL  4.  Pt to report return to exercise  for at least 30 min with pain 0-1/10 in low back.   Goal status: INITIAL    PLAN:  PT FREQUENCY: 1-2x/week  PT DURATION: 8 weeks  PLANNED INTERVENTIONS: Therapeutic exercises, Therapeutic activity, Neuromuscular re-education, Patient/Family education, Self Care, Joint mobilization, Joint manipulation, Stair training, Orthotic/Fit training, DME instructions, Aquatic Therapy, Dry Needling, Electrical stimulation, Cryotherapy, Moist heat, Taping, Ultrasound, Ionotophoresis 4mg /ml Dexamethasone, Manual therapy,  Vasopneumatic device, Traction, Spinal manipulation,  Spinal mobilization,Balance training, Gait training,   PLAN FOR NEXT SESSION:   Lumbar decompression, gentile lumbar mobility, work into extension when comfortable, hip stretches, TA and core strength, progress to functional strength.    Sedalia Muta, PT, DPT 1:37 PM  10/25/22

## 2022-10-26 ENCOUNTER — Telehealth: Payer: Self-pay

## 2022-10-26 NOTE — Telephone Encounter (Signed)
Pt has called and stated he is needing a TDAP vacc as he has not had one in over 10 years. Pt has been sched for a nurse visit to get his Flu vacc and was wanting to get the TDAP at this time as well. Pt does not have any cuts or wounds at this time for reason of wanting or needing TDAP.  **Needing okay from PCP to give pt TDAP vacc on 10/28/2022.

## 2022-10-27 NOTE — Telephone Encounter (Signed)
Due, fine

## 2022-10-28 ENCOUNTER — Encounter: Payer: BC Managed Care – PPO | Admitting: Physical Therapy

## 2022-10-28 ENCOUNTER — Ambulatory Visit (INDEPENDENT_AMBULATORY_CARE_PROVIDER_SITE_OTHER): Payer: BC Managed Care – PPO

## 2022-10-28 ENCOUNTER — Other Ambulatory Visit: Payer: BC Managed Care – PPO

## 2022-10-28 DIAGNOSIS — Z23 Encounter for immunization: Secondary | ICD-10-CM

## 2022-10-28 DIAGNOSIS — Z139 Encounter for screening, unspecified: Secondary | ICD-10-CM

## 2022-10-28 NOTE — Progress Notes (Signed)
Pt was given Regular Flu vacc and Tdap w/o any complications at this time.

## 2022-10-29 LAB — MEASLES/MUMPS/RUBELLA IMMUNITY
Mumps IgG: <9 [AU]/ml — ABNORMAL LOW
Rubella: 1.12 {index}
Rubeola IgG: <13.5 [AU]/ml — ABNORMAL LOW

## 2022-10-31 LAB — QUANTIFERON-TB GOLD PLUS
Mitogen-NIL: 9.79 [IU]/mL
NIL: 0.01 [IU]/mL
QuantiFERON-TB Gold Plus: NEGATIVE
TB1-NIL: 0 [IU]/mL
TB2-NIL: 0.12 [IU]/mL

## 2022-10-31 LAB — VARICELLA ZOSTER ABS, IGG/IGM
Varicella IgM: <0.91 {index} (ref 0.00–0.90)
Varicella zoster IgG: 2833 {index} (ref >165)

## 2022-11-02 ENCOUNTER — Encounter: Payer: BC Managed Care – PPO | Admitting: Physical Therapy

## 2022-11-03 ENCOUNTER — Ambulatory Visit (INDEPENDENT_AMBULATORY_CARE_PROVIDER_SITE_OTHER): Payer: BC Managed Care – PPO | Admitting: Radiology

## 2022-11-03 DIAGNOSIS — Z23 Encounter for immunization: Secondary | ICD-10-CM

## 2022-11-03 NOTE — Progress Notes (Signed)
Patient here for mmr vaccine. Patient tolerate well with no complications. Patient has 2nd dose scheduled.

## 2022-11-03 NOTE — Discharge Instructions (Signed)

## 2022-11-04 ENCOUNTER — Ambulatory Visit
Admission: RE | Admit: 2022-11-04 | Discharge: 2022-11-04 | Disposition: A | Discharge status: Home / Self Care | Payer: BC Managed Care – PPO | Source: Ambulatory Visit | Attending: Family Medicine | Admitting: Family Medicine

## 2022-11-04 DIAGNOSIS — M5442 Lumbago with sciatica, left side: Secondary | ICD-10-CM

## 2022-11-04 MED ORDER — IOPAMIDOL (ISOVUE-M 200) INJECTION 41%
1.0000 mL | Freq: Once | INTRAMUSCULAR | Status: AC
  Administered 2022-11-04: 1 mL via EPIDURAL

## 2022-11-04 MED ORDER — METHYLPREDNISOLONE ACETATE 40 MG/ML INJ SUSP (RADIOLOG
80.0000 mg | Freq: Once | INTRAMUSCULAR | Status: AC
  Administered 2022-11-04: 80 mg via EPIDURAL

## 2022-11-09 ENCOUNTER — Encounter: Payer: Self-pay | Admitting: Physical Therapy

## 2022-11-09 ENCOUNTER — Ambulatory Visit (INDEPENDENT_AMBULATORY_CARE_PROVIDER_SITE_OTHER): Payer: BC Managed Care – PPO | Admitting: Physical Therapy

## 2022-11-09 DIAGNOSIS — M5459 Other low back pain: Secondary | ICD-10-CM

## 2022-11-09 NOTE — Progress Notes (Unsigned)
[name] [address] [place] [address] Phone: [phone] Subjective:   Edward [name], am serving as a scribe for Dr. Antoine Primas.  I'm seeing this patient by the request  of:  Myrlene Broker, MD  CC: Left upper back and shoulder pain  [id number]  09/27/2022 Repeat injections given today, tolerated the procedure well, discussed icing regimen and home exercises, discussed which activities to do and which ones to avoid.  Still think that some of it could be more cervical radiculopathy.  Has had significant difficulty with all his other health and could be potentially contributing.  Follow-up again in 6 to 8 weeks.     Midline low back pain repeat injection at this time.   Wrist exam does look like and is making improvement.  I do see actually some scar tissue formation noted.  Some even new tendon aspect noted.  Still some mild surrounding hypoechoic changes.  Patient will continue with the home exercises.  Still very hopeful that the PRP will make significant improvement over the course of time.  Follow-up with me again in 6 to 8 weeks otherwise      Update 11/10/2022 Edward [name] is a 47 y.o. male coming in with complaint of R wrist and LBP. Patient states wrist is doing so so. Back is about the same.  States that the shoulder seems to be the most frustrating part.  Starting to hurt her on the regular basis.  Starting to affect daily activities and waking him up at night.  Has been doing formal physical therapy and doing well for quite some time and feels like at this moment the quality of life seems to be becoming more difficult.       Past Medical History:  Diagnosis Date   Arthritis    OA right knee   Family history of prostate cancer    Kidney stones    has a horse shoe kidney   Rectal cancer (HCC)    Thyroid disease    No past surgical history on file. Social History   Socioeconomic History   Marital status:  Married    Spouse name: Not on file   Number of children: Not on file   Years of education: Not on file   Highest education level: Not on file  Occupational History   Not on file  Tobacco Use   Smoking status: Former   Smokeless tobacco: Never  Vaping Use   Vaping status: Never Used  Substance and Sexual Activity   Alcohol use: Yes    Comment: glass of wine at [facility]   Drug use: Never   Sexual activity: Not on file  Other Topics Concern   Not on file  Social History Narrative   Not on file   Social Determinants of Health   Financial Resource Strain: Patient Declined (08/25/2022)   Overall Financial Resource Strain (CARDIA)    Difficulty of Paying Living Expenses: Patient declined  Food Insecurity: Patient Declined (08/25/2022)   Hunger Vital Sign    Worried About Running Out of Food in the Last Year: Patient declined    Ran Out of Food in the Last Year: Patient declined  Transportation Needs: Patient Declined (08/25/2022)   PRAPARE - Administrator, Civil Service (Medical): Patient declined    Lack of Transportation (Non-Medical): Patient declined  Physical Activity: Unknown (08/25/2022)   Exercise Vital Sign    Days of Exercise per Week: Patient declined  Minutes of Exercise per Session: Not on file  Stress: Stress Concern Present (08/25/2022)   [employer] of Occupational Health - Occupational Stress Questionnaire    Feeling of Stress : To some extent  Social Connections: Unknown (08/25/2022)   Social Connection and Isolation Panel [NHANES]    Frequency of Communication with Friends and Family: Patient declined    Frequency of Social Gatherings with Friends and Family: Patient declined    Attends Religious Services: Patient declined    Database administrator or Organizations: Patient declined    Attends Engineer, structural: Not on file    Marital Status: Patient declined   No Known Allergies Family History  Problem Relation Age of Onset    Depression Mother    Asthma Father    Cancer Father    CAD Father    Hyperlipidemia Father    Hypertension Father    Colon cancer Neg Hx    Esophageal cancer Neg Hx    Rectal cancer Neg Hx    Stomach cancer Neg Hx     Current Outpatient Medications (Endocrine & Metabolic):    levothyroxine (SYNTHROID) 75 MCG tablet, TAKE 1 TABLET(75 MCG) BY MOUTH DAILY BEFORE BREAKFAST    Current Outpatient Medications (Analgesics):    meloxicam (MOBIC) 15 MG tablet, Take 1 tablet (15 mg total) by mouth daily.  Current Outpatient Medications (Hematological):    vitamin B-12 (CYANOCOBALAMIN) 1000 MCG tablet, Take 1,000 mcg by mouth daily.  Current Outpatient Medications (Other):    cholecalciferol (VITAMIN D3) 25 MCG (1000 UT) tablet, Take 2,000 Units by mouth 2 (two) times a day.   DULoxetine (CYMBALTA) 20 MG capsule, TAKE 1 CAPSULE(20 MG) BY MOUTH TWICE DAILY   hydrocortisone (ANUSOL-HC) 2.5 % rectal cream, Place 1 Application rectally 2 (two) times daily.   traZODone (DESYREL) 50 MG tablet, TAKE 1/2 TO 1 TABLET(25 TO 50 MG) BY MOUTH AT BEDTIME AS NEEDED FOR SLEEP   Reviewed prior external information including notes and imaging from  primary care provider As well as notes that were available from care everywhere and other healthcare systems.  Past medical history, social, surgical and family history all reviewed in electronic medical record.  No pertanent information unless stated regarding to the chief complaint.   Review of Systems:  No headache, visual changes, nausea, vomiting, diarrhea, constipation, dizziness, abdominal pain, skin rash, fevers, chills, night sweats, weight loss, swollen lymph nodes, body aches, joint swelling, chest pain, shortness of breath, mood changes. POSITIVE muscle aches  Objective  Blood pressure (!) 130/90, pulse 79, height 5\' 9"  (1.753 m), weight 170 lb (77.1 kg), SpO2 96%.   General: No apparent distress alert and oriented x3 mood and affect normal,  dressed appropriately.  HEENT: Pupils equal, extraocular movements intact  Respiratory: Patient's speak in full sentences and does not appear short of breath  Cardiovascular: No lower extremity edema, non tender, no erythema  Patient's left shoulder demonstrates some weakness with 3+ out of 5 strength noted.  Patient does have tenderness in the paraspinal musculature in the parascapular area with multiple trigger points in the rhomboid, trapezius, and levator scapula.  Right wrist exam still has some limited extension noted.  Limited muscular skeletal ultrasound was performed and interpreted by Antoine Primas, M  Limited ultrasound shows the patient does have still very mild hypoechoic changes and some degenerative changes of the TFCC but does seem to be significantly improved.   After verbal consent patient was prepped with alcohol swab and 4 distinct  trigger points in the trapezius, rhomboid, latissimus dorsi and it was injected with a total of 3 cc of 0.5% Marcaine and 1 cc of Kenalog 40 mg/mL.  No blood loss.  Band-Aid placed.  Postinjection instructions given.   Impression and Recommendations:     The above documentation has been reviewed and is accurate and complete Judi Saa, DO

## 2022-11-09 NOTE — Therapy (Signed)
OUTPATIENT PHYSICAL THERAPY TREATMENT/Re-Cert    Patient Name: Edward [name] MRN: [id number] DOB:1975-10-28, 47 y.o., male Today's Date: 11/09/2022   END OF SESSION:  PT End of Session - 11/09/22 0851     Visit Number 8    Number of Visits 16    Date for PT Re-Evaluation 12/07/22    Authorization Type BCBS    PT Start Time 0848    PT Stop Time 0929    PT Time Calculation (min) 41 min    Activity Tolerance Patient tolerated treatment well    Behavior During Therapy WFL for tasks assessed/performed                  Past Medical History:  Diagnosis Date   Arthritis    OA right knee   Family history of prostate cancer    Kidney stones    has a horse shoe kidney   Rectal cancer (HCC)    Thyroid disease    History reviewed. No pertinent surgical history. Patient Active Problem List   Diagnosis Date Noted   Right wrist pain 07/28/2022   Midline back pain 06/08/2022   Right carpal tunnel syndrome 04/25/2022   Adjustment disorder 11/04/2021   Skin mole 08/11/2021   Extensor carpi ulnaris tendinitis 06/01/2021   Routine general medical examination at a health care facility 01/05/2021   Genetic testing 12/28/2020   [facility] joint pain 11/24/2020   Rectal cancer (HCC) 09/22/2020   Insomnia 09/22/2020   Hypothyroid 09/22/2020   Nonallopathic lesion of thoracic region 11/25/2018   Nonallopathic lesion of sacral region 11/25/2018   Nonallopathic lesion of lumbosacral region 11/25/2018   Nonallopathic lesion of rib cage 11/25/2018   Nonallopathic lesion of cervical region 11/25/2018   Trigger point of left shoulder region 08/29/2018   Patellofemoral arthralgia of right knee 07/25/2018   Scapular dyskinesis 07/25/2018     PCP: Hillard Danker  REFERRING PROVIDER:  Terrilee Files   REFERRING DIAG: Low back pain  THERAPY DIAG:  Other low back pain  Rationale for Evaluation and Treatment: Rehabilitation  ONSET DATE:    SUBJECTIVE:   SUBJECTIVE STATEMENT Pt  had another injection last week at L4/5. He has not been able to tell if it has helped, but does feel he is doing better recently. He has been able to do more around the house without increased pain or flare ups. He has not been doing much of HEP.   Eval: Pt states new onset of back pain. Reports that it Could have started maybe about 2 years ago mildly.  After surgery this past March(3/24) he noticed increased pain .  Notes that Sitting too long causes  increased numbness in legs, especially when sitting on toilet.  Standing/walking worst pain.  Back: sore/pain  up to 8/10 at times. Was quite bad about 1 week ago. Denies any radicular pain now or in the past few months.  May/June, had injections, did have some relief after 2nd one.  Other:  R torn tendon in wrist Working full time  [employer], not doing at this time.  Has just finished cancer treatment with 2 surgeries. Feeling good, eating, sleeping well, Feels tired, some fatigue, not back to regular energy or activity yet.    PERTINENT HISTORY: Recent rectal cancer, surgery x 2 and chemo.   PAIN:  Are you having pain? Yes: NPRS scale: 4 /10 Pain location: low back/L5/S1/Central  Pain description: Sore, stiff  Aggravating factors: sitting, standing  Relieving factors: none stated  PRECAUTIONS: None  WEIGHT BEARING RESTRICTIONS: No  FALLS:  Has patient fallen in last 6 months? No  PLOF: Independent  PATIENT GOALS:  Decreased pain   NEXT MD VISIT:   OBJECTIVE: updated 11/09/22  DIAGNOSTIC FINDINGS:   IMPRESSION: Mild disc and facet degeneration in the mid and lower lumbar spine with up to mild lateral recess and neural foraminal stenosis as above.   PATIENT SURVEYS:  Need FOTO   COGNITION: Overall cognitive status: Within functional limits for tasks assessed     SENSATION: WFL  POSTURE:    No Significant postural limitations  PALPATION:  Tenderness in central lumbar spine, increased at L4/5,  No pain in SIJ,  Tightness in bil HS  L>R; Tightness in bil thoracic paraspinals.   LOWER EXTREMITY ROM:  Lumbar: Flex: WFL,  Ext: mild limitation (pain) ,  SB: WFL bil;   Active ROM Left eval Right 11/09/22  Hip flexion wfl WFL  Hip extension wfl wfl  Hip abduction    Hip adduction    Hip internal rotation    Hip external rotation wfl Mild limitation  Knee flexion    Knee extension    Ankle dorsiflexion    Ankle plantarflexion    Ankle inversion    Ankle eversion     (Blank rows = not tested)  LOWER EXTREMITY MMT:  MMT Left eval Right  eval  Hip flexion 4+ 4+  Hip extension    Hip abduction 4+ 4+  Hip adduction    Hip internal rotation    Hip external rotation    Knee flexion 5 5  Knee extension 5 5  Ankle dorsiflexion    Ankle plantarflexion    Ankle inversion    Ankle eversion     (Blank rows = not tested)  LOWER EXTREMITY SPECIAL TESTS:  Neg SLR today   GAIT: unremarkable    TODAY'S TREATMENT:                                                                                                                              DATE:   11/09/2022 Therapeutic Exercise: Aerobic: Supine:  SL bridging regular x 10, then  2 x 5 bil;  SLR 2 x 10 bil with TA;  Quadruped: bird dog, 2 x 10;  Seated: Standing:    Stretches:  Cat/cow x 10:  LTR x 10 , Seated HSS x 3 bil;  Neuromuscular Re-education: Manual Therapy:    light PA mobs, thoracic PA mobs, long leg distraction bil for lumbar;  Therapeutic Activity: Self Care:    Therapeutic Exercise: Aerobic: Supine:   bridging x 15 ;   Prone: hip ext 2 x 10 ;  press ups to elbows x 12 (sore)  Seated: Standing:  sit to stand x 10,  squats x 10 with education on mechanics ;  Squat/pick up from 12 in height 20lb x 6, from ground x 5;  Stretches:  Seated HSS 30 sec x  3 bil;   LTR x 10;  Seated Piriformis 30 sec x 3 bil;  pelvic tilts x 15;  cat cow x 15;  Neuromuscular Re-education: Manual Therapy:   Therapeutic Activity: Self  Care:   Previous:  Therapeutic Exercise: Aerobic: Supine: Supine march with TA 2 x 10;    bridging x 15 ;  SLR with TA 2 x 10 bil  Prone: hip ext 2 x 10 ; press ups to elbows x 12 (sore)  Seated: Standing: Stretches:  Seated HSS 30 sec x 3 bil;   LTR x 10;  Seated Piriformis 30 sec x 3 bil;  pelvic tilts x 15;  cat cow x 15;  Neuromuscular Re-education: Manual Therapy:   Therapeutic Activity: Self Care:   Therapeutic Exercise: Aerobic: Supine: Supine march with TA 2 x 10;;   bridging 2 x 10 ;  Seated:  wrist ext with slow lowering 2 lb x 10;  Sit to stand, higher mat table, x 10, with education on back posture and TA  Standing: Stretches:  LTR x 10;  Piriformis 30 sec x 3 bil; pelvic tilts x 15; cat cow x 15;  Neuromuscular Re-education: Manual Therapy: STM/DTM to bil low lumbar  Therapeutic Activity: Self Care:    PATIENT EDUCATION:  Education details: reviewed HEP Person educated: Patient Education method: Explanation, Demonstration, Tactile cues, Verbal cues, and Handouts Education comprehension: verbalized understanding, returned demonstration, verbal cues required, tactile cues required, and needs further education   HOME EXERCISE PROGRAM: Access Code: [email or web address]    ASSESSMENT:  CLINICAL IMPRESSION: Pt has been seen for 8 visits.  Pt overall with improving pain and function. He continues to have low level soreness in low back. Focus on education today for increased frequency of HEP for best outcome, and for continuing strengthening for pain relief. He is improving with core activation, but continues to have deficits here, that will benefit from continued/focused stabilization for core. Reviewed and updated HEP today. Pt will return next week after his sports med appt. Will likely make d/c plan next week.    Eval: Patient presents with primary complaint of increased pain in low back.  He has most pain in central low lumbar region. Pt with poor movement quality and  ability for functional activity due to pain and fear of pain. He has decreased ability for bending, sitting, as well as standing and IADLS.  Pt will benefit from education on HEP for management and progressing to pain free increase in physical activity/exercise.  Pt will  benefit from skilled PT to improve deficits and pain and to return to PLOF.   OBJECTIVE IMPAIRMENTS: decreased activity tolerance, decreased mobility, decreased ROM, decreased strength, increased muscle spasms, improper body mechanics, and pain.   ACTIVITY LIMITATIONS: bending, sitting, standing, squatting, stairs, transfers, hygiene/grooming, and locomotion level  PARTICIPATION LIMITATIONS: cleaning, laundry, shopping, community activity, occupation, and yard work  PERSONAL FACTORS: Time since onset of injury/illness/exacerbation are also affecting patient's functional outcome.   REHAB POTENTIAL: Good  CLINICAL DECISION MAKING: Stable/uncomplicated  EVALUATION COMPLEXITY: Low   GOALS: Goals reviewed with patient? Yes  SHORT TERM GOALS: Target date: 09/21/2022   Pt to be independent with initial HEP  Goal status: MET    LONG TERM GOALS: Target date: 12/07/2022   Pt to be independent with final HEP  Goal status: in progress  2.  Pt to demo pain free lumbar ROM to be [facility]  Goal status: in progress  3.  Pt to demo ability for functional bend,  lift, squat with optimal mechanics, to improve ability and back pain with IADLs.   Goal status: in progress  4.  Pt to report return to exercise for at least 30 min with pain 0-1/10 in low back.   Goal status: in progress    PLAN:  PT FREQUENCY: 1-2x/week  PT DURATION: 8 weeks  PLANNED INTERVENTIONS: Therapeutic exercises, Therapeutic activity, Neuromuscular re-education, Patient/Family education, Self Care, Joint mobilization, Joint manipulation, Stair training, Orthotic/Fit training, DME instructions, Aquatic Therapy, Dry Needling, Electrical stimulation,  Cryotherapy, Moist heat, Taping, Ultrasound, Ionotophoresis 4mg /ml Dexamethasone, Manual therapy,  Vasopneumatic device, Traction, Spinal manipulation, Spinal mobilization,Balance training, Gait training,   PLAN FOR NEXT SESSION:    Sedalia Muta, PT, DPT 10:05 AM  11/09/22

## 2022-11-10 ENCOUNTER — Ambulatory Visit (INDEPENDENT_AMBULATORY_CARE_PROVIDER_SITE_OTHER): Payer: BC Managed Care – PPO

## 2022-11-10 ENCOUNTER — Ambulatory Visit: Payer: BC Managed Care – PPO | Admitting: Family Medicine

## 2022-11-10 ENCOUNTER — Encounter: Payer: Self-pay | Admitting: Family Medicine

## 2022-11-10 ENCOUNTER — Other Ambulatory Visit: Payer: Self-pay

## 2022-11-10 VITALS — BP 130/90 | HR 79 | Ht 69.0 in | Wt 170.0 lb

## 2022-11-10 DIAGNOSIS — M25512 Pain in left shoulder: Secondary | ICD-10-CM

## 2022-11-10 DIAGNOSIS — M25531 Pain in right wrist: Secondary | ICD-10-CM | POA: Diagnosis not present

## 2022-11-10 DIAGNOSIS — M5442 Lumbago with sciatica, left side: Secondary | ICD-10-CM

## 2022-11-10 DIAGNOSIS — M5441 Lumbago with sciatica, right side: Secondary | ICD-10-CM

## 2022-11-10 DIAGNOSIS — G8929 Other chronic pain: Secondary | ICD-10-CM

## 2022-11-10 NOTE — Patient Instructions (Signed)
Trigger point injections See you again in 6-8 weeks

## 2022-11-10 NOTE — Assessment & Plan Note (Signed)
Trying to do the injections again today.  Continues to have significant pain and normal size more increasing weakness in the lower left shoulder.  Has failed all other conservative therapy including formal physical therapy at this time.  Do feel an MRI would be beneficial with this been going on greater than 4 years and now with associated weakness.  Depending on findings we will discuss other surgical interventions if necessary or other medical interventions.  Patient did recently have a CT done for his chest that was unremarkable.

## 2022-11-15 ENCOUNTER — Encounter: Payer: BC Managed Care – PPO | Admitting: Physical Therapy

## 2022-11-21 ENCOUNTER — Encounter: Payer: Self-pay | Admitting: Family Medicine

## 2022-11-21 ENCOUNTER — Encounter: Payer: Self-pay | Admitting: Internal Medicine

## 2022-11-22 ENCOUNTER — Encounter: Payer: Self-pay | Admitting: Physical Therapy

## 2022-11-22 ENCOUNTER — Ambulatory Visit (INDEPENDENT_AMBULATORY_CARE_PROVIDER_SITE_OTHER): Payer: BC Managed Care – PPO | Admitting: Physical Therapy

## 2022-11-22 DIAGNOSIS — M5459 Other low back pain: Secondary | ICD-10-CM

## 2022-11-22 NOTE — Therapy (Signed)
OUTPATIENT PHYSICAL THERAPY TREATMENT   Patient Name: Edward [name] MRN: [id number] DOB:[birth year], 47 y.o., male Today's Date: 11/22/2022   END OF SESSION:  PT End of Session - 11/22/22 1247     Visit Number 9    Number of Visits 16    Date for PT Re-Evaluation 12/07/22    Authorization Type [employer]BS    PT Start Time 0803    PT Stop Time 0851    PT Time Calculation (min) 48 min    Activity Tolerance Patient tolerated treatment well    Behavior During Therapy WFL for tasks assessed/performed                   Past Medical History:  Diagnosis Date   Arthritis    OA right knee   Family history of prostate cancer    Kidney stones    has a horse shoe kidney   Rectal cancer (HCC)    Thyroid disease    History reviewed. No pertinent surgical history. Patient Active Problem List   Diagnosis Date Noted   Right wrist pain 07/28/2022   Midline back pain 06/08/2022   Right carpal tunnel syndrome 04/25/2022   Adjustment disorder 11/04/2021   Skin mole 08/11/2021   Extensor carpi ulnaris tendinitis 06/01/2021   Routine general medical examination at a health care facility 01/05/2021   Genetic testing 12/28/2020   [facility] joint pain 11/24/2020   Rectal cancer (HCC) 09/22/2020   Insomnia 09/22/2020   Hypothyroid 09/22/2020   Nonallopathic lesion of thoracic region 11/25/2018   Nonallopathic lesion of sacral region 11/25/2018   Nonallopathic lesion of lumbosacral region 11/25/2018   Nonallopathic lesion of rib cage 11/25/2018   Nonallopathic lesion of cervical region 11/25/2018   Trigger point of left shoulder region 08/29/2018   Patellofemoral arthralgia of right knee 07/25/2018   Scapular dyskinesis 07/25/2018     PCP: Hillard Danker  REFERRING PROVIDER:  Terrilee Files   REFERRING DIAG: Low back pain  THERAPY DIAG:  Other low back pain  Rationale for Evaluation and Treatment: Rehabilitation  ONSET DATE:    SUBJECTIVE:   SUBJECTIVE STATEMENT Pt states  ongoing back pain, without significant improvement. He is having lower pain levels overall, and some days that are better than others. He also reports more soreness/ongoing in L shoulder blade, and will be having MRI for this soon. He has been riding his bike more and also has been more compliant with HEP.   Eval: Pt states new onset of back pain. Reports that it Could have started maybe about 2 years ago mildly.  After surgery this past March(3/24) he noticed increased pain .  Notes that Sitting too long causes  increased numbness in legs, especially when sitting on toilet.  Standing/walking worst pain.  Back: sore/pain  up to 8/10 at times. Was quite bad about 1 week ago. Denies any radicular pain now or in the past few months.  May/June, had injections, did have some relief after 2nd one.  Other:  R torn tendon in wrist Working full time  [employer], not doing at this time.  Has just finished cancer treatment with 2 surgeries. Feeling good, eating, sleeping well, Feels tired, some fatigue, not back to regular energy or activity yet.    PERTINENT HISTORY: Recent rectal cancer, surgery x 2 and chemo.   PAIN:  Are you having pain? Yes: NPRS scale: 4 /10 Pain location: low back/L5/S1/Central  Pain description: Sore, stiff  Aggravating factors: sitting, standing  Relieving factors:  none stated   PRECAUTIONS: None  WEIGHT BEARING RESTRICTIONS: No  FALLS:  Has patient fallen in last 6 months? No  PLOF: Independent  PATIENT GOALS:  Decreased pain   NEXT MD VISIT:   OBJECTIVE: updated 11/09/22  DIAGNOSTIC FINDINGS:   IMPRESSION: Mild disc and facet degeneration in the mid and lower lumbar spine with up to mild lateral recess and neural foraminal stenosis as above.   PATIENT SURVEYS:  Need FOTO   COGNITION: Overall cognitive status: Within functional limits for tasks assessed     SENSATION: WFL  POSTURE:    No Significant postural limitations  PALPATION:  Tenderness  in central lumbar spine, increased at L4/5,  No pain in SIJ, Tightness in bil HS  L>R; Tightness in bil thoracic paraspinals.   LOWER EXTREMITY ROM:  Lumbar: Flex: WFL,  Ext: mild limitation (pain) ,  SB: WFL bil;   Active ROM Left eval Right 11/09/22  Hip flexion wfl WFL  Hip extension wfl wfl  Hip abduction    Hip adduction    Hip internal rotation    Hip external rotation wfl Mild limitation  Knee flexion    Knee extension    Ankle dorsiflexion    Ankle plantarflexion    Ankle inversion    Ankle eversion     (Blank rows = not tested)  LOWER EXTREMITY MMT:  MMT Left eval Right  eval  Hip flexion 4+ 4+  Hip extension    Hip abduction 4+ 4+  Hip adduction    Hip internal rotation    Hip external rotation    Knee flexion 5 5  Knee extension 5 5  Ankle dorsiflexion    Ankle plantarflexion    Ankle inversion    Ankle eversion     (Blank rows = not tested)  LOWER EXTREMITY SPECIAL TESTS:  Neg SLR today   GAIT: unremarkable    TODAY'S TREATMENT:                                                                                                                              DATE:   11/22/2022 Therapeutic Exercise: Aerobic:  Supine:  SL bridging  x 5 bil;  Review of TA vs crunch for abdominal diastasis,  Quadruped:  bird dog, 2 x 10 (LE s only- shoulder pain)  Seated: Standing:  Review for shoulder ER, GTB 2 x 10;  Stretches:  Cat/cow x 10:   Neuromuscular Re-education: Manual Therapy:    light PA mobs, thoracic PA mobs, long leg distraction bil for lumbar;  DTM bil lumbar paraspinals,  Therapeutic Activity: Self Care:    Therapeutic Exercise: Aerobic: Supine:   bridging x 15 ;   Prone: hip ext 2 x 10 ;  press ups to elbows x 12 (sore)  Seated: Standing:  sit to stand x 10,  squats x 10 with education on mechanics ;  Squat/pick up from 12 in height 20lb x 6, from ground  x 5;  Stretches:  Seated HSS 30 sec x 3 bil;   LTR x 10;  Seated Piriformis 30 sec x 3  bil;  pelvic tilts x 15;  cat cow x 15;  Neuromuscular Re-education: Manual Therapy:   Therapeutic Activity: Self Care:   Previous:  Therapeutic Exercise: Aerobic: Supine: Supine march with TA 2 x 10;    bridging x 15 ;  SLR with TA 2 x 10 bil  Prone: hip ext 2 x 10 ; press ups to elbows x 12 (sore)  Seated: Standing: Stretches:  Seated HSS 30 sec x 3 bil;   LTR x 10;  Seated Piriformis 30 sec x 3 bil;  pelvic tilts x 15;  cat cow x 15;  Neuromuscular Re-education: Manual Therapy:   Therapeutic Activity: Self Care:   PATIENT EDUCATION:  Education details: reviewed HEP Person educated: Patient Education method: Programmer, multimedia, Demonstration, Tactile cues, Verbal cues, and Handouts Education comprehension: verbalized understanding, returned demonstration, verbal cues required, tactile cues required, and needs further education   HOME EXERCISE PROGRAM: Access Code: [email or web address]    ASSESSMENT:  CLINICAL IMPRESSION: Pt with continued soreness in back, seems to have increased pain with longer duration standing or days where he is on his feet more. He is improving with core activation, but continues to have deficits here, that will benefit from continued/focused stabilization for core. Discussed TA vs crunch motion for his abdominal muscle separation today. Reviewed and updated HEP today. Pt to benefit from dry needling trial in next couple visits, for decreasing back pain. We discussed need to continue core strength and general physical activity for overall improvements. We will continue 1/wk for 2-3 more weeks for lumbar pain relief. We also reviewed Shoulder rtc strength today which will be helpful for shoulder.    Eval: Patient presents with primary complaint of increased pain in low back.  He has most pain in central low lumbar region. Pt with poor movement quality and ability for functional activity due to pain and fear of pain. He has decreased ability for bending, sitting, as well  as standing and IADLS.  Pt will benefit from education on HEP for management and progressing to pain free increase in physical activity/exercise.  Pt will  benefit from skilled PT to improve deficits and pain and to return to PLOF.   OBJECTIVE IMPAIRMENTS: decreased activity tolerance, decreased mobility, decreased ROM, decreased strength, increased muscle spasms, improper body mechanics, and pain.   ACTIVITY LIMITATIONS: bending, sitting, standing, squatting, stairs, transfers, hygiene/grooming, and locomotion level  PARTICIPATION LIMITATIONS: cleaning, laundry, shopping, community activity, occupation, and yard work  PERSONAL FACTORS: Time since onset of injury/illness/exacerbation are also affecting patient's functional outcome.   REHAB POTENTIAL: Good  CLINICAL DECISION MAKING: Stable/uncomplicated  EVALUATION COMPLEXITY: Low   GOALS: Goals reviewed with patient? Yes  SHORT TERM GOALS: Target date: 09/21/2022   Pt to be independent with initial HEP  Goal status: MET    LONG TERM GOALS: Target date: 12/07/2022   Pt to be independent with final HEP  Goal status: in progress  2.  Pt to demo pain free lumbar ROM to be [employer] [facility]  Goal status: in progress  3.  Pt to demo ability for functional bend, lift, squat with optimal mechanics, to improve ability and back pain with IADLs.   Goal status: in progress  4.  Pt to report return to exercise for at least 30 min with pain 0-1/10 in low back.   Goal status: in progress  PLAN:  PT FREQUENCY: 1-2x/week  PT DURATION: 8 weeks  PLANNED INTERVENTIONS: Therapeutic exercises, Therapeutic activity, Neuromuscular re-education, Patient/Family education, Self Care, Joint mobilization, Joint manipulation, Stair training, Orthotic/Fit training, DME instructions, Aquatic Therapy, Dry Needling, Electrical stimulation, Cryotherapy, Moist heat, Taping, Ultrasound, Ionotophoresis 4mg /ml Dexamethasone, Manual therapy,  Vasopneumatic  device, Traction, Spinal manipulation, Spinal mobilization,Balance training, Gait training,   PLAN FOR NEXT SESSION:    Sedalia Muta, PT, DPT 12:48 PM  11/22/22

## 2022-11-30 ENCOUNTER — Ambulatory Visit: Payer: BC Managed Care – PPO

## 2022-11-30 DIAGNOSIS — Z23 Encounter for immunization: Secondary | ICD-10-CM | POA: Diagnosis not present

## 2022-11-30 NOTE — Progress Notes (Signed)
Please sign off as MD is out of office pt received vaccine and responded well to vaccine

## 2022-12-01 ENCOUNTER — Ambulatory Visit (INDEPENDENT_AMBULATORY_CARE_PROVIDER_SITE_OTHER): Payer: BC Managed Care – PPO | Admitting: Physical Therapy

## 2022-12-01 DIAGNOSIS — M5459 Other low back pain: Secondary | ICD-10-CM

## 2022-12-01 NOTE — Therapy (Signed)
OUTPATIENT PHYSICAL THERAPY TREATMENT   Patient Name: Edward [name] MRN: [id number] DOB:[birth year], 47 y.o., male Today's Date: 12/01/2022   END OF SESSION:  PT End of Session - 12/02/22 1123     Visit Number 10    Number of Visits 16    Date for PT Re-Evaluation 12/07/22    Authorization Type BCBS    PT Start Time 0806    PT Stop Time 0846    PT Time Calculation (min) 40 min    Activity Tolerance Patient tolerated treatment well    Behavior During Therapy WFL for tasks assessed/performed                    Past Medical History:  Diagnosis Date   Arthritis    OA right knee   Family history of prostate cancer    Kidney stones    has a horse shoe kidney   Rectal cancer (HCC)    Thyroid disease    History reviewed. No pertinent surgical history. Patient Active Problem List   Diagnosis Date Noted   Right wrist pain 07/28/2022   Midline back pain 06/08/2022   Right carpal tunnel syndrome 04/25/2022   Adjustment disorder 11/04/2021   Skin mole 08/11/2021   Extensor carpi ulnaris tendinitis 06/01/2021   Routine general medical examination at a health care facility 01/05/2021   Genetic testing 12/28/2020   [facility] joint pain 11/24/2020   Rectal cancer (HCC) 09/22/2020   Insomnia 09/22/2020   Hypothyroid 09/22/2020   Nonallopathic lesion of thoracic region 11/25/2018   Nonallopathic lesion of sacral region 11/25/2018   Nonallopathic lesion of lumbosacral region 11/25/2018   Nonallopathic lesion of rib cage 11/25/2018   Nonallopathic lesion of cervical region 11/25/2018   Trigger point of left shoulder region 08/29/2018   Patellofemoral arthralgia of right knee 07/25/2018   Scapular dyskinesis 07/25/2018     PCP: Hillard Danker  REFERRING PROVIDER:  Terrilee Files   REFERRING DIAG: Low back pain  THERAPY DIAG:  Other low back pain  Rationale for Evaluation and Treatment: Rehabilitation  ONSET DATE:    SUBJECTIVE:   SUBJECTIVE STATEMENT Pt  states ongoing back pain, states mostly constant soreness. Has not had significant increase in pain overall, but still has daily soreness. Has been able to ride his bike as well as do HEP. He is getting tingling into LEs (was better a couple weeks ago, but has been feeling it again) only when he is doing to the bathroom. Having shoulder MRI tomorrow.   Eval: Pt states new onset of back pain. Reports that it Could have started maybe about 2 years ago mildly.  After surgery this past March(3/24) he noticed increased pain .  Notes that Sitting too long causes  increased numbness in legs, especially when sitting on toilet.  Standing/walking worst pain.  Back: sore/pain  up to 8/10 at times. Was quite bad about 1 week ago. Denies any radicular pain now or in the past few months.  May/June, had injections, did have some relief after 2nd one.  Other:  R torn tendon in wrist Working full time  [employer], not doing at this time.  Has just finished cancer treatment with 2 surgeries. Feeling good, eating, sleeping well, Feels tired, some fatigue, not back to regular energy or activity yet.    PERTINENT HISTORY: Recent rectal cancer, surgery x 2 and chemo.   PAIN:  Are you having pain? Yes: NPRS scale: 4 /10 Pain location: low back/L5/S1/Central  Pain description:  Sore, stiff  Aggravating factors: sitting, standing  Relieving factors: none stated   PRECAUTIONS: None  WEIGHT BEARING RESTRICTIONS: No  FALLS:  Has patient fallen in last 6 months? No  PLOF: Independent  PATIENT GOALS:  Decreased pain   NEXT MD VISIT:   OBJECTIVE: updated 11/09/22  DIAGNOSTIC FINDINGS:   IMPRESSION: Mild disc and facet degeneration in the mid and lower lumbar spine with up to mild lateral recess and neural foraminal stenosis as above.   PATIENT SURVEYS:  Need FOTO   COGNITION: Overall cognitive status: Within functional limits for tasks assessed     SENSATION: WFL  POSTURE:    No Significant  postural limitations  PALPATION:  Tenderness in central lumbar spine, increased at L4/5,  No pain in SIJ, Tightness in bil HS  L>R; Tightness in bil thoracic paraspinals.   LOWER EXTREMITY ROM:  Lumbar: Flex: WFL,  Ext: mild limitation (pain) ,  SB: WFL bil;   Active ROM Left eval Right 11/09/22  Hip flexion wfl WFL  Hip extension wfl wfl  Hip abduction    Hip adduction    Hip internal rotation    Hip external rotation wfl Mild limitation  Knee flexion    Knee extension    Ankle dorsiflexion    Ankle plantarflexion    Ankle inversion    Ankle eversion     (Blank rows = not tested)  LOWER EXTREMITY MMT:  MMT Left eval Right  eval  Hip flexion 4+ 4+  Hip extension    Hip abduction 4+ 4+  Hip adduction    Hip internal rotation    Hip external rotation    Knee flexion 5 5  Knee extension 5 5  Ankle dorsiflexion    Ankle plantarflexion    Ankle inversion    Ankle eversion     (Blank rows = not tested)  LOWER EXTREMITY SPECIAL TESTS:  Neg SLR today   GAIT: unremarkable    TODAY'S TREATMENT:                                                                                                                              DATE:   12/02/2022 Therapeutic Exercise: Aerobic:  Supine:  bridging x 15;  Quadruped:  bird dog, 2 x 10 ; planks 30 sec x 4;  Seated: Standing:  Stretches:  Cat/cow x 10:   Neuromuscular Re-education: Manual Therapy:    light PA mobs, thoracic PA mobs, long leg distraction bil for lumbar; STM/DTM to bil low lumbar Trigger Point Dry-Needling  Treatment instructions: Expect mild to moderate muscle soreness. S/S of pneumothorax if dry needled over a lung field, and to seek immediate medical attention should they occur. Patient verbalized understanding of these instructions and education.  Patient Consent Given: Yes Education handout provided: Previously provided Muscles treated: bil lumbar multifidi Electrical stimulation performed: No Parameters:  N/A Treatment response/outcome:    Therapeutic Exercise: Aerobic: Supine:   bridging x 15 ;  Prone: hip ext 2 x 10 ;  press ups to elbows x 12 (sore)  Seated: Standing:  sit to stand x 10,  squats x 10 with education on mechanics ;  Squat/pick up from 12 in height 20lb x 6, from ground x 5;  Stretches:  Seated HSS 30 sec x 3 bil;   LTR x 10;  Seated Piriformis 30 sec x 3 bil;  pelvic tilts x 15;  cat cow x 15;  Neuromuscular Re-education: Manual Therapy:   Therapeutic Activity: Self Care:   Previous:  Therapeutic Exercise: Aerobic: Supine: Supine march with TA 2 x 10;    bridging x 15 ;  SLR with TA 2 x 10 bil  Prone: hip ext 2 x 10 ; press ups to elbows x 12 (sore)  Seated: Standing: Stretches:  Seated HSS 30 sec x 3 bil;   LTR x 10;  Seated Piriformis 30 sec x 3 bil;  pelvic tilts x 15;  cat cow x 15;  Neuromuscular Re-education: Manual Therapy:   Therapeutic Activity: Self Care:   PATIENT EDUCATION:  Education details: reviewed HEP Person educated: Patient Education method: Explanation, Demonstration, Tactile cues, Verbal cues, and Handouts Education comprehension: verbalized understanding, returned demonstration, verbal cues required, tactile cues required, and needs further education   HOME EXERCISE PROGRAM: Access Code: [email or web address]    ASSESSMENT:  CLINICAL IMPRESSION: Pt with good tolerance for manual and dry needling today. Reviewed best ther ex and core strength. He continues to be challenged with core strength. Needling tried today for attempts for pain relief. Plan to progress as tolerated.   Eval: Pt with continued soreness in back, seems to have increased pain with longer duration standing or days where he is on his feet more. He is improving with core activation, but continues to have deficits here, that will benefit from continued/focused stabilization for core. Discussed TA vs crunch motion for his abdominal muscle separation today. Reviewed and updated  HEP today. Pt to benefit from dry needling trial in next couple visits, for decreasing back pain. We discussed need to continue core strength and general physical activity for overall improvements. We will continue 1/wk for 2-3 more weeks for lumbar pain relief. We also reviewed Shoulder rtc strength today which will be helpful for shoulder.    Eval: Patient presents with primary complaint of increased pain in low back.  He has most pain in central low lumbar region. Pt with poor movement quality and ability for functional activity due to pain and fear of pain. He has decreased ability for bending, sitting, as well as standing and IADLS.  Pt will benefit from education on HEP for management and progressing to pain free increase in physical activity/exercise.  Pt will  benefit from skilled PT to improve deficits and pain and to return to PLOF.   OBJECTIVE IMPAIRMENTS: decreased activity tolerance, decreased mobility, decreased ROM, decreased strength, increased muscle spasms, improper body mechanics, and pain.   ACTIVITY LIMITATIONS: bending, sitting, standing, squatting, stairs, transfers, hygiene/grooming, and locomotion level  PARTICIPATION LIMITATIONS: cleaning, laundry, shopping, community activity, occupation, and yard work  PERSONAL FACTORS: Time since onset of injury/illness/exacerbation are also affecting patient's functional outcome.   REHAB POTENTIAL: Good  CLINICAL DECISION MAKING: Stable/uncomplicated  EVALUATION COMPLEXITY: Low   GOALS: Goals reviewed with patient? Yes  SHORT TERM GOALS: Target date: 09/21/2022   Pt to be independent with initial HEP  Goal status: MET    LONG TERM GOALS: Target date: 12/07/2022   Pt to be  independent with final HEP  Goal status: in progress  2.  Pt to demo pain free lumbar ROM to be [facility]  Goal status: in progress  3.  Pt to demo ability for functional bend, lift, squat with optimal mechanics, to improve ability and back pain with  IADLs.   Goal status: in progress  4.  Pt to report return to exercise for at least 30 min with pain 0-1/10 in low back.   Goal status: in progress    PLAN:  PT FREQUENCY: 1-2x/week  PT DURATION: 8 weeks  PLANNED INTERVENTIONS: Therapeutic exercises, Therapeutic activity, Neuromuscular re-education, Patient/Family education, Self Care, Joint mobilization, Joint manipulation, Stair training, Orthotic/Fit training, DME instructions, Aquatic Therapy, Dry Needling, Electrical stimulation, Cryotherapy, Moist heat, Taping, Ultrasound, Ionotophoresis 4mg /ml Dexamethasone, Manual therapy,  Vasopneumatic device, Traction, Spinal manipulation, Spinal mobilization,Balance training, Gait training,   PLAN FOR NEXT SESSION:    Sedalia Muta, PT, DPT 11:24 AM  12/02/22

## 2022-12-02 ENCOUNTER — Encounter: Payer: Self-pay | Admitting: Physical Therapy

## 2022-12-02 ENCOUNTER — Ambulatory Visit
Admission: RE | Admit: 2022-12-02 | Discharge: 2022-12-02 | Disposition: A | Discharge status: Home / Self Care | Payer: BC Managed Care – PPO | Source: Ambulatory Visit | Attending: Family Medicine | Admitting: Family Medicine

## 2022-12-02 DIAGNOSIS — G8929 Other chronic pain: Secondary | ICD-10-CM

## 2022-12-08 ENCOUNTER — Encounter: Payer: Self-pay | Admitting: Physical Therapy

## 2022-12-08 ENCOUNTER — Ambulatory Visit: Payer: BC Managed Care – PPO | Admitting: Physical Therapy

## 2022-12-08 DIAGNOSIS — M5459 Other low back pain: Secondary | ICD-10-CM | POA: Diagnosis not present

## 2022-12-08 NOTE — Therapy (Signed)
OUTPATIENT PHYSICAL THERAPY TREATMENT   Patient Name: Lyndal Tipton MRN: [id number] DOB:29-Nov-1975, 47 y.o., male Today's Date: 12/08/2022   END OF SESSION:  PT End of Session - 12/08/22 0804     Visit Number 11    Number of Visits 20    Date for PT Re-Evaluation 01/19/23    Authorization Type [employer]BS   recert done at visit 11.    PT Start Time 0804    PT Stop Time 0845    PT Time Calculation (min) 41 min    Activity Tolerance Patient tolerated treatment well    Behavior During Therapy WFL for tasks assessed/performed                    Past Medical History:  Diagnosis Date   Arthritis    OA right knee   Family history of prostate cancer    Kidney stones    has a horse shoe kidney   Rectal cancer (HCC)    Thyroid disease    History reviewed. No pertinent surgical history. Patient Active Problem List   Diagnosis Date Noted   Right wrist pain 07/28/2022   Midline back pain 06/08/2022   Right carpal tunnel syndrome 04/25/2022   Adjustment disorder 11/04/2021   Skin mole 08/11/2021   Extensor carpi ulnaris tendinitis 06/01/2021   Routine general medical examination at a health care facility 01/05/2021   Genetic testing 12/28/2020   [facility] joint pain 11/24/2020   Rectal cancer (HCC) 09/22/2020   Insomnia 09/22/2020   Hypothyroid 09/22/2020   Nonallopathic lesion of thoracic region 11/25/2018   Nonallopathic lesion of sacral region 11/25/2018   Nonallopathic lesion of lumbosacral region 11/25/2018   Nonallopathic lesion of rib cage 11/25/2018   Nonallopathic lesion of cervical region 11/25/2018   Trigger point of left shoulder region 08/29/2018   Patellofemoral arthralgia of right knee 07/25/2018   Scapular dyskinesis 07/25/2018     PCP: Hillard Danker  REFERRING PROVIDER:  Terrilee Files   REFERRING DIAG: Low back pain  THERAPY DIAG:  Other low back pain  Rationale for Evaluation and Treatment: Rehabilitation  ONSET DATE:    SUBJECTIVE:    SUBJECTIVE STATEMENT Pt states ongoing back pain, states mostly constant soreness in central, low back.   Eval: Pt states new onset of back pain. Reports that it Could have started maybe about 2 years ago mildly.  After surgery this past March(3/24) he noticed increased pain .  Notes that Sitting too long causes  increased numbness in legs, especially when sitting on toilet.  Standing/walking worst pain.  Back: sore/pain  up to 8/10 at times. Was quite bad about 1 week ago. Denies any radicular pain now or in the past few months.  May/June, had injections, did have some relief after 2nd one.  Other:  R torn tendon in wrist Working full time  [employer], not doing at this time.  Has just finished cancer treatment with 2 surgeries. Feeling good, eating, sleeping well, Feels tired, some fatigue, not back to regular energy or activity yet.    PERTINENT HISTORY: Recent rectal cancer, surgery x 2 and chemo.   PAIN:  Are you having pain? Yes: NPRS scale: 4 /10 Pain location: low back/L5/S1/Central  Pain description: Sore, stiff  Aggravating factors: sitting, standing  Relieving factors: none stated   PRECAUTIONS: None  WEIGHT BEARING RESTRICTIONS: No  FALLS:  Has patient fallen in last 6 months? No  PLOF: Independent  PATIENT GOALS:  Decreased pain   NEXT  MD VISIT:   OBJECTIVE: updated 12/08/22  DIAGNOSTIC FINDINGS:   IMPRESSION: Mild disc and facet degeneration in the mid and lower lumbar spine with up to mild lateral recess and neural foraminal stenosis as above.   PATIENT SURVEYS:  Need FOTO   COGNITION: Overall cognitive status: Within functional limits for tasks assessed     SENSATION: WFL  POSTURE:    No Significant postural limitations  PALPATION:  Tenderness in central lumbar spine, increased at L4/5,  No pain in SIJ, Tightness in bil HS  L>R; Tightness in bil thoracic paraspinals.   LOWER EXTREMITY ROM:  Lumbar: Flex: WFL,  Ext: mild limitation  (pain) ,  SB: WFL bil;   Active ROM Left eval Right 11/09/22  Hip flexion wfl [facility]  Hip extension wfl wfl  Hip abduction    Hip adduction    Hip internal rotation    Hip external rotation wfl Mild limitation  Knee flexion    Knee extension    Ankle dorsiflexion    Ankle plantarflexion    Ankle inversion    Ankle eversion     (Blank rows = not tested)  LOWER EXTREMITY MMT:  MMT Left 12/08/22 Right  12/08/22  Hip flexion 4+ 4+  Hip extension    Hip abduction 4+ 4+  Hip adduction    Hip internal rotation    Hip external rotation    Knee flexion 5 5  Knee extension 5 5  Ankle dorsiflexion    Ankle plantarflexion    Ankle inversion    Ankle eversion     (Blank rows = not tested)  LOWER EXTREMITY SPECIAL TESTS:  Neg SLR today   GAIT: unremarkable    TODAY'S TREATMENT:                                                                                                                              DATE:   12/08/2022 Therapeutic Exercise: Aerobic:  Supine:   Quadruped:  cat cow x 15; bird dog, 2 x 10 ; planks 30 sec x 4;  Seated: Standing: squats 2 x 10- cueing for hip, knee ankle position, decreased back pain after cueing.  Stretches:   Neuromuscular Re-education: Manual Therapy:    light PA mobs, long leg distraction bil for lumbar; DTM/TPR to bil low lumbar paraspinals and into L QL   Therapeutic Exercise: Aerobic: Supine:   bridging x 15 ;   Prone: hip ext 2 x 10 ;  press ups to elbows x 12 (sore)  Seated: Standing:  sit to stand x 10,  squats x 10 with education on mechanics ;  Squat/pick up from 12 in height 20lb x 6, from ground x 5;  Stretches:  Seated HSS 30 sec x 3 bil;   LTR x 10;  Seated Piriformis 30 sec x 3 bil;  pelvic tilts x 15;  cat cow x 15;  Neuromuscular Re-education: Manual Therapy:   Therapeutic Activity: Self Care:  Previous:  Therapeutic Exercise: Aerobic: Supine: Supine march with TA 2 x 10;    bridging x 15 ;  SLR with TA 2 x 10  bil  Prone: hip ext 2 x 10 ; press ups to elbows x 12 (sore)  Seated: Standing: Stretches:  Seated HSS 30 sec x 3 bil;   LTR x 10;  Seated Piriformis 30 sec x 3 bil;  pelvic tilts x 15;  cat cow x 15;  Neuromuscular Re-education: Manual Therapy:   Therapeutic Activity: Self Care:   PATIENT EDUCATION:  Education details: reviewed HEP Person educated: Patient Education method: Explanation, Demonstration, Tactile cues, Verbal cues, and Handouts Education comprehension: verbalized understanding, returned demonstration, verbal cues required, tactile cues required, and needs further education   HOME EXERCISE PROGRAM: Access Code: [email or web address]   ASSESSMENT:  CLINICAL IMPRESSION: Pt continues to have ongoing pain. He has most soreness in central low lumbar region, increased with lumbar extension ROM as well as prolonged standing and walking. He continues to state a constant low level soreness most of the time. He has had some improvements in pain overall, and is doing better with functional activity and with HEP compliance. He has also had injections, but still without significant reduction of pain. Pt may benefit from f/u appt with MD to discuss other pain relief options. We also discussed need to continue mobility daily, as well as focus on core strength and stability. Pt to benefit from 1-2 more visits, to finalize HEP and d/c plan.    Eval: Patient presents with primary complaint of increased pain in low back.  He has most pain in central low lumbar region. Pt with poor movement quality and ability for functional activity due to pain and fear of pain. He has decreased ability for bending, sitting, as well as standing and IADLS.  Pt will benefit from education on HEP for management and progressing to pain free increase in physical activity/exercise.  Pt will  benefit from skilled PT to improve deficits and pain and to return to PLOF.   OBJECTIVE IMPAIRMENTS: decreased activity tolerance,  decreased mobility, decreased ROM, decreased strength, increased muscle spasms, improper body mechanics, and pain.   ACTIVITY LIMITATIONS: bending, sitting, standing, squatting, stairs, transfers, hygiene/grooming, and locomotion level  PARTICIPATION LIMITATIONS: cleaning, laundry, shopping, community activity, occupation, and yard work  PERSONAL FACTORS: Time since onset of injury/illness/exacerbation are also affecting patient's functional outcome.   REHAB POTENTIAL: Good  CLINICAL DECISION MAKING: Stable/uncomplicated  EVALUATION COMPLEXITY: Low   GOALS: Goals reviewed with patient? Yes  SHORT TERM GOALS: Target date: 09/21/2022   Pt to be independent with initial HEP  Goal status: MET    LONG TERM GOALS: Target date: 01/19/23   Pt to be independent with final HEP  Goal status: in progress  2.  Pt to demo pain free lumbar ROM to be [facility]  Goal status: in progress  3.  Pt to demo ability for functional bend, lift, squat with optimal mechanics, to improve ability and back pain with IADLs.   Goal status: in progress  4.  Pt to report return to exercise for at least 30 min with pain 0-1/10 in low back.   Goal status: in progress    PLAN:  PT FREQUENCY: 1-2x/week  PT DURATION: 6 weeks  PLANNED INTERVENTIONS: Therapeutic exercises, Therapeutic activity, Neuromuscular re-education, Patient/Family education, Self Care, Joint mobilization, Joint manipulation, Stair training, Orthotic/Fit training, DME instructions, Aquatic Therapy, Dry Needling, Electrical stimulation, Cryotherapy, Moist heat, Taping, Ultrasound, Ionotophoresis 4mg /ml Dexamethasone, Manual  therapy,  Vasopneumatic device, Traction, Spinal manipulation, Spinal mobilization,Balance training, Gait training,   PLAN FOR NEXT SESSION:    Sedalia Muta, PT, DPT 11:12 AM  12/08/22

## 2022-12-12 ENCOUNTER — Encounter: Payer: Self-pay | Admitting: Family Medicine

## 2022-12-12 ENCOUNTER — Other Ambulatory Visit: Payer: Self-pay | Admitting: Family Medicine

## 2022-12-12 DIAGNOSIS — Z859 Personal history of malignant neoplasm, unspecified: Secondary | ICD-10-CM

## 2022-12-12 DIAGNOSIS — M9901 Segmental and somatic dysfunction of cervical region: Secondary | ICD-10-CM

## 2022-12-12 DIAGNOSIS — M542 Cervicalgia: Secondary | ICD-10-CM

## 2022-12-14 ENCOUNTER — Ambulatory Visit (INDEPENDENT_AMBULATORY_CARE_PROVIDER_SITE_OTHER): Payer: BC Managed Care – PPO | Admitting: Physical Therapy

## 2022-12-14 DIAGNOSIS — M5459 Other low back pain: Secondary | ICD-10-CM | POA: Diagnosis not present

## 2022-12-18 ENCOUNTER — Encounter: Payer: Self-pay | Admitting: Physical Therapy

## 2022-12-28 ENCOUNTER — Inpatient Hospital Stay
Admission: RE | Admit: 2022-12-28 | Discharge: 2022-12-28 | Discharge status: Home / Self Care | Payer: BC Managed Care – PPO | Source: Ambulatory Visit | Attending: Family Medicine | Admitting: Family Medicine

## 2022-12-28 ENCOUNTER — Ambulatory Visit
Admission: RE | Admit: 2022-12-28 | Discharge: 2022-12-28 | Disposition: A | Discharge status: Home / Self Care | Payer: BC Managed Care – PPO | Source: Ambulatory Visit | Attending: Family Medicine | Admitting: Family Medicine

## 2022-12-28 DIAGNOSIS — M542 Cervicalgia: Secondary | ICD-10-CM

## 2022-12-28 DIAGNOSIS — M9901 Segmental and somatic dysfunction of cervical region: Secondary | ICD-10-CM

## 2022-12-28 DIAGNOSIS — Z859 Personal history of malignant neoplasm, unspecified: Secondary | ICD-10-CM

## 2022-12-28 MED ORDER — GADOPICLENOL 0.5 MMOL/ML IV SOLN
7.0000 mL | Freq: Once | INTRAVENOUS | Status: AC | PRN
  Administered 2022-12-28: 7 mL via INTRAVENOUS

## 2022-12-28 NOTE — Progress Notes (Signed)
[name] [address] [place] [address] Phone: [phone] Subjective:   [name], am serving as a scribe for Dr. Antoine Primas.  I'm seeing this patient by the request  of:  Myrlene Broker, MD  CC: Back and neck pain follow-up  [id number]  Edward [name] is a 47 y.o. male coming in with complaint of back and neck pain. OMT 12/12/2022. Also f/u for X and would like injection today. Patient states same per usual. No new concerns. Go over MRI results.  Medications patient has been prescribed: Meloxicam  Taking:     Had MRI cervical but did show very mild arthritic changes with some mild protruding disc at the C6-C7 and C5-C6.  MRI of the shoulder was completely unremarkable at this moment.    Reviewed prior external information including notes and imaging from previsou exam, outside providers and external EMR if available.   As well as notes that were available from care everywhere and other healthcare systems.  Past medical history, social, surgical and family history all reviewed in electronic medical record.  No pertanent information unless stated regarding to the chief complaint.   Past Medical History:  Diagnosis Date   Arthritis    OA right knee   Family history of prostate cancer    Kidney stones    has a horse shoe kidney   Rectal cancer (HCC)    Thyroid disease     No Known Allergies   Review of Systems:  No headache, visual changes, nausea, vomiting, diarrhea, constipation, dizziness, abdominal pain, skin rash, fevers, chills, night sweats, weight loss, swollen lymph nodes, body aches, joint swelling, chest pain, shortness of breath, mood changes. POSITIVE muscle aches  Objective  Blood pressure 122/84, pulse 69, height 5\' 9"  (1.753 m), weight 170 lb (77.1 kg), SpO2 97%.   General: No apparent distress alert and oriented x3 mood and affect normal, dressed appropriately.  HEENT: Pupils equal,  extraocular movements intact  Respiratory: Patient's speak in full sentences and does not appear short of breath  Cardiovascular: No lower extremity edema, non tender, no erythema  MSK:  Back does have some loss lordosis noted.  Some tenderness to palpation of the paraspinal musculature.  Tightness with FABER left greater than right.  Negative straight leg test. Neck exam does have some loss lordosis noted.  After verbal consent patient was prepped with alcohol swab and with a 21-gauge 2 inch needle injected in the right sacroiliac joint with a total of 1 cc 0.5% Marcaine and 1 cc of Kenalog 40 mg/mL.  No blood loss.  Band-Aid placed.  Postinjection instructions given   After verbal consent patient was prepped with alcohol swab and with a 21-gauge 2 inch needle injected 1 into the right left sacroiliac joint with a total of 1 cc of 0.5% Marcaine and 1 cc of Kenalog 40 mg/mL.  Minimal blood loss.  Band-Aid placed.  Postinjection instructions given     Assessment and Plan:  Degenerative disc disease, cervical Patient does have a protruding disc noted, discussed icing regimen and home exercises, discussed with patient about potential epidural.  Patient would like to try it and see if it will help.  Has had 1 in the lower back previously with no significant improvement.  We discussed the possibility of further evaluation of any MRI of the thoracic spine with patient's past medical history significant for also rectal cancer.  Hopeful that patient will continue to make some improvement.  See how patient does with the changes we made today.  See how patient does with the epidural.     The above documentation has been reviewed and is accurate and complete Judi Saa, DO          Note: This dictation was prepared with Dragon dictation along with smaller phrase technology. Any transcriptional errors that result from this process are unintentional.

## 2022-12-29 ENCOUNTER — Encounter: Payer: Self-pay | Admitting: Family Medicine

## 2023-01-02 ENCOUNTER — Ambulatory Visit (INDEPENDENT_AMBULATORY_CARE_PROVIDER_SITE_OTHER): Payer: BC Managed Care – PPO | Admitting: Family Medicine

## 2023-01-02 VITALS — BP 122/84 | HR 69 | Ht 69.0 in | Wt 170.0 lb

## 2023-01-02 DIAGNOSIS — G8929 Other chronic pain: Secondary | ICD-10-CM

## 2023-01-02 DIAGNOSIS — M533 Sacrococcygeal disorders, not elsewhere classified: Secondary | ICD-10-CM

## 2023-01-02 DIAGNOSIS — M5030 Other cervical disc degeneration, unspecified cervical region: Secondary | ICD-10-CM

## 2023-01-02 NOTE — Assessment & Plan Note (Signed)
Patient does have a protruding disc noted, discussed icing regimen and home exercises, discussed with patient about potential epidural.  Patient would like to try it and see if it will help.  Has had 1 in the lower back previously with no significant improvement.  We discussed the possibility of further evaluation of any MRI of the thoracic spine with patient's past medical history significant for also rectal cancer.  Hopeful that patient will continue to make some improvement.  See how patient does with the changes we made today.  See how patient does with the epidural.

## 2023-01-02 NOTE — Assessment & Plan Note (Signed)
Patient given injections bilaterally today.  Discussed which activities to do and which ones to avoid.  Increase activity slowly otherwise.  Follow-up again in 6-8 weeks

## 2023-01-02 NOTE — Patient Instructions (Addendum)
Injection in SI joints today Good to see you!  2 weeks after epidural contact us and we may consider MRI in thoracic

## 2023-01-04 ENCOUNTER — Other Ambulatory Visit: Payer: Self-pay

## 2023-01-04 DIAGNOSIS — M542 Cervicalgia: Secondary | ICD-10-CM

## 2023-01-09 ENCOUNTER — Encounter: Payer: Self-pay | Admitting: Internal Medicine

## 2023-01-09 DIAGNOSIS — Z0000 Encounter for general adult medical examination without abnormal findings: Secondary | ICD-10-CM

## 2023-01-09 DIAGNOSIS — E039 Hypothyroidism, unspecified: Secondary | ICD-10-CM

## 2023-01-16 ENCOUNTER — Other Ambulatory Visit (INDEPENDENT_AMBULATORY_CARE_PROVIDER_SITE_OTHER): Payer: BC Managed Care – PPO

## 2023-01-16 ENCOUNTER — Encounter: Payer: Self-pay | Admitting: Gastroenterology

## 2023-01-16 DIAGNOSIS — E039 Hypothyroidism, unspecified: Secondary | ICD-10-CM | POA: Diagnosis not present

## 2023-01-16 DIAGNOSIS — Z0000 Encounter for general adult medical examination without abnormal findings: Secondary | ICD-10-CM

## 2023-01-16 LAB — LIPID PANEL
Cholesterol: 208 mg/dL — ABNORMAL HIGH (ref 0–200)
HDL: 50 mg/dL (ref >39.00)
LDL Cholesterol: 105 mg/dL — ABNORMAL HIGH (ref 0–99)
NonHDL: 158.25
Total CHOL/HDL Ratio: 4
Triglycerides: 264 mg/dL — ABNORMAL HIGH (ref 0.0–149.0)
VLDL: 52.8 mg/dL — ABNORMAL HIGH (ref 0.0–40.0)

## 2023-01-16 LAB — COMPREHENSIVE METABOLIC PANEL
ALT: 13 U/L (ref 0–53)
AST: 13 U/L (ref 0–37)
Albumin: 4.4 g/dL (ref 3.5–5.2)
Alkaline Phosphatase: 59 U/L (ref 39–117)
BUN: 15 mg/dL (ref 6–23)
CO2: 29 meq/L (ref 19–32)
Calcium: 9.2 mg/dL (ref 8.4–10.5)
Chloride: 104 meq/L (ref 96–112)
Creatinine, Ser: 0.82 mg/dL (ref 0.40–1.50)
GFR: 104.05 mL/min (ref >60.00)
Glucose, Bld: 86 mg/dL (ref 70–99)
Potassium: 4.1 meq/L (ref 3.5–5.1)
Sodium: 141 meq/L (ref 135–145)
Total Bilirubin: 0.8 mg/dL (ref 0.2–1.2)
Total Protein: 6.7 g/dL (ref 6.0–8.3)

## 2023-01-16 LAB — CBC
HCT: 44.5 % (ref 39.0–52.0)
Hemoglobin: 14.8 g/dL (ref 13.0–17.0)
MCHC: 33.2 g/dL (ref 30.0–36.0)
MCV: 94.6 fL (ref 78.0–100.0)
Platelets: 153 10*3/uL (ref 150.0–400.0)
RBC: 4.7 Mil/uL (ref 4.22–5.81)
RDW: 13.6 % (ref 11.5–15.5)
WBC: 6.1 10*3/uL (ref 4.0–10.5)

## 2023-01-16 LAB — TSH: TSH: 2.02 u[IU]/mL (ref 0.35–5.50)

## 2023-01-18 NOTE — Discharge Instructions (Signed)

## 2023-01-19 ENCOUNTER — Ambulatory Visit
Admission: RE | Admit: 2023-01-19 | Discharge: 2023-01-19 | Disposition: A | Discharge status: Home / Self Care | Payer: BC Managed Care – PPO | Source: Ambulatory Visit | Attending: Family Medicine | Admitting: Family Medicine

## 2023-01-19 ENCOUNTER — Other Ambulatory Visit: Payer: BC Managed Care – PPO

## 2023-01-19 DIAGNOSIS — M542 Cervicalgia: Secondary | ICD-10-CM

## 2023-01-19 MED ORDER — IOPAMIDOL (ISOVUE-M 300) INJECTION 61%
1.0000 mL | Freq: Once | INTRAMUSCULAR | Status: AC | PRN
  Administered 2023-01-19: 1 mL via EPIDURAL

## 2023-01-19 MED ORDER — TRIAMCINOLONE ACETONIDE 40 MG/ML IJ SUSP (RADIOLOGY)
60.0000 mg | Freq: Once | INTRAMUSCULAR | Status: AC
  Administered 2023-01-19: 60 mg via EPIDURAL

## 2023-01-20 ENCOUNTER — Ambulatory Visit (INDEPENDENT_AMBULATORY_CARE_PROVIDER_SITE_OTHER): Payer: BC Managed Care – PPO | Admitting: Internal Medicine

## 2023-01-20 ENCOUNTER — Encounter: Payer: Self-pay | Admitting: Internal Medicine

## 2023-01-20 VITALS — BP 138/88 | HR 62 | Temp 98.3°F | Ht 69.0 in | Wt 168.0 lb

## 2023-01-20 DIAGNOSIS — M549 Dorsalgia, unspecified: Secondary | ICD-10-CM

## 2023-01-20 DIAGNOSIS — F4323 Adjustment disorder with mixed anxiety and depressed mood: Secondary | ICD-10-CM

## 2023-01-20 DIAGNOSIS — C20 Malignant neoplasm of rectum: Secondary | ICD-10-CM | POA: Diagnosis not present

## 2023-01-20 DIAGNOSIS — R0683 Snoring: Secondary | ICD-10-CM | POA: Diagnosis not present

## 2023-01-20 DIAGNOSIS — E039 Hypothyroidism, unspecified: Secondary | ICD-10-CM

## 2023-01-20 DIAGNOSIS — Z0000 Encounter for general adult medical examination without abnormal findings: Secondary | ICD-10-CM | POA: Diagnosis not present

## 2023-01-20 DIAGNOSIS — M5030 Other cervical disc degeneration, unspecified cervical region: Secondary | ICD-10-CM

## 2023-01-20 DIAGNOSIS — F5101 Primary insomnia: Secondary | ICD-10-CM

## 2023-01-20 DIAGNOSIS — G8929 Other chronic pain: Secondary | ICD-10-CM

## 2023-01-20 MED ORDER — TRAZODONE HCL 50 MG PO TABS: ORAL_TABLET | ORAL | 3 refills | Status: DC

## 2023-01-20 MED ORDER — DULOXETINE HCL 20 MG PO CPEP: 20.0000 mg | ORAL_CAPSULE | Freq: Every day | ORAL | 3 refills | Status: DC

## 2023-01-20 MED ORDER — LEVOTHYROXINE SODIUM 75 MCG PO TABS: 75.0000 ug | ORAL_TABLET | Freq: Every day | ORAL | 3 refills | Status: AC

## 2023-01-20 MED ORDER — HYDROCORTISONE (PERIANAL) 2.5 % EX CREA: 1.0000 | TOPICAL_CREAM | Freq: Two times a day (BID) | CUTANEOUS | 3 refills | Status: AC

## 2023-01-20 NOTE — Assessment & Plan Note (Signed)
Still struggling some due to health challenges which are ongoing. He is taking cymbalta 20 mg daily and using trazodone for sleep with decent success. He is in counseling.

## 2023-01-20 NOTE — Progress Notes (Signed)
   Subjective:   Patient ID: Edward [name], male    DOB: [birth year], 47 y.o.   MRN: [id number]  HPI The patient is a 47 YO coming in for physical.   PMH, FMH, social history reviewed and updated  Review of Systems  Constitutional:  Positive for activity change.  HENT: Negative.    Eyes: Negative.   Respiratory:  Negative for cough, chest tightness and shortness of breath.   Cardiovascular:  Negative for chest pain, palpitations and leg swelling.  Gastrointestinal:  Negative for abdominal distention, abdominal pain, constipation, diarrhea, nausea and vomiting.       Urgency and rare incontinence with bowels  Musculoskeletal:  Positive for back pain.  Skin: Negative.   Neurological: Negative.   Psychiatric/Behavioral:  Positive for dysphoric mood.     Objective:  Physical Exam Constitutional:      Appearance: He is well-developed.  HENT:     Head: Normocephalic and atraumatic.  Cardiovascular:     Rate and Rhythm: Normal rate and regular rhythm.  Pulmonary:     Effort: Pulmonary effort is normal. No respiratory distress.     Breath sounds: Normal breath sounds. No wheezing or rales.  Abdominal:     General: Bowel sounds are normal. There is no distension.     Palpations: Abdomen is soft.     Tenderness: There is no abdominal tenderness. There is no rebound.  Musculoskeletal:        General: Tenderness present.     Cervical back: Normal range of motion.  Skin:    General: Skin is warm and dry.  Neurological:     Mental Status: He is alert and oriented to person, place, and time.     Coordination: Coordination normal.     Vitals:   01/20/23 0804  BP: 138/88  Pulse: 62  Temp: 98.3 F (36.8 C)  TempSrc: Oral  SpO2: 98%  Weight: 168 lb (76.2 kg)  Height: 5\' 9"  (1.753 m)    Assessment & Plan:

## 2023-01-20 NOTE — Assessment & Plan Note (Signed)
Flu shot up to date. Tetanus up to date. Colonoscopy up to date. Counseled about sun safety and mole surveillance. Counseled about the dangers of distracted driving. Given 10 year screening recommendations.

## 2023-01-20 NOTE — Assessment & Plan Note (Signed)
 Referral to neurosurgery for evaluation.

## 2023-01-20 NOTE — Patient Instructions (Signed)
We will get you in for the pelvic floor therapy.

## 2023-01-20 NOTE — Assessment & Plan Note (Signed)
Recent labs normal will continue levothyroxine 75 mcg daily.

## 2023-01-20 NOTE — Assessment & Plan Note (Signed)
Still in surveillance. Needs referral to pelvic floor therapy to help with rare incontinence episodes and some pain still.

## 2023-01-20 NOTE — Assessment & Plan Note (Signed)
Some snoring new in the last few years. Ordered home sleep test to assess.

## 2023-01-20 NOTE — Assessment & Plan Note (Signed)
Using trazodone and refilled.

## 2023-01-24 NOTE — Progress Notes (Unsigned)
[name] [address] [place] [address] Phone: [phone] Subjective:   [name], am serving as a scribe for Dr. Antoine Primas.  I'm seeing this patient by the request  of:  Myrlene Broker, MD  CC: back and neck pain follow up   [id number]  11/28/29024 Patient given injections bilaterally today.  Discussed which activities to do and which ones to avoid.  Increase activity slowly otherwise.  Follow-up again in 6-8 weeks      Update 01/26/2023 Edward [name] is a 47 y.o. male coming in with complaint of SI joint and C spine pain. Epidural 01/19/2023. Patient states feels some relief at this point but not a lot. Wrist pain is still the same.     Past Medical History:  Diagnosis Date   Arthritis    OA right knee   Family history of prostate cancer    Kidney stones    has a horse shoe kidney   Rectal cancer (HCC)    Thyroid disease    No past surgical history on file. Social History   Socioeconomic History   Marital status: Married    Spouse name: Not on file   Number of children: Not on file   Years of education: Not on file   Highest education level: Master's degree (e.g., MA, MS, MEng, MEd, MSW, MBA)  Occupational History   Not on file  Tobacco Use   Smoking status: Former   Smokeless tobacco: Never  Vaping Use   Vaping status: Never Used  Substance and Sexual Activity   Alcohol use: Yes    Comment: glass of wine at [facility]   Drug use: Never   Sexual activity: Not on file  Other Topics Concern   Not on file  Social History Narrative   Not on file   Social Drivers of Health   Financial Resource Strain: Low Risk  (01/16/2023)   Overall Financial Resource Strain (CARDIA)    Difficulty of Paying Living Expenses: Not hard at all  Food Insecurity: No Food Insecurity (01/16/2023)   Hunger Vital Sign    Worried About Running Out of Food in the Last Year: Never true    Ran Out of Food in the Last Year: Never true   Transportation Needs: No Transportation Needs (01/16/2023)   PRAPARE - Administrator, Civil Service (Medical): No    Lack of Transportation (Non-Medical): No  Physical Activity: Insufficiently Active (01/16/2023)   Exercise Vital Sign    Days of Exercise per Week: 2 days    Minutes of Exercise per Session: 30 min  Stress: Stress Concern Present (01/16/2023)   [employer] of Occupational Health - Occupational Stress Questionnaire    Feeling of Stress : Rather much  Social Connections: Socially Isolated (01/16/2023)   Social Connection and Isolation Panel [NHANES]    Frequency of Communication with Friends and Family: Once a week    Frequency of Social Gatherings with Friends and Family: Once a week    Attends Religious Services: Never    Database administrator or Organizations: No    Attends Engineer, structural: Not on file    Marital Status: Married   No Known Allergies Family History  Problem Relation Age of Onset   Depression Mother    Asthma Father    Cancer Father    CAD Father    Hyperlipidemia Father    Hypertension Father    Colon cancer Neg Hx  Esophageal cancer Neg Hx    Rectal cancer Neg Hx    Stomach cancer Neg Hx     Current Outpatient Medications (Endocrine & Metabolic):    levothyroxine (SYNTHROID) 75 MCG tablet, Take 1 tablet (75 mcg total) by mouth daily before breakfast.    Current Outpatient Medications (Analgesics):    meloxicam (MOBIC) 15 MG tablet, Take 1 tablet (15 mg total) by mouth daily.  Current Outpatient Medications (Hematological):    vitamin B-12 (CYANOCOBALAMIN) 1000 MCG tablet, Take 1,000 mcg by mouth daily.  Current Outpatient Medications (Other):    cholecalciferol (VITAMIN D3) 25 MCG (1000 UT) tablet, Take 2,000 Units by mouth 2 (two) times a day.   DULoxetine (CYMBALTA) 20 MG capsule, Take 1 capsule (20 mg total) by mouth daily.   hydrocortisone (ANUSOL-HC) 2.5 % rectal cream, Place 1 Application rectally  2 (two) times daily.   traZODone (DESYREL) 50 MG tablet, TAKE 1/2 TO 1 TABLET(25 TO 50 MG) BY MOUTH AT BEDTIME AS NEEDED FOR SLEEP   Reviewed prior external information including notes and imaging from  primary care provider As well as notes that were available from care everywhere and other healthcare systems.  Past medical history, social, surgical and family history all reviewed in electronic medical record.  No pertanent information unless stated regarding to the chief complaint.   Review of Systems:  No headache, visual changes, nausea, vomiting, diarrhea, constipation, dizziness, abdominal pain, skin rash, fevers, chills, night sweats, weight loss, swollen lymph nodes, body aches, joint swelling, chest pain, shortness of breath, mood changes. POSITIVE muscle aches  Objective  Blood pressure 128/80, pulse 80, height 5\' 9"  (1.753 m), weight 169 lb (76.7 kg), SpO2 96%.   General: No apparent distress alert and oriented x3 mood and affect normal, dressed appropriately.  HEENT: Pupils equal, extraocular movements intact  Respiratory: Patient's speak in full sentences and does not appear short of breath  Cardiovascular: No lower extremity edema, non tender, no erythema  Back exam does have some loss lordosis and still tenderness over the sacroiliac joint.  Seems to be bilateral. Neck exam still has some limited range of motion noted.  Does have some crepitus noted.  Tightness still noted in the shoulder blades left greater than right. Right wrist exam shows a patient does have some pain over the lunate bone.  Improvement in some range of motion.  Limited muscular skeletal ultrasound was performed and interpreted by Antoine Primas, M  Limited ultrasound of patient's scapholunate ligament appears to be intact with no significant gapping noted on dynamic but there is hypoechoic changes. Impression: No questionable subluxation of the lunate bone   Osteopathic findings C2 flexed rotated and  side bent left C4 flexed rotated and side bent left C6 flexed rotated and side bent left T3 extended rotated and side bent right inhaled third rib T9 extended rotated and side bent left L2 flexed rotated and side bent right L5 flexed rotated and side bent left Sacrum right on right     Impression and Recommendations:    Extensor carpi ulnaris tendinitis Has not responded extremely well to the extensor carpi ulnaris tendinitis.  The patient did have some some mild lunate subluxation so tried some manipulation of it.  Discussed different home exercises and continue bracing when needed.  Discussed tightness with FABER.  Ha  Chronic SI joint pain Repeating every 3 months if necessary.  Patient did respond extremely well though to osteopathic manipulation today.  Hopeful that this will continue to make some  improvement as well.  Discussed icing regimen.  Follow-up again in 6 to 8 weeks otherwise.  Degenerative disc disease, cervical Chronic with a protruding disc.  Did not respond extremely well though to the epidural.  Hold on any other repeat injections at this time.  Increase Cymbalta to 40 mg.  Will see how patient responds.  The above documentation has been reviewed and is accurate and complete Judi Saa, DO    Decision today to treat with OMT was based on Physical Exam  After verbal consent patient was treated with HVLA, ME, FPR techniques in cervical, thoracic, rib, lumbar and sacral areas, all areas are chronic   Patient tolerated the procedure well with improvement in symptoms  Patient given exercises, stretches and lifestyle modifications  See medications in patient instructions if given  Patient will follow up in 4-8 weeks  The above documentation has been reviewed and is accurate and complete Judi Saa, DO

## 2023-01-26 ENCOUNTER — Ambulatory Visit: Payer: BC Managed Care – PPO | Admitting: Family Medicine

## 2023-01-26 ENCOUNTER — Other Ambulatory Visit: Payer: Self-pay

## 2023-01-26 ENCOUNTER — Encounter: Payer: Self-pay | Admitting: Family Medicine

## 2023-01-26 VITALS — BP 128/80 | HR 80 | Ht 69.0 in | Wt 169.0 lb

## 2023-01-26 DIAGNOSIS — M9904 Segmental and somatic dysfunction of sacral region: Secondary | ICD-10-CM | POA: Diagnosis not present

## 2023-01-26 DIAGNOSIS — M533 Sacrococcygeal disorders, not elsewhere classified: Secondary | ICD-10-CM

## 2023-01-26 DIAGNOSIS — M5030 Other cervical disc degeneration, unspecified cervical region: Secondary | ICD-10-CM | POA: Diagnosis not present

## 2023-01-26 DIAGNOSIS — M9902 Segmental and somatic dysfunction of thoracic region: Secondary | ICD-10-CM | POA: Diagnosis not present

## 2023-01-26 DIAGNOSIS — M25531 Pain in right wrist: Secondary | ICD-10-CM

## 2023-01-26 DIAGNOSIS — M9901 Segmental and somatic dysfunction of cervical region: Secondary | ICD-10-CM

## 2023-01-26 DIAGNOSIS — M9903 Segmental and somatic dysfunction of lumbar region: Secondary | ICD-10-CM

## 2023-01-26 DIAGNOSIS — G8929 Other chronic pain: Secondary | ICD-10-CM

## 2023-01-26 DIAGNOSIS — M778 Other enthesopathies, not elsewhere classified: Secondary | ICD-10-CM

## 2023-01-26 DIAGNOSIS — M9908 Segmental and somatic dysfunction of rib cage: Secondary | ICD-10-CM

## 2023-01-26 NOTE — Assessment & Plan Note (Signed)
Chronic with a protruding disc.  Did not respond extremely well though to the epidural.  Hold on any other repeat injections at this time.  Increase Cymbalta to 40 mg.  Will see how patient responds.

## 2023-01-26 NOTE — Assessment & Plan Note (Signed)
Has not responded extremely well to the extensor carpi ulnaris tendinitis.  The patient did have some some mild lunate subluxation so tried some manipulation of it.  Discussed different home exercises and continue bracing when needed.  Discussed tightness with FABER.  Edward [name]

## 2023-01-26 NOTE — Patient Instructions (Signed)
Good to see you Remember tennis b all trick with wrist IBU for next 3 days Cymbalta 40mg  for one week  See me in 6 weeks

## 2023-01-26 NOTE — Assessment & Plan Note (Signed)
Repeating every 3 months if necessary.  Patient did respond extremely well though to osteopathic manipulation today.  Hopeful that this will continue to make some improvement as well.  Discussed icing regimen.  Follow-up again in 6 to 8 weeks otherwise.

## 2023-02-28 ENCOUNTER — Encounter: Payer: Self-pay | Admitting: Internal Medicine

## 2023-02-28 ENCOUNTER — Other Ambulatory Visit: Payer: Self-pay

## 2023-02-28 MED ORDER — DULOXETINE HCL 20 MG PO CPEP: 20.0000 mg | ORAL_CAPSULE | Freq: Every day | ORAL | 3 refills | Status: DC

## 2023-03-06 ENCOUNTER — Ambulatory Visit (HOSPITAL_BASED_OUTPATIENT_CLINIC_OR_DEPARTMENT_OTHER): Payer: 59 | Attending: Internal Medicine | Admitting: Internal Medicine

## 2023-03-06 DIAGNOSIS — G4733 Obstructive sleep apnea (adult) (pediatric): Secondary | ICD-10-CM | POA: Insufficient documentation

## 2023-03-06 DIAGNOSIS — R0683 Snoring: Secondary | ICD-10-CM | POA: Diagnosis present

## 2023-03-13 ENCOUNTER — Ambulatory Visit (HOSPITAL_BASED_OUTPATIENT_CLINIC_OR_DEPARTMENT_OTHER): Payer: 59 | Attending: Internal Medicine | Admitting: Internal Medicine

## 2023-03-13 VITALS — Ht 69.0 in | Wt 168.0 lb

## 2023-03-13 DIAGNOSIS — G4733 Obstructive sleep apnea (adult) (pediatric): Secondary | ICD-10-CM

## 2023-03-15 ENCOUNTER — Ambulatory Visit: Payer: 59 | Attending: Internal Medicine

## 2023-03-15 ENCOUNTER — Other Ambulatory Visit: Payer: Self-pay

## 2023-03-15 DIAGNOSIS — M62838 Other muscle spasm: Secondary | ICD-10-CM | POA: Diagnosis present

## 2023-03-15 DIAGNOSIS — M5459 Other low back pain: Secondary | ICD-10-CM | POA: Insufficient documentation

## 2023-03-15 DIAGNOSIS — C20 Malignant neoplasm of rectum: Secondary | ICD-10-CM | POA: Insufficient documentation

## 2023-03-15 DIAGNOSIS — R279 Unspecified lack of coordination: Secondary | ICD-10-CM | POA: Insufficient documentation

## 2023-03-15 DIAGNOSIS — M6281 Muscle weakness (generalized): Secondary | ICD-10-CM | POA: Insufficient documentation

## 2023-03-15 DIAGNOSIS — R293 Abnormal posture: Secondary | ICD-10-CM | POA: Diagnosis present

## 2023-03-15 NOTE — Therapy (Signed)
OUTPATIENT PHYSICAL THERAPY MALE PELVIC EVALUATION   Patient Name: Edward [name] MRN: [id number] DOB:1975/07/08, 48 y.o., male Today's Date: 03/15/2023  END OF SESSION:  PT End of Session - 03/15/23 0854     Visit Number 1    Date for PT Re-Evaluation 08/30/23    Authorization Type [employer]    PT Start Time [phone]    PT Stop Time 0928    PT Time Calculation (min) 37 min    Activity Tolerance Patient tolerated treatment well    Behavior During Therapy [facility] for tasks assessed/performed             Past Medical History:  Diagnosis Date   Arthritis    OA right knee   Family history of prostate cancer    Kidney stones    has a horse shoe kidney   Rectal cancer (HCC)    Thyroid disease    History reviewed. No pertinent surgical history. Patient Active Problem List   Diagnosis Date Noted   Snoring 01/20/2023   Degenerative disc disease, cervical 01/02/2023   Chronic SI joint pain 01/02/2023   Midline back pain 06/08/2022   Right carpal tunnel syndrome 04/25/2022   Adjustment disorder 11/04/2021   Skin mole 08/11/2021   Extensor carpi ulnaris tendinitis 06/01/2021   Routine general medical examination at a health care facility 01/05/2021   Genetic testing 12/28/2020   [facility] joint pain 11/24/2020   Rectal cancer (HCC) 09/22/2020   Insomnia 09/22/2020   Hypothyroid 09/22/2020   Nonallopathic lesion of thoracic region 11/25/2018   Nonallopathic lesion of sacral region 11/25/2018   Nonallopathic lesion of lumbosacral region 11/25/2018   Nonallopathic lesion of rib cage 11/25/2018   Nonallopathic lesion of cervical region 11/25/2018   Patellofemoral arthralgia of right knee 07/25/2018   Scapular dyskinesis 07/25/2018    PCP: Myrlene Broker, MD  REFERRING PROVIDER: Myrlene Broker, MD  REFERRING DIAG: C20 (ICD-10-CM) - Rectal cancer [facility])  THERAPY DIAG:  Muscle weakness (generalized)  Other muscle spasm  Unspecified lack of coordination  Abnormal  posture  Other low back pain  Rationale for Evaluation and Treatment: Rehabilitation  ONSET DATE: 2022  SUBJECTIVE:                                                                                                                                                                                           SUBJECTIVE STATEMENT: Pt states that he was diagnosed with rectal cancer about 2 years ago and had surgery, chemoradiation, and ileostomy. He had ileostomy for about 5 months before reversal 03/17/2022. He states that he is having difficulty with bowel control. He will  be constipated for several days if he takes ondanestron and immodium, which he does intentionally to help improve control; he has had accidents when not taking these medications. He does have urgency with bowel movements. Not exercising right now Fluid intake: states he should be drinking more water, green tea, v8 energy drinks, 3 or 4 glasses of water  PAIN:  Are you having pain? Yes NPRS scale: 4/10 Pain location:  low back  Pain type: aching and tight Pain description: constant   Aggravating factors: straining, sleeping positions, prolonged standing  Relieving factors: rest, avoiding straining  PRECAUTIONS: Other: rectal cancer 16[address] diagnosis with surgery x2 and chemo  RED FLAGS: None   WEIGHT BEARING RESTRICTIONS: No  FALLS:  Has patient fallen in last 6 months? 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, mild disc/facet degeneration in mid-lower lumbar spine  BOWEL MOVEMENT: Pain with bowel movement: No Type of bowel movement:Type (Bristol Stool Scale) 1-7, Frequency random, and Strain Yes Fully empty rectum: No Leakage: Yes: improved with immodium and  ondanestron - 3 or 4 accidents since reversal  Pads: No Fiber supplement: Yes: tried metamucil in the past  URINATION: Pain with urination: No Fully empty bladder: Yes: - Stream: Strong Urgency: No Frequency: 1-2x/night; few times a day  Leakage:  none Pads: No  INTERCOURSE: Pain with intercourse:  none Climax: WNL Ejaculation: Yes: -   OBJECTIVE:  Note: Objective measures were completed at Evaluation unless otherwise noted.  03/15/23:  COGNITION: Overall cognitive status: Within functional limits for tasks assessed     SENSATION: Light touch: Appears intact Proprioception: Appears intact   FUNCTIONAL TESTS:  Squat: WNL, some Rt knee pain Single leg stance:  Rt: Lt pelvic drop  Lt:Rt pelvic drop Curl-up test: 3 finger width diastasis with distortion inferior/superior to umbilicus Sit-up test: 2/3  GAIT: Comments: WNL  POSTURE: rounded shoulders, forward head, decreased lumbar lordosis, decreased thoracic kyphosis, and posterior pelvic tilt  PELVIC ALIGNMENT: Rt posterior rotation  LUMBARAROM/PROM:  A/PROM A/PROM  Eval (% available)  Flexion 75  Extension 50  Right lateral flexion 50  Left lateral flexion 50  Right rotation 50  Left rotation 75   (Blank rows = not tested)   PALPATION: GENERAL significant scar tissue restriction in abdomen, Rt>Lt; tenderness in lower abdomen; rib cage restriction, lumbar paraspinal tone, Rt>Lt; tenderness at midline throughout lumbar and lower thoracic spine; decreased lumbar spine mobility with PA mobilizations               External Perineal Exam deferred to next session              Internal Pelvic Floor deferred to next session Patient confirms identification and approves PT to assess internal pelvic floor and treatment Yes deferred to next session  PELVIC MMT: deferred to next session   MMT eval  Internal Anal Sphincter   External Anal Sphincter   Puborectalis   Diastasis Recti 3 finger width diastasis with  distortion  (Blank rows = not tested)  TONE: deferred to next session  TODAY'S TREATMENT:  DATE:  03/15/23  EVAL  Exercises: Cat cow 10x Open books 10x Supine piriformis stretch 60 sec  Lower trunk rotation 10x Modified thomas stretch 60 sec bil Therapeutic activities: Self-bowel massage    PATIENT EDUCATION:  Education details: See above Person educated: Patient Education method: Explanation, Demonstration, Tactile cues, Verbal cues, and Handouts Education comprehension: verbalized understanding  HOME EXERCISE PROGRAM: [id number]  ASSESSMENT:  CLINICAL IMPRESSION: Patient is a 48 y.o. male who was seen today for physical therapy evaluation and treatment for bowel dysfunction since rectal cancer treatment. Exam findings notable for decreased and very stiff lumbar A/ROM, abnormal posture, functional weakness demonstrated through pelvic drop in single leg stance and sit-up test, diastasis recti abdominus with distortion in curl-up test, abdominal scar tissue restriction, lower abdominal tenderness, rib cage restriction, and high tone in lumbar paraspinals (Rt>Lt); internal pelvic floor exam deferred until next session due to time constraints. Signs and symptoms are most consistent with pelvic floor muscle weakness and decreased coordination, abdominal scar tissue, and overall decreased activity level. Initial treatment consisted of mobility exercises and self bowel mobilization. He will continue to benefit from skilled PT intervention in order to decrease bowel dysfunction, improve abdominal pressure management, and begin/progress functional strengthening program.   OBJECTIVE IMPAIRMENTS: decreased activity tolerance, decreased coordination, decreased endurance, decreased mobility, decreased strength, increased fascial restrictions, increased muscle spasms,  impaired tone, postural dysfunction, and pain.   ACTIVITY LIMITATIONS: continence  PARTICIPATION LIMITATIONS: community activity, occupation, and exercise  PERSONAL FACTORS: 1 comorbidity: medical history  are also affecting patient's functional outcome.   REHAB POTENTIAL: Good  CLINICAL DECISION MAKING: Stable/uncomplicated  EVALUATION COMPLEXITY: Low   GOALS: Goals reviewed with patient? Yes  SHORT TERM GOALS: Target date: 04/12/2023    Pt will be independent with HEP.   Baseline: Goal status: INITIAL  2.  Pt will be independent with use of squatty potty, relaxed toileting mechanics, and improved bowel movement techniques in order to increase ease of bowel movements and complete evacuation.   Baseline:  Goal status: INITIAL  3.  Pt will perform daily self bowel mobilization in order to improve frequency and complete evacuation.  Baseline:  Goal status: INITIAL    LONG TERM GOALS: Target date: 08/30/23  Pt will be independent with advanced HEP.   Baseline:  Goal status: INITIAL  2.  Pt will demonstrate improved mobility in abdominal scar tissue. Baseline:  Goal status: INITIAL  3.  Pt will be able to perform curl-up test in supine without abdominal distortion. Baseline:  Goal status: INITIAL  4.  Pt will be able to perform single leg stance bil without any pelvic drop in order to demonstrate improved functional core strength.  Baseline:  Goal status: INITIAL  5.  Pt will report 75% improvement in fecal urgency. Baseline:  Goal status: INITIAL  6.  Pt will report no episodes of fecal incontinence.  Baseline:  Goal status: INITIAL   PLAN:  PT FREQUENCY: 1-2x/week  PT DURATION: 6 months  PLANNED INTERVENTIONS: 97110-Therapeutic exercises, 97530- Therapeutic activity, 97112- Neuromuscular re-education, 97535- Self Care, 41324- Manual therapy, Dry Needling, and Biofeedback  PLAN FOR NEXT SESSION: Discuss increasing water intake and decreasing  caffeine; bowel/scar tissue mobilization; internal rectal pelvic floor muscle exam.    Julio Alm, PT, DPT01/29/252:50 PM

## 2023-03-18 DIAGNOSIS — R0683 Snoring: Secondary | ICD-10-CM

## 2023-03-18 NOTE — Procedures (Signed)
    Patient Name: Edward [name], Edward [name] Date: 03/13/2023 Gender: Male D.O.B: [birth year] Age (years): 47 Referring Provider: Hillard Danker Height (inches): 69 Interpreting Physician: Jetty Duhamel MD, ABSM Weight (lbs): 168 RPSGT: Elaina Pattee BMI: 25 MRN: [id number] Neck Size: <br>  CLINICAL INFORMATION Sleep Study Type: HST Indication for sleep study: OSA Epworth Sleepiness Score: 4  SLEEP STUDY TECHNIQUE A multi-channel overnight portable sleep study was performed. The channels recorded were: nasal airflow, thoracic respiratory movement, and oxygen saturation with a pulse oximetry. Snoring was also monitored.  MEDICATIONS Patient self administered medications include: none reported.  SLEEP ARCHITECTURE Patient was studied for 364.5 minutes. The sleep efficiency was 100.0 % and the patient was supine for 0%. The arousal index was 0.0 per hour.  RESPIRATORY PARAMETERS The overall AHI was 7.2 per hour, with a central apnea index of 0 per hour. The oxygen nadir was 91% during sleep.  CARDIAC DATA Mean heart rate during sleep was 62.0 bpm.  IMPRESSIONS - Mild obstructive sleep apnea occurred during this study (AHI = 7.2/h). - The patient had minimal or no oxygen desaturation during the study (Min O2 = 91%) - Patient snored. \ DIAGNOSIS - Obstructive Sleep Apnea (G47.33)  RECOMMENDATIONS - Treatment options for mild OSA are guided by symptoms and co-morbidity. Conservative measures may include observation, weight loss and sleep position off back. Other options, including CPAP, a fitted oral appliance or ENT evaluation, would be based on clinical judgment. - Avoid alcohol, sedatives and other CNS depressants that may worsen sleep apnea and disrupt normal sleep architecture. - Sleep hygiene should be reviewed to assess factors that may improve sleep quality. - Weight management and regular exercise should be initiated or continued.  [Electronically signed]  03/18/2023 12:16 PM  Jetty Duhamel MD, ABSM Diplomate, American Board of Sleep Medicine NPI: [id number]                         Jetty Duhamel Diplomate, American Board of Sleep Medicine  ELECTRONICALLY SIGNED ON:  03/18/2023, 12:12 PM [facility] PH: [phone]   FX: [phone] ACCREDITED BY THE AMERICAN ACADEMY OF SLEEP MEDICINE

## 2023-03-22 ENCOUNTER — Ambulatory Visit: Payer: 59 | Attending: Internal Medicine

## 2023-03-22 DIAGNOSIS — M6281 Muscle weakness (generalized): Secondary | ICD-10-CM | POA: Diagnosis present

## 2023-03-22 DIAGNOSIS — M5459 Other low back pain: Secondary | ICD-10-CM | POA: Diagnosis present

## 2023-03-22 DIAGNOSIS — M62838 Other muscle spasm: Secondary | ICD-10-CM | POA: Insufficient documentation

## 2023-03-22 DIAGNOSIS — R293 Abnormal posture: Secondary | ICD-10-CM | POA: Diagnosis present

## 2023-03-22 DIAGNOSIS — R279 Unspecified lack of coordination: Secondary | ICD-10-CM | POA: Diagnosis present

## 2023-03-22 NOTE — Therapy (Signed)
 OUTPATIENT PHYSICAL THERAPY MALE PELVIC TREATMENT   Patient Name: Edward [name] MRN: [id number] DOB:02/23/1975, 48 y.o., male Today's Date: 03/22/2023  END OF SESSION:  PT End of Session - 03/22/23 0943     Visit Number 2    Date for PT Re-Evaluation 08/30/23    Authorization Type [employer]    PT Start Time 0845    PT Stop Time 0938    PT Time Calculation (min) 53 min    Activity Tolerance Patient tolerated treatment well    Behavior During Therapy WFL for tasks assessed/performed              Past Medical History:  Diagnosis Date   Arthritis    OA right knee   Family history of prostate cancer    Kidney stones    has a horse shoe kidney   Rectal cancer (HCC)    Thyroid  disease    History reviewed. No pertinent surgical history. Patient Active Problem List   Diagnosis Date Noted   Snoring 01/20/2023   Degenerative disc disease, cervical 01/02/2023   Chronic SI joint pain 01/02/2023   Midline back pain 06/08/2022   Right carpal tunnel syndrome 04/25/2022   Adjustment disorder 11/04/2021   Skin mole 08/11/2021   Extensor carpi ulnaris tendinitis 06/01/2021   Routine general medical examination at a health care facility 01/05/2021   Genetic testing 12/28/2020   [facility] joint pain 11/24/2020   Rectal cancer (HCC) 09/22/2020   Insomnia 09/22/2020   Hypothyroid 09/22/2020   Nonallopathic lesion of thoracic region 11/25/2018   Nonallopathic lesion of sacral region 11/25/2018   Nonallopathic lesion of lumbosacral region 11/25/2018   Nonallopathic lesion of rib cage 11/25/2018   Nonallopathic lesion of cervical region 11/25/2018   Patellofemoral arthralgia of right knee 07/25/2018   Scapular dyskinesis 07/25/2018    PCP: Rollene Almarie LABOR, MD  REFERRING PROVIDER: Rollene Almarie LABOR, MD  REFERRING DIAG: C20 (ICD-10-CM) - Rectal cancer [facility])  THERAPY DIAG:  Muscle weakness (generalized)  Other muscle spasm  Unspecified lack of coordination  Abnormal  posture  Other low back pain  Rationale for Evaluation and Treatment: Rehabilitation  ONSET DATE: 2022  SUBJECTIVE:                                                                                                                                                                                           SUBJECTIVE STATEMENT: Pt states that he had a difficult day Monday with many trips to the bathroom.   PAIN: 03/22/23 Are you having pain? Yes NPRS scale: 4/10 Pain location:  low back  Pain type: aching and tight Pain description:  constant   Aggravating factors: straining, sleeping positions, prolonged standing  Relieving factors: rest, avoiding straining  PRECAUTIONS: Other: rectal cancer [id number] diagnosis with surgery x2 and chemo  RED FLAGS: None   WEIGHT BEARING RESTRICTIONS: No  FALLS:  Has patient fallen in last 6 months? 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, mild disc/facet degeneration in mid-lower lumbar spine  BOWEL MOVEMENT: Pain with bowel movement: No Type of bowel movement:Type (Bristol Stool Scale) 1-7, Frequency random, and Strain Yes Fully empty rectum: No Leakage: Yes: improved with immodium and ondanestron - 3 or 4 accidents since reversal  Pads: No Fiber supplement: Yes: tried metamucil in the past  URINATION: Pain with urination: No Fully empty bladder: Yes: - Stream: Strong Urgency: No Frequency: 1-2x/night; few times a day  Leakage:  none Pads: No  INTERCOURSE: Pain with intercourse:  none Climax: WNL Ejaculation: Yes: -   OBJECTIVE:  Note: Objective measures were completed at Evaluation unless otherwise noted. 03/22/23:             External Perineal Exam deferred to next  session              Internal Pelvic Floor Significant restriction and scar tissue with tenderness  Patient confirms identification and approves PT to assess internal pelvic floor and treatment Yes   PELVIC MMT:    MMT eval  Internal Anal Sphincter 1/5  External Anal Sphincter 2/5 with multimodal cues to help isolate contraction  Puborectalis 1/5  Diastasis Recti 3 finger width diastasis with distortion  (Blank rows = not tested)  TONE: deferred to next session 03/15/23:  COGNITION: Overall cognitive status: Within functional limits for tasks assessed     SENSATION: Light touch: Appears intact Proprioception: Appears intact   FUNCTIONAL TESTS:  Squat: WNL, some Rt knee pain Single leg stance:  Rt: Lt pelvic drop  Lt:Rt pelvic drop Curl-up test: 3 finger width diastasis with distortion inferior/superior to umbilicus Sit-up test: 2/3  GAIT: Comments: WNL  POSTURE: rounded shoulders, forward head, decreased lumbar lordosis, decreased thoracic kyphosis, and posterior pelvic tilt  PELVIC ALIGNMENT: Rt posterior rotation  LUMBARAROM/PROM:  A/PROM A/PROM  Eval (% available)  Flexion 75  Extension 50  Right lateral flexion 50  Left lateral flexion 50  Right rotation 50  Left rotation 75   (Blank rows = not tested)   PALPATION: GENERAL significant scar tissue restriction in abdomen, Rt>Lt; tenderness in lower abdomen; rib cage restriction, lumbar paraspinal tone, Rt>Lt; tenderness at midline throughout lumbar and lower thoracic spine; decreased lumbar spine mobility with PA mobilizations     TODAY'S TREATMENT:  DATE:  03/22/23 Manual: Pt provides verbal consent for internal vaginal/rectal pelvic floor exam. Internal rectal pelvic floor muscle exam in side lying Rectal scar tissue mobilization in side lying Deep pelvic floor muscle release  in side lying rectally Abdominal scar tissue mobilization/bowel mobilization Neuromuscular re-education: Diaphragmatic breathing for improved abdominal and pelvic floor muscle relaxation/lengthening Pelvic floor muscle A/ROM training for improved scar tissue mobility and circulation Therapeutic activities: Pelvic floor muscle wand education   03/15/23  EVAL  Exercises: Cat cow 10x Open books 10x Supine piriformis stretch 60 sec  Lower trunk rotation 10x Modified thomas stretch 60 sec bil Therapeutic activities: Self-bowel massage    PATIENT EDUCATION:  Education details: See above Person educated: Patient Education method: Programmer, Multimedia, Demonstration, Tactile cues, Verbal cues, and Handouts Education comprehension: verbalized understanding  HOME EXERCISE PROGRAM: [id number]  ASSESSMENT:  CLINICAL IMPRESSION: Pt had difficulty with performing modified thomas stretch at home due to not having a surface high enough. We modified to do kneeling hip flexor stretch. We performed internal rectal exma and noted significant scar tissue restriction and muscle tightness, Lt>Rt. He tolerated release techniques moderately well in side lying. Diaphragmatic breathing breathing and pelvic floor muscle training performed to help improve mobility and lengthening of the pelvic floor and scar tissue further. He did have difficulty isolating pelvic floor muscle contraction to external anal sphincter and pelvic floor muscles, contracting abdominal muscles and glutes; he was able to correct with good proprioception. We also addressed abdominal scare tissue restriction with manual techniques which were tolerated very well. He will continue to benefit from skilled PT intervention in order to decrease bowel dysfunction, improve abdominal pressure management, and begin/progress functional strengthening program.   OBJECTIVE IMPAIRMENTS: decreased activity tolerance, decreased coordination, decreased endurance,  decreased mobility, decreased strength, increased fascial restrictions, increased muscle spasms, impaired tone, postural dysfunction, and pain.   ACTIVITY LIMITATIONS: continence  PARTICIPATION LIMITATIONS: community activity, occupation, and exercise  PERSONAL FACTORS: 1 comorbidity: medical history  are also affecting patient's functional outcome.   REHAB POTENTIAL: Good  CLINICAL DECISION MAKING: Stable/uncomplicated  EVALUATION COMPLEXITY: Low   GOALS: Goals reviewed with patient? Yes  SHORT TERM GOALS: Target date: 04/12/2023    Pt will be independent with HEP.   Baseline: Goal status: INITIAL  2.  Pt will be independent with use of squatty potty, relaxed toileting mechanics, and improved bowel movement techniques in order to increase ease of bowel movements and complete evacuation.   Baseline:  Goal status: INITIAL  3.  Pt will perform daily self bowel mobilization in order to improve frequency and complete evacuation.  Baseline:  Goal status: INITIAL    LONG TERM GOALS: Target date: 08/30/23  Pt will be independent with advanced HEP.   Baseline:  Goal status: INITIAL  2.  Pt will demonstrate improved mobility in abdominal scar tissue. Baseline:  Goal status: INITIAL  3.  Pt will be able to perform curl-up test in supine without abdominal distortion. Baseline:  Goal status: INITIAL  4.  Pt will be able to perform single leg stance bil without any pelvic drop in order to demonstrate improved functional core strength.  Baseline:  Goal status: INITIAL  5.  Pt will report 75% improvement in fecal urgency. Baseline:  Goal status: INITIAL  6.  Pt will report no episodes of fecal incontinence.  Baseline:  Goal status: INITIAL   PLAN:  PT FREQUENCY: 1-2x/week  PT DURATION: 6 months  PLANNED INTERVENTIONS: 97110-Therapeutic exercises, 97530- Therapeutic activity, [id number]- Neuromuscular re-education, 97535- Self  Care, [id number]- Manual therapy, Dry Needling,  and Biofeedback  PLAN FOR NEXT SESSION: Discuss increasing water intake and decreasing caffeine; bowel/scar tissue mobilization; internal rectal pelvic floor muscle exam.    Josette Mares, PT, DPT02/05/259:47 AM

## 2023-03-28 ENCOUNTER — Encounter: Payer: Self-pay | Admitting: Internal Medicine

## 2023-04-04 ENCOUNTER — Ambulatory Visit: Payer: 59

## 2023-04-04 DIAGNOSIS — M5459 Other low back pain: Secondary | ICD-10-CM

## 2023-04-04 DIAGNOSIS — M6281 Muscle weakness (generalized): Secondary | ICD-10-CM | POA: Diagnosis not present

## 2023-04-04 DIAGNOSIS — M62838 Other muscle spasm: Secondary | ICD-10-CM

## 2023-04-04 DIAGNOSIS — R293 Abnormal posture: Secondary | ICD-10-CM

## 2023-04-04 DIAGNOSIS — R279 Unspecified lack of coordination: Secondary | ICD-10-CM

## 2023-04-04 NOTE — Patient Instructions (Addendum)
 Urge Incontinence  Ideal urination frequency is every 2-4 wakeful hours, which equates to 5-8 times within a 24-hour period.   Urge incontinence is leakage that occurs when the bladder muscle contracts, creating a sudden need to go before getting to the bathroom.   Going too often when your bladder isn't actually full can disrupt the body's automatic signals to store and hold urine longer, which will increase urgency/frequency.  In this case, the bladder "is running the show" and strategies can be learned to retrain this pattern.   One should be able to control the first urge to urinate, at around .  The bladder can hold up to a "grande latte," or . To help you gain control, practice the Urge Drill below when urgency strikes.  This drill will help retrain your bladder signals and allow you to store and hold urine longer.  The overall goal is to stretch out your time between voids to reach a more manageable voiding schedule.    Practice your "quick flicks" often throughout the day (each waking hour) even when you don't need feel the urge to go.  This will help strengthen your pelvic floor muscles, making them more effective in controlling leakage.  Urge Drill  When you feel an urge to go, follow these steps to regain control: Stop what you are doing and be still Take one deep breath, directing your air into your abdomen Think an affirming thought, such as "I've got this." Do 5 quick flicks of your pelvic floor Walk with control to the bathroom to void, or delay voiding   [facility] [address] [place], [place] [address] Phone # [phone] Fax [phone]  Trigger Point Dry Needling  What is Trigger Point Dry Needling (DN)? DN is a physical therapy technique used to treat muscle pain and dysfunction. Specifically, DN helps deactivate muscle trigger points (muscle knots).  A thin filiform needle is used to penetrate the skin and stimulate  the underlying trigger point. The goal is for a local twitch response (LTR) to occur and for the trigger point to relax. No medication of any kind is injected during the procedure.   What Does Trigger Point Dry Needling Feel Like?  The procedure feels different for each individual patient. Some patients report that they do not actually feel the needle enter the skin and overall the process is not painful. Very mild bleeding may occur. However, many patients feel a deep cramping in the muscle in which the needle was inserted. This is the local twitch response.   How Will I feel after the treatment? Soreness is normal, and the onset of soreness may not occur for a few hours. Typically this soreness does not last longer than two days.  Bruising is uncommon, however; ice can be used to decrease any possible bruising.  In rare cases feeling tired or nauseous after the treatment is normal. In addition, your symptoms may get worse before they get better, this period will typically not last longer than 24 hours.   What Can I do After My Treatment? Increase your hydration by drinking more water for the next 24 hours.  You may place ice or heat on the areas treated that have become sore, however, do not use heat on inflamed or bruised areas. Heat often brings more relief post needling. You can continue your regular activities, but vigorous activity is not recommended initially after the treatment for 24 hours. DN is best combined with other physical therapy  such as strengthening, stretching, and other therapies.   What are the complications? While your therapist has had extensive training in minimizing the risks of trigger point dry needling, it is important to understand the risks of any procedure.  Risks include bleeding, pain, fatigue, hematoma, infection, vertigo, nausea or nerve involvement. Monitor for any changes to your skin or sensation. Contact your therapist or MD with concerns.  A rare but  serious complication is a pneumothorax over or near your middle and upper chest and back If you have dry needling in this area, monitor for the following symptoms: Shortness of breath on exertion and/or Difficulty taking a deep breath and/or Chest Pain and/or A dry cough If any of the above symptoms develop, please go to the nearest emergency room or call 911. Tell them you had dry needling over your thorax and report any symptoms you are having. Please follow-up with your treating therapist after you complete the medical evaluation.   [facility]  [address] [place] [address].  [phone]

## 2023-04-04 NOTE — Therapy (Signed)
 OUTPATIENT PHYSICAL THERAPY MALE PELVIC TREATMENT   Patient Name: Rochester Serpe MRN: [id number] DOB:[birth year], 48 y.o., male Today's Date: 04/04/2023  END OF SESSION:  PT End of Session - 04/04/23 0938     Visit Number 3    Date for PT Re-Evaluation 08/30/23    Authorization Type [employer]    PT Start Time 0935    PT Stop Time 1013    PT Time Calculation (min) 38 min    Activity Tolerance Patient tolerated treatment well    Behavior During Therapy WFL for tasks assessed/performed              Past Medical History:  Diagnosis Date   Arthritis    OA right knee   Family history of prostate cancer    Kidney stones    has a horse shoe kidney   Rectal cancer (HCC)    Thyroid disease    History reviewed. No pertinent surgical history. Patient Active Problem List   Diagnosis Date Noted   Snoring 01/20/2023   Degenerative disc disease, cervical 01/02/2023   Chronic SI joint pain 01/02/2023   Midline back pain 06/08/2022   Right carpal tunnel syndrome 04/25/2022   Adjustment disorder 11/04/2021   Skin mole 08/11/2021   Extensor carpi ulnaris tendinitis 06/01/2021   Routine general medical examination at a health care facility 01/05/2021   Genetic testing 12/28/2020   [facility] joint pain 11/24/2020   Rectal cancer (HCC) 09/22/2020   Insomnia 09/22/2020   Hypothyroid 09/22/2020   Nonallopathic lesion of thoracic region 11/25/2018   Nonallopathic lesion of sacral region 11/25/2018   Nonallopathic lesion of lumbosacral region 11/25/2018   Nonallopathic lesion of rib cage 11/25/2018   Nonallopathic lesion of cervical region 11/25/2018   Patellofemoral arthralgia of right knee 07/25/2018   Scapular dyskinesis 07/25/2018    PCP: Myrlene Broker, MD  REFERRING PROVIDER: Myrlene Broker, MD  REFERRING DIAG: C20 (ICD-10-CM) - Rectal cancer [facility])  THERAPY DIAG:  Muscle weakness (generalized)  Other muscle spasm  Unspecified lack of coordination  Abnormal  posture  Other low back pain  Rationale for Evaluation and Treatment: Rehabilitation  ONSET DATE: 2022  SUBJECTIVE:                                                                                                                                                                                           SUBJECTIVE STATEMENT: Pt states that he is very tired. He has not been sleeping well. Pt states that he was very sore after last session that lasted for about a day. He has a little bright red blood in stool last week.  He feels like urgency may have improved slightly.   PAIN: 04/04/23 Are you having pain? Yes NPRS scale: 5/10 Pain location:  low back  Pain type: aching and tight Pain description: constant   Aggravating factors: straining, sleeping positions, prolonged standing  Relieving factors: rest, avoiding straining  PRECAUTIONS: Other: rectal cancer 16109 diagnosis with surgery x2 and chemo  RED FLAGS: None   WEIGHT BEARING RESTRICTIONS: No  FALLS:  Has patient fallen in last 6 months? 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, mild disc/facet degeneration in mid-lower lumbar spine  BOWEL MOVEMENT: Pain with bowel movement: No Type of bowel movement:Type (Bristol Stool Scale) 1-7, Frequency random, and Strain Yes Fully empty rectum: No Leakage: Yes: improved with immodium and ondanestron - 3 or 4 accidents since reversal  Pads: No Fiber supplement: Yes: tried metamucil in the past  URINATION: Pain with urination: No Fully empty bladder: Yes: - Stream: Strong Urgency: No Frequency: 1-2x/night; few times a day  Leakage:  none Pads: No  INTERCOURSE: Pain with intercourse:  none Climax: WNL Ejaculation: Yes:  -   OBJECTIVE:  Note: Objective measures were completed at Evaluation unless otherwise noted. 03/22/23:             External Perineal Exam deferred to next session              Internal Pelvic Floor Significant restriction and scar tissue with tenderness  Patient confirms identification and approves PT to assess internal pelvic floor and treatment Yes   PELVIC MMT:    MMT eval  Internal Anal Sphincter 1/5  External Anal Sphincter 2/5 with multimodal cues to help isolate contraction  Puborectalis 1/5  Diastasis Recti 3 finger width diastasis with distortion  (Blank rows = not tested)  TONE: deferred to next session 03/15/23:  COGNITION: Overall cognitive status: Within functional limits for tasks assessed     SENSATION: Light touch: Appears intact Proprioception: Appears intact   FUNCTIONAL TESTS:  Squat: WNL, some Rt knee pain Single leg stance:  Rt: Lt pelvic drop  Lt:Rt pelvic drop Curl-up test: 3 finger width diastasis with distortion inferior/superior to umbilicus Sit-up test: 2/3  GAIT: Comments: WNL  POSTURE: rounded shoulders, forward head, decreased lumbar lordosis, decreased thoracic kyphosis, and posterior pelvic tilt  PELVIC ALIGNMENT: Rt posterior rotation  LUMBARAROM/PROM:  A/PROM A/PROM  Eval (% available)  Flexion 75  Extension 50  Right lateral flexion 50  Left lateral flexion 50  Right rotation 50  Left rotation 75   (Blank rows = not tested)   PALPATION: GENERAL significant scar tissue restriction in abdomen, Rt>Lt; tenderness in lower abdomen; rib cage restriction, lumbar paraspinal tone, Rt>Lt; tenderness at midline throughout lumbar and lower thoracic spine; decreased lumbar spine mobility with PA mobilizations     TODAY'S TREATMENT:  DATE:  04/04/23 Manual: Trigger Point Dry Needling  Initial Treatment: Pt  instructed on Dry Needling rational, procedures, and possible side effects. Pt instructed to expect mild to moderate muscle soreness later in the day and/or into the next day.  Pt instructed in methods to reduce muscle soreness. Pt instructed to continue prescribed HEP. Patient was educated on signs and symptoms of infection and other risk factors and advised to seek medical attention should they occur.  Patient verbalized understanding of these instructions and education.   Patient Verbal Consent Given: Yes Education Handout Provided: Yes Muscles Treated: Bil L3-S2 multifidi and bil glutes proximal to SIJs Electrical Stimulation Performed: No Treatment Response/Outcome: twitch response/release  Soft tissue mobilization to lumbar paraspinals Neuromuscular re-education: Transversus abdominus training with multimodal cues for improved motor control and breath coordination Supine posterior pelvic tilts with dynadisc 2 x 10 Exercises: Cat cow 10x Tail wags 10x Therapeutic activities: Urge drill for bowel urgency   03/22/23 Manual: Pt provides verbal consent for internal vaginal/rectal pelvic floor exam. Internal rectal pelvic floor muscle exam in side lying Rectal scar tissue mobilization in side lying Deep pelvic floor muscle release in side lying rectally Abdominal scar tissue mobilization/bowel mobilization Neuromuscular re-education: Diaphragmatic breathing for improved abdominal and pelvic floor muscle relaxation/lengthening Pelvic floor muscle A/ROM training for improved scar tissue mobility and circulation Therapeutic activities: Pelvic floor muscle wand education   03/15/23  EVAL  Exercises: Cat cow 10x Open books 10x Supine piriformis stretch 60 sec  Lower trunk rotation 10x Modified thomas stretch 60 sec bil Therapeutic activities: Self-bowel massage    PATIENT EDUCATION:  Education details: See above Person educated: Patient Education method: Programmer, multimedia,  Demonstration, Tactile cues, Verbal cues, and Handouts Education comprehension: verbalized understanding  HOME EXERCISE PROGRAM: [id number]  ASSESSMENT:  CLINICAL IMPRESSION: Pt has been working on pelvic floor contractions with breathing and feels like there is improvement in urgency. Due to this, we discussed urge drill and how to use with fecal urgency; he is agreeable and demonstrates understanding. Believe lack of lumbar mobility is impacting pain and pelvic floor muscle function, therefore we performed dry needling and soft tissue mobilization to help improve. He did well with initial core training and pelvic tilts to help improve this mobility and offer improved lumbopelvic stabilization. He will continue to benefit from skilled PT intervention in order to decrease bowel dysfunction, improve abdominal pressure management, and begin/progress functional strengthening program.   OBJECTIVE IMPAIRMENTS: decreased activity tolerance, decreased coordination, decreased endurance, decreased mobility, decreased strength, increased fascial restrictions, increased muscle spasms, impaired tone, postural dysfunction, and pain.   ACTIVITY LIMITATIONS: continence  PARTICIPATION LIMITATIONS: community activity, occupation, and exercise  PERSONAL FACTORS: 1 comorbidity: medical history  are also affecting patient's functional outcome.   REHAB POTENTIAL: Good  CLINICAL DECISION MAKING: Stable/uncomplicated  EVALUATION COMPLEXITY: Low   GOALS: Goals reviewed with patient? Yes  SHORT TERM GOALS: Target date: 04/12/2023    Pt will be independent with HEP.   Baseline: Goal status: INITIAL  2.  Pt will be independent with use of squatty potty, relaxed toileting mechanics, and improved bowel movement techniques in order to increase ease of bowel movements and complete evacuation.   Baseline:  Goal status: INITIAL  3.  Pt will perform daily self bowel mobilization in order to improve frequency  and complete evacuation.  Baseline:  Goal status: INITIAL    LONG TERM GOALS: Target date: 08/30/23  Pt will be independent with advanced HEP.   Baseline:  Goal status: INITIAL  2.  Pt will demonstrate improved mobility in abdominal scar tissue. Baseline:  Goal status: INITIAL  3.  Pt will be able to perform curl-up test in supine without abdominal distortion. Baseline:  Goal status: INITIAL  4.  Pt will be able to perform single leg stance bil without any pelvic drop in order to demonstrate improved functional core strength.  Baseline:  Goal status: INITIAL  5.  Pt will report 75% improvement in fecal urgency. Baseline:  Goal status: INITIAL  6.  Pt will report no episodes of fecal incontinence.  Baseline:  Goal status: INITIAL   PLAN:  PT FREQUENCY: 1-2x/week  PT DURATION: 6 months  PLANNED INTERVENTIONS: 97110-Therapeutic exercises, 97530- Therapeutic activity, 97112- Neuromuscular re-education, 97535- Self Care, 16109- Manual therapy, Dry Needling, and Biofeedback  PLAN FOR NEXT SESSION: Continue manual techniques to pelvic scar tissue and low back; progress core training; progress mobility activities.    Julio Alm, PT, DPT02/18/2510:15 AM

## 2023-04-12 ENCOUNTER — Ambulatory Visit: Payer: 59

## 2023-04-12 DIAGNOSIS — R279 Unspecified lack of coordination: Secondary | ICD-10-CM

## 2023-04-12 DIAGNOSIS — M5459 Other low back pain: Secondary | ICD-10-CM

## 2023-04-12 DIAGNOSIS — M62838 Other muscle spasm: Secondary | ICD-10-CM

## 2023-04-12 DIAGNOSIS — M6281 Muscle weakness (generalized): Secondary | ICD-10-CM | POA: Diagnosis not present

## 2023-04-12 DIAGNOSIS — R293 Abnormal posture: Secondary | ICD-10-CM

## 2023-04-12 NOTE — Therapy (Signed)
 OUTPATIENT PHYSICAL THERAPY MALE PELVIC TREATMENT   Patient Name: Edward [name] MRN: [id number] DOB:17-Jun-1975, 48 y.o., male Today's Date: 04/12/2023  END OF SESSION:  PT End of Session - 04/12/23 0802     Visit Number 4    Date for PT Re-Evaluation 08/30/23    Authorization Type [employer]    PT Start Time 0802    PT Stop Time 0842    PT Time Calculation (min) 40 min    Activity Tolerance Patient tolerated treatment well    Behavior During Therapy WFL for tasks assessed/performed              Past Medical History:  Diagnosis Date   Arthritis    OA right knee   Family history of prostate cancer    Kidney stones    has a horse shoe kidney   Rectal cancer (HCC)    Thyroid disease    History reviewed. No pertinent surgical history. Patient Active Problem List   Diagnosis Date Noted   Snoring 01/20/2023   Degenerative disc disease, cervical 01/02/2023   Chronic SI joint pain 01/02/2023   Midline back pain 06/08/2022   Right carpal tunnel syndrome 04/25/2022   Adjustment disorder 11/04/2021   Skin mole 08/11/2021   Extensor carpi ulnaris tendinitis 06/01/2021   Routine general medical examination at a health care facility 01/05/2021   Genetic testing 12/28/2020   [facility] joint pain 11/24/2020   Rectal cancer (HCC) 09/22/2020   Insomnia 09/22/2020   Hypothyroid 09/22/2020   Nonallopathic lesion of thoracic region 11/25/2018   Nonallopathic lesion of sacral region 11/25/2018   Nonallopathic lesion of lumbosacral region 11/25/2018   Nonallopathic lesion of rib cage 11/25/2018   Nonallopathic lesion of cervical region 11/25/2018   Patellofemoral arthralgia of right knee 07/25/2018   Scapular dyskinesis 07/25/2018    PCP: Myrlene Broker, MD  REFERRING PROVIDER: Myrlene Broker, MD  REFERRING DIAG: C20 (ICD-10-CM) - Rectal cancer [facility])  THERAPY DIAG:  Muscle weakness (generalized)  Other muscle spasm  Unspecified lack of coordination  Abnormal  posture  Other low back pain  Rationale for Evaluation and Treatment: Rehabilitation  ONSET DATE: 2022  SUBJECTIVE:                                                                                                                                                                                           SUBJECTIVE STATEMENT: Pt states that he feels like he is doing better. He is not going as frequently and is not having as much urgency. He does feel like he is on the toilet for a long time, up to 45 minutes.  PAIN: 04/04/23 Are you having pain? Yes NPRS scale: 5/10 Pain location:  low back  Pain type: aching and tight Pain description: constant   Aggravating factors: straining, sleeping positions, prolonged standing  Relieving factors: rest, avoiding straining  PRECAUTIONS: Other: rectal cancer 16109 diagnosis with surgery x2 and chemo  RED FLAGS: None   WEIGHT BEARING RESTRICTIONS: No  FALLS:  Has patient fallen in last 6 months? 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, mild disc/facet degeneration in mid-lower lumbar spine  BOWEL MOVEMENT: Pain with bowel movement: No Type of bowel movement:Type (Bristol Stool Scale) 1-7, Frequency random, and Strain Yes Fully empty rectum: No Leakage: Yes: improved with immodium and ondanestron - 3 or 4 accidents since reversal  Pads: No Fiber supplement: Yes: tried metamucil in the past  URINATION: Pain with urination: No Fully empty bladder: Yes: - Stream: Strong Urgency: No Frequency: 1-2x/night; few times a day  Leakage:  none Pads: No  INTERCOURSE: Pain with intercourse:  none Climax: WNL Ejaculation: Yes: -   OBJECTIVE:  Note: Objective measures were completed  at Evaluation unless otherwise noted. 03/22/23:             External Perineal Exam deferred to next session              Internal Pelvic Floor Significant restriction and scar tissue with tenderness  Patient confirms identification and approves PT to assess internal pelvic floor and treatment Yes   PELVIC MMT:    MMT eval  Internal Anal Sphincter 1/5  External Anal Sphincter 2/5 with multimodal cues to help isolate contraction  Puborectalis 1/5  Diastasis Recti 3 finger width diastasis with distortion  (Blank rows = not tested)  TONE: deferred to next session 03/15/23:  COGNITION: Overall cognitive status: Within functional limits for tasks assessed     SENSATION: Light touch: Appears intact Proprioception: Appears intact   FUNCTIONAL TESTS:  Squat: WNL, some Rt knee pain Single leg stance:  Rt: Lt pelvic drop  Lt:Rt pelvic drop Curl-up test: 3 finger width diastasis with distortion inferior/superior to umbilicus Sit-up test: 2/3  GAIT: Comments: WNL  POSTURE: rounded shoulders, forward head, decreased lumbar lordosis, decreased thoracic kyphosis, and posterior pelvic tilt  PELVIC ALIGNMENT: Rt posterior rotation  LUMBARAROM/PROM:  A/PROM A/PROM  Eval (% available)  Flexion 75  Extension 50  Right lateral flexion 50  Left lateral flexion 50  Right rotation 50  Left rotation 75   (Blank rows = not tested)   PALPATION: GENERAL significant scar tissue restriction in abdomen, Rt>Lt; tenderness in lower abdomen; rib cage restriction, lumbar paraspinal tone, Rt>Lt; tenderness at midline throughout lumbar and lower thoracic spine; decreased lumbar spine mobility with PA mobilizations     TODAY'S TREATMENT:  DATE:  04/12/23 Manual: Pt provides verbal consent for internal vaginal/rectal pelvic floor exam. Superficial and deep rectal pelvic  floor muscle release Neuromuscular re-education: Happy baby 2 minutes Butterfly 2 minutes Child's pose 2 minutes Single knee to chest 10x Child's pose/cat cow flow 10x Therapeutic activities: Bowel retraining program and how to use urge drill during Education on pelvic floor muscle wand use with active practice by patient and demonstration with sample wand on model   04/04/23 Manual: Trigger Point Dry Needling  Initial Treatment: Pt instructed on Dry Needling rational, procedures, and possible side effects. Pt instructed to expect mild to moderate muscle soreness later in the day and/or into the next day.  Pt instructed in methods to reduce muscle soreness. Pt instructed to continue prescribed HEP. Patient was educated on signs and symptoms of infection and other risk factors and advised to seek medical attention should they occur.  Patient verbalized understanding of these instructions and education.   Patient Verbal Consent Given: Yes Education Handout Provided: Yes Muscles Treated: Bil L3-S2 multifidi and bil glutes proximal to SIJs Electrical Stimulation Performed: No Treatment Response/Outcome: twitch response/release  Soft tissue mobilization to lumbar paraspinals Neuromuscular re-education: Transversus abdominus training with multimodal cues for improved motor control and breath coordination Supine posterior pelvic tilts with dynadisc 2 x 10 Exercises: Cat cow 10x Tail wags 10x Therapeutic activities: Urge drill for bowel urgency   03/22/23 Manual: Pt provides verbal consent for internal vaginal/rectal pelvic floor exam. Internal rectal pelvic floor muscle exam in side lying Rectal scar tissue mobilization in side lying Deep pelvic floor muscle release in side lying rectally Abdominal scar tissue mobilization/bowel mobilization Neuromuscular re-education: Diaphragmatic breathing for improved abdominal and pelvic floor muscle relaxation/lengthening Pelvic floor  muscle A/ROM training for improved scar tissue mobility and circulation Therapeutic activities: Pelvic floor muscle wand education  PATIENT EDUCATION:  Education details: See above Person educated: Patient Education method: Programmer, multimedia, Demonstration, Actor cues, Verbal cues, and Handouts Education comprehension: verbalized understanding  HOME EXERCISE PROGRAM: [id number]  ASSESSMENT:  CLINICAL IMPRESSION: Pt starting to see some progress with bowels and low back pain, but is spending a lot of time trying to emptying and having unproductive bowel movements. To help with this he was educated in bowel retraining and is agreeable. We performed pelvic floor muscle wand training and he did well and appears comfortable with this. Pelvic floor muscle release performed rectally. He did better this session, but still has a significant amount of tightness and one very tight band of scar tissue on Rt side. He did well with mobility and down training progressions. He will continue to benefit from skilled PT intervention in order to decrease bowel dysfunction, improve abdominal pressure management, and begin/progress functional strengthening program.   OBJECTIVE IMPAIRMENTS: decreased activity tolerance, decreased coordination, decreased endurance, decreased mobility, decreased strength, increased fascial restrictions, increased muscle spasms, impaired tone, postural dysfunction, and pain.   ACTIVITY LIMITATIONS: continence  PARTICIPATION LIMITATIONS: community activity, occupation, and exercise  PERSONAL FACTORS: 1 comorbidity: medical history  are also affecting patient's functional outcome.   REHAB POTENTIAL: Good  CLINICAL DECISION MAKING: Stable/uncomplicated  EVALUATION COMPLEXITY: Low   GOALS: Goals reviewed with patient? Yes  SHORT TERM GOALS: Target date: 04/12/2023 - updated 04/12/23    Pt will be independent with HEP.   Baseline: Goal status: MET 04/12/23  2.  Pt will be  independent with use of squatty potty, relaxed toileting mechanics, and improved bowel movement techniques in order to increase ease of bowel movements and  complete evacuation.   Baseline:  Goal status: MET 04/12/23  3.  Pt will perform daily self bowel mobilization in order to improve frequency and complete evacuation.  Baseline:  Goal status: Met 04/12/23    LONG TERM GOALS: Target date: 08/30/23 - updated 04/12/23  Pt will be independent with advanced HEP.   Baseline:  Goal status: IN PROGRESS 04/12/23  2.  Pt will demonstrate improved mobility in abdominal scar tissue. Baseline:  Goal status: IN PROGRESS 04/12/23  3.  Pt will be able to perform curl-up test in supine without abdominal distortion. Baseline:  Goal status: IN PROGRESS 04/12/23  4.  Pt will be able to perform single leg stance bil without any pelvic drop in order to demonstrate improved functional core strength.  Baseline:  Goal status: IN PROGRESS 04/12/23  5.  Pt will report 75% improvement in fecal urgency. Baseline:  Goal status: IN PROGRESS 04/12/23  6.  Pt will report no episodes of fecal incontinence.  Baseline:  Goal status: IN PROGRESS 04/12/23   PLAN:  PT FREQUENCY: 1-2x/week  PT DURATION: 6 months  PLANNED INTERVENTIONS: 97110-Therapeutic exercises, 97530- Therapeutic activity, 97112- Neuromuscular re-education, 97535- Self Care, 16109- Manual therapy, Dry Needling, and Biofeedback  PLAN FOR NEXT SESSION: Continue manual techniques to pelvic scar tissue and low back; progress core training; progress mobility activities.    Julio Alm, PT, DPT02/26/258:37 AM

## 2023-04-17 ENCOUNTER — Ambulatory Visit: Payer: 59 | Admitting: Family Medicine

## 2023-04-17 VITALS — BP 142/92 | HR 86 | Temp 98.6°F | Ht 69.0 in | Wt 174.0 lb

## 2023-04-17 DIAGNOSIS — B9689 Other specified bacterial agents as the cause of diseases classified elsewhere: Secondary | ICD-10-CM | POA: Diagnosis not present

## 2023-04-17 DIAGNOSIS — R051 Acute cough: Secondary | ICD-10-CM | POA: Diagnosis not present

## 2023-04-17 DIAGNOSIS — J069 Acute upper respiratory infection, unspecified: Secondary | ICD-10-CM

## 2023-04-17 MED ORDER — AZITHROMYCIN 250 MG PO TABS
ORAL_TABLET | ORAL | 0 refills | Status: AC
Start: 2023-04-17 — End: 2023-04-22

## 2023-04-17 MED ORDER — HYDROCODONE BIT-HOMATROP MBR 5-1.5 MG/5ML PO SOLN: 5.0000 mL | Freq: Three times a day (TID) | ORAL | 0 refills | Status: DC | PRN

## 2023-04-17 NOTE — Patient Instructions (Signed)
 I have sent in azithromycin for you to take.  Take 2 tablets today, then 1 tablet daily for the next 4 days.  I have sent in hydrocodone cough syrup for you to take 5 mL once daily in the evening as needed for cough.  This medication may make you sleepy.  Do not drive or operate heavy machinery while taking this medication.  Follow-up with me for new or worsening symptoms.

## 2023-04-17 NOTE — Progress Notes (Signed)
   Acute Office Visit  Subjective:     Patient ID: Edward [name], male    DOB: 05/24/1975, 48 y.o.   MRN: [id number]  Chief Complaint  Patient presents with   Acute Visit    Ongoing for about 2 weeks, cough, headache, sore throat, nasal congestion, earaches. Has tried DayQuil/NyQuil.     HPI Patient is in today for evaluation of productive cough, headaches, ear pain, decreased energy and, for the last 2 weeks. Has tried OTC cough and cold with little relief. Denies known sick contacts Denies abdominal pain, nausea, vomiting, diarrhea, rash, fever, chills, other symptoms.  Medical hx as outlined below.  ROS Per HPI      Objective:    BP (!) 142/92 (BP Location: Left Arm, Patient Position: Sitting)   Pulse 86   Temp 98.6 F (37 C) (Temporal)   Ht 5\' 9"  (1.753 m)   Wt 174 lb (78.9 kg)   SpO2 99%   BMI 25.70 kg/m    Physical Exam Vitals and nursing note reviewed.  Constitutional:      General: He is not in acute distress.    Comments: Appears fatigued  HENT:     Head: Normocephalic and atraumatic.     Right Ear: Tympanic membrane normal.     Left Ear: Tympanic membrane normal.     Nose: Congestion present.     Mouth/Throat:     Mouth: Mucous membranes are moist.     Pharynx: Oropharynx is clear. No oropharyngeal exudate or posterior oropharyngeal erythema.     Comments: Oropharyngeal cobblestoning   Eyes:     Extraocular Movements: Extraocular movements intact.  Cardiovascular:     Rate and Rhythm: Normal rate and regular rhythm.     Heart sounds: Normal heart sounds.  Pulmonary:     Effort: Pulmonary effort is normal. No respiratory distress.     Breath sounds: No wheezing, rhonchi or rales.     Comments: Moderate cough   Musculoskeletal:     Cervical back: Normal range of motion.  Lymphadenopathy:     Cervical: Cervical adenopathy present.  Skin:    General: Skin is warm and dry.  Neurological:     Mental Status: He is alert.    No results found  for any visits on 04/17/23.      Assessment & Plan:   1. Bacterial URI (Primary)  - azithromycin (ZITHROMAX) 250 MG tablet; Take 2 tablets on day 1, then 1 tablet daily on days 2 through 5  Dispense: 6 tablet; Refill: 0  2. Acute cough  - HYDROcodone bit-homatropine (HYCODAN) 5-1.5 MG/5ML syrup; Take 5 mLs by mouth every 8 (eight) hours as needed for cough.  Dispense: 120 mL; Refill: 0    Meds ordered this encounter  Medications   azithromycin (ZITHROMAX) 250 MG tablet    Sig: Take 2 tablets on day 1, then 1 tablet daily on days 2 through 5    Dispense:  6 tablet    Refill:  0   HYDROcodone bit-homatropine (HYCODAN) 5-1.5 MG/5ML syrup    Sig: Take 5 mLs by mouth every 8 (eight) hours as needed for cough.    Dispense:  120 mL    Refill:  0    Return if symptoms worsen or fail to improve.  Moshe Cipro, FNP

## 2023-04-26 NOTE — Progress Notes (Unsigned)
 [name] [address] [place] [address] Phone: [phone] Subjective:   Edward [name], am serving as a scribe for Dr. Antoine Primas.  I'm seeing this patient by the request  of:  Myrlene Broker, MD  CC: Back and neck does have some tightness  VOZ:[id number]  Edward [name] is a 48 y.o. male coming in with complaint of back and neck pain. OMT on 01/26/2023. Also seen for wrist pain. Patient states wrist has improved. Back is in pain. Not like it was at first. But still bothersome and tender. Yard work last week was trying when it came to flexion. R side SI popping when laying and extending hip.  Medications patient has been prescribed:   Taking:         Reviewed prior external information including notes and imaging from previsou exam, outside providers and external EMR if available.   As well as notes that were available from care everywhere and other healthcare systems.  Past medical history, social, surgical and family history all reviewed in electronic medical record.  No pertanent information unless stated regarding to the chief complaint.   Past Medical History:  Diagnosis Date   Arthritis    OA right knee   Family history of prostate cancer    Kidney stones    has a horse shoe kidney   Rectal cancer (HCC)    Thyroid disease     No Known Allergies   Review of Systems:  No headache, visual changes, nausea, vomiting, diarrhea, constipation, dizziness, abdominal pain, skin rash, fevers, chills, night sweats, weight loss, swollen lymph nodes, body aches, joint swelling, chest pain, shortness of breath, mood changes. POSITIVE muscle aches  Objective  Blood pressure 114/84, pulse 95, height 5\' 9"  (1.753 m), weight 174 lb (78.9 kg), SpO2 98%.   General: No apparent distress alert and oriented x3 mood and affect normal, dressed appropriately.  HEENT: Pupils equal, extraocular movements intact  Respiratory: Patient's  speak in full sentences and does not appear short of breath  Cardiovascular: No lower extremity edema, non tender, no erythema  Gait MSK:  Back does have some loss lordosis noted.  Some tenderness to palpation in the paraspinal musculature.  Neck exam does have some loss lordosis noted.  Significant tightness more in the left parascapular area  Osteopathic findings  C6 flexed rotated and side bent left T3 extended rotated and side bent left inhaled rib L2 flexed rotated and side bent right Sacrum right on right  After verbal consent patient was prepped with alcohol swab and with a 25-gauge half inch needle injected into 3 distinct trigger points on the left shoulder region in the trapezius, rhomboid, levator scapula.  Minimal blood loss.  Total of 3 cc of 0.5% Marcaine and 1 cc of Kenalog 40 mg/mL used.  Band-Aid placed postinjection instructions given.  After verbal consent patient was prepped with alcohol swab and with a 21-gauge 2 inch needle injected into the right sacroiliac joint with a total of 0.5 cc of 0.5% Marcaine and 0.5 cc of Kenalog 40 mg/mL.  No blood loss.  Band-Aid placed.  Postinjection instructions given.  After verbal consent patient was prepped with alcohol swab and with a 21-gauge 2 inch needle injected into the left sacroiliac joint with a total of 0.5 cc of 0.5% Marcaine and 1 cc of Kenalog 40 mg/mL.  No blood loss.  Band-Aid placed.  Postinjection instructions given.   Assessment and Plan:  Chronic SI joint  pain Given injections today.  Tolerated the procedure well, discussed icing regimen and home exercises, increase activity slowly.  Follow-up again in 6 to 8 weeks if increased pain consider the possibility advanced imaging with patient having a history of rectal cancer but no fevers, chills, any abnormal weight loss.    Nonallopathic problems  Decision today to treat with OMT was based on Physical Exam  After verbal consent patient was treated with HVLA, ME,  FPR techniques in cervical, rib, thoracic, lumbar, and sacral  areas  Patient tolerated the procedure well with improvement in symptoms  Patient given exercises, stretches and lifestyle modifications  See medications in patient instructions if given  Patient will follow up in 4-8 weeks      The above documentation has been reviewed and is accurate and complete Judi Saa, DO        Note: This dictation was prepared with Dragon dictation along with smaller phrase technology. Any transcriptional errors that result from this process are unintentional.

## 2023-04-27 ENCOUNTER — Ambulatory Visit: Admitting: Family Medicine

## 2023-04-27 ENCOUNTER — Ambulatory Visit: Payer: Self-pay | Attending: Internal Medicine

## 2023-04-27 ENCOUNTER — Encounter: Payer: Self-pay | Admitting: Family Medicine

## 2023-04-27 VITALS — BP 114/84 | HR 95 | Ht 69.0 in | Wt 174.0 lb

## 2023-04-27 DIAGNOSIS — M533 Sacrococcygeal disorders, not elsewhere classified: Secondary | ICD-10-CM

## 2023-04-27 DIAGNOSIS — M9908 Segmental and somatic dysfunction of rib cage: Secondary | ICD-10-CM

## 2023-04-27 DIAGNOSIS — R293 Abnormal posture: Secondary | ICD-10-CM | POA: Insufficient documentation

## 2023-04-27 DIAGNOSIS — M6281 Muscle weakness (generalized): Secondary | ICD-10-CM | POA: Diagnosis present

## 2023-04-27 DIAGNOSIS — M62838 Other muscle spasm: Secondary | ICD-10-CM | POA: Insufficient documentation

## 2023-04-27 DIAGNOSIS — G8929 Other chronic pain: Secondary | ICD-10-CM | POA: Diagnosis not present

## 2023-04-27 DIAGNOSIS — R279 Unspecified lack of coordination: Secondary | ICD-10-CM | POA: Diagnosis present

## 2023-04-27 DIAGNOSIS — M9904 Segmental and somatic dysfunction of sacral region: Secondary | ICD-10-CM | POA: Diagnosis not present

## 2023-04-27 DIAGNOSIS — M5459 Other low back pain: Secondary | ICD-10-CM | POA: Insufficient documentation

## 2023-04-27 DIAGNOSIS — M25512 Pain in left shoulder: Secondary | ICD-10-CM | POA: Diagnosis not present

## 2023-04-27 DIAGNOSIS — M9901 Segmental and somatic dysfunction of cervical region: Secondary | ICD-10-CM | POA: Diagnosis not present

## 2023-04-27 DIAGNOSIS — M9903 Segmental and somatic dysfunction of lumbar region: Secondary | ICD-10-CM

## 2023-04-27 DIAGNOSIS — M9902 Segmental and somatic dysfunction of thoracic region: Secondary | ICD-10-CM | POA: Diagnosis not present

## 2023-04-27 NOTE — Assessment & Plan Note (Signed)
 Given injections again today.  Chronic problem with exacerbation.  Some of that seems to be secondary to the cervical arthritis.  Discussed icing regimen and home exercises, which activities could be helpful and which ones could be hurtful.  Follow-up again in 6 to 8 weeks

## 2023-04-27 NOTE — Assessment & Plan Note (Addendum)
 Given injections today.  Tolerated the procedure well, discussed icing regimen and home exercises, increase activity slowly.  Follow-up again in 6 to 8 weeks if increased pain consider the possibility advanced imaging with patient having a history of rectal cancer but no fevers, chills, any abnormal weight loss.

## 2023-04-27 NOTE — Patient Instructions (Addendum)
 Injection in SI and trigger points Good to see you! See you again in 2-3 months

## 2023-04-27 NOTE — Therapy (Signed)
 OUTPATIENT PHYSICAL THERAPY MALE PELVIC TREATMENT   Patient Name: Edward [name] MRN: [id number] DOB:Jun 22, 1975, 48 y.o., male Today's Date: 04/27/2023  END OF SESSION:  PT End of Session - 04/27/23 0847     Visit Number 5    Date for PT Re-Evaluation 08/30/23    Authorization Type [employer]    PT Start Time [phone]    PT Stop Time 0925    PT Time Calculation (min) 39 min    Activity Tolerance Patient tolerated treatment well    Behavior During Therapy WFL for tasks assessed/performed              Past Medical History:  Diagnosis Date   Arthritis    OA right knee   Family history of prostate cancer    Kidney stones    has a horse shoe kidney   Rectal cancer (HCC)    Thyroid disease    History reviewed. No pertinent surgical history. Patient Active Problem List   Diagnosis Date Noted   Snoring 01/20/2023   Degenerative disc disease, cervical 01/02/2023   Chronic SI joint pain 01/02/2023   Midline back pain 06/08/2022   Right carpal tunnel syndrome 04/25/2022   Adjustment disorder 11/04/2021   Skin mole 08/11/2021   Extensor carpi ulnaris tendinitis 06/01/2021   Routine general medical examination at a health care facility 01/05/2021   Genetic testing 12/28/2020   [facility] joint pain 11/24/2020   Rectal cancer (HCC) 09/22/2020   Insomnia 09/22/2020   Hypothyroid 09/22/2020   Nonallopathic lesion of thoracic region 11/25/2018   Nonallopathic lesion of sacral region 11/25/2018   Nonallopathic lesion of lumbosacral region 11/25/2018   Nonallopathic lesion of rib cage 11/25/2018   Nonallopathic lesion of cervical region 11/25/2018   Patellofemoral arthralgia of right knee 07/25/2018   Scapular dyskinesis 07/25/2018    PCP: Myrlene Broker, MD  REFERRING PROVIDER: Myrlene Broker, MD  REFERRING DIAG: C20 (ICD-10-CM) - Rectal cancer [facility])  THERAPY DIAG:  Muscle weakness (generalized)  Other muscle spasm  Unspecified lack of coordination  Abnormal  posture  Other low back pain  Rationale for Evaluation and Treatment: Rehabilitation  ONSET DATE: 2022  SUBJECTIVE:                                                                                                                                                                                           SUBJECTIVE STATEMENT: Pt states that he got sick about 2 weeks ago with a bad head cold, so he has not been performing exercises or been using wand. About a week and a half ago he had a very bad day with  constipation and stacking. He then took a ducolax and had diarrhea without control - 3 accidents. This also aggravated hemorrhoids. When he was back at work this last week, he had several accidents there as well. He is now better and has not had any recent leaks this week. He is seeing sports medicine to work on his back today.   PAIN: 04/27/23 Are you having pain? Yes NPRS scale: 5/10 Pain location:  low back  Pain type: aching and tight Pain description: constant   Aggravating factors: straining, sleeping positions, prolonged standing  Relieving factors: rest, avoiding straining  PRECAUTIONS: Other: rectal cancer 60454 diagnosis with surgery x2 and chemo  RED FLAGS: None   WEIGHT BEARING RESTRICTIONS: No  FALLS:  Has patient fallen in last 6 months? 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, mild disc/facet degeneration in mid-lower lumbar spine  BOWEL MOVEMENT: Pain with bowel movement: No Type of bowel movement:Type (Bristol Stool Scale) 1-7, Frequency random, and Strain Yes Fully empty rectum: No Leakage: Yes: improved with immodium and ondanestron - 3 or 4 accidents since reversal  Pads: No Fiber supplement: Yes:  tried metamucil in the past  URINATION: Pain with urination: No Fully empty bladder: Yes: - Stream: Strong Urgency: No Frequency: 1-2x/night; few times a day  Leakage:  none Pads: No  INTERCOURSE: Pain with intercourse:  none Climax: WNL Ejaculation: Yes: -   OBJECTIVE:  Note: Objective measures were completed at Evaluation unless otherwise noted. 03/22/23:             External Perineal Exam deferred to next session              Internal Pelvic Floor Significant restriction and scar tissue with tenderness  Patient confirms identification and approves PT to assess internal pelvic floor and treatment Yes   PELVIC MMT:    MMT eval  Internal Anal Sphincter 1/5  External Anal Sphincter 2/5 with multimodal cues to help isolate contraction  Puborectalis 1/5  Diastasis Recti 3 finger width diastasis with distortion  (Blank rows = not tested)  TONE: deferred to next session 03/15/23:  COGNITION: Overall cognitive status: Within functional limits for tasks assessed     SENSATION: Light touch: Appears intact Proprioception: Appears intact   FUNCTIONAL TESTS:  Squat: WNL, some Rt knee pain Single leg stance:  Rt: Lt pelvic drop  Lt:Rt pelvic drop Curl-up test: 3 finger width diastasis with distortion inferior/superior to umbilicus Sit-up test: 2/3  GAIT: Comments: WNL  POSTURE: rounded shoulders, forward head, decreased lumbar lordosis, decreased thoracic kyphosis, and posterior pelvic tilt  PELVIC ALIGNMENT: Rt posterior rotation  LUMBARAROM/PROM:  A/PROM A/PROM  Eval (% available)  Flexion 75  Extension 50  Right lateral flexion 50  Left lateral flexion 50  Right rotation 50  Left rotation 75   (Blank rows = not tested)   PALPATION: GENERAL significant scar tissue restriction in abdomen, Rt>Lt; tenderness in lower abdomen; rib cage restriction, lumbar paraspinal tone, Rt>Lt; tenderness at midline throughout lumbar and lower thoracic spine; decreased  lumbar spine mobility with PA mobilizations     TODAY'S TREATMENT:  DATE:  04/27/23 Neuromuscular re-education: Bil supine UE ball press with transversus abdominus and pelvic floor muscle contractions and breath coordination 10x Supine hip adduction ball press with transversus abdominus and pelvic floor muscle contractions and breath coordination 10x Bridge with hip adduction, transversus abdominus, and pelvic floor muscle 2 x 10 Unilateral side lying UE ball press with transversus abdominus and pelvic floor muscle contractions and breath coordination 10x Unilateral side lying UE ball press with clam shell with transversus abdominus and pelvic floor muscle contractions and breath coordination 10x Seated hip adduction ball press with transversus abdominus and pelvic floor muscle 2 x 10 Seated hip abduction red band with transversus abdominus and pelvic floor muscle 2 x 10 Seated resisted march red band with transversus abdominus and pelvic floor muscle 2 x 10 Exercises: Lower trunk rotation 10x Single knee to chest 5x bil Supine spinal twist with piriformis stretch 1x bil Seated forward fold 10 breaths    04/12/23 Manual: Pt provides verbal consent for internal vaginal/rectal pelvic floor exam. Superficial and deep rectal pelvic floor muscle release Neuromuscular re-education: Happy baby 2 minutes Butterfly 2 minutes Child's pose 2 minutes Single knee to chest 10x Child's pose/cat cow flow 10x Therapeutic activities: Bowel retraining program and how to use urge drill during Education on pelvic floor muscle wand use with active practice by patient and demonstration with sample wand on model   04/04/23 Manual: Trigger Point Dry Needling  Initial Treatment: Pt instructed on Dry Needling rational, procedures, and possible side effects. Pt instructed to expect  mild to moderate muscle soreness later in the day and/or into the next day.  Pt instructed in methods to reduce muscle soreness. Pt instructed to continue prescribed HEP. Patient was educated on signs and symptoms of infection and other risk factors and advised to seek medical attention should they occur.  Patient verbalized understanding of these instructions and education.   Patient Verbal Consent Given: Yes Education Handout Provided: Yes Muscles Treated: Bil L3-S2 multifidi and bil glutes proximal to SIJs Electrical Stimulation Performed: No Treatment Response/Outcome: twitch response/release  Soft tissue mobilization to lumbar paraspinals Neuromuscular re-education: Transversus abdominus training with multimodal cues for improved motor control and breath coordination Supine posterior pelvic tilts with dynadisc 2 x 10 Exercises: Cat cow 10x Tail wags 10x Therapeutic activities: Urge drill for bowel urgency  PATIENT EDUCATION:  Education details: See above Person educated: Patient Education method: Explanation, Demonstration, Tactile cues, Verbal cues, and Handouts Education comprehension: verbalized understanding  HOME EXERCISE PROGRAM: [id number]  ASSESSMENT:  CLINICAL IMPRESSION: Pt has had a difficult 2 weeks with diarrhea and more episodes of fecal incontinence. Due to this, we decided to focus on core strengthening progressions today to help strengthen pelvic floor muscles and work on deep core coordination. He did well with this, but does demonstrate core weakness and inability to control pressure as activities get more challenging. We discussed increased awareness of this as he returns to yoga and making sure he modifies when necessary. He will continue to benefit from skilled PT intervention in order to decrease bowel dysfunction, improve abdominal pressure management, and begin/progress functional strengthening program.   OBJECTIVE IMPAIRMENTS: decreased activity  tolerance, decreased coordination, decreased endurance, decreased mobility, decreased strength, increased fascial restrictions, increased muscle spasms, impaired tone, postural dysfunction, and pain.   ACTIVITY LIMITATIONS: continence  PARTICIPATION LIMITATIONS: community activity, occupation, and exercise  PERSONAL FACTORS: 1 comorbidity: medical history  are also affecting patient's functional outcome.   REHAB POTENTIAL: Good  CLINICAL DECISION MAKING: Stable/uncomplicated  EVALUATION COMPLEXITY: Low   GOALS: Goals reviewed with patient? Yes  SHORT TERM GOALS: Target date: 04/12/2023 - updated 04/12/23    Pt will be independent with HEP.   Baseline: Goal status: MET 04/12/23  2.  Pt will be independent with use of squatty potty, relaxed toileting mechanics, and improved bowel movement techniques in order to increase ease of bowel movements and complete evacuation.   Baseline:  Goal status: MET 04/12/23  3.  Pt will perform daily self bowel mobilization in order to improve frequency and complete evacuation.  Baseline:  Goal status: Met 04/12/23    LONG TERM GOALS: Target date: 08/30/23 - updated 04/12/23  Pt will be independent with advanced HEP.   Baseline:  Goal status: IN PROGRESS 04/12/23  2.  Pt will demonstrate improved mobility in abdominal scar tissue. Baseline:  Goal status: IN PROGRESS 04/12/23  3.  Pt will be able to perform curl-up test in supine without abdominal distortion. Baseline:  Goal status: IN PROGRESS 04/12/23  4.  Pt will be able to perform single leg stance bil without any pelvic drop in order to demonstrate improved functional core strength.  Baseline:  Goal status: IN PROGRESS 04/12/23  5.  Pt will report 75% improvement in fecal urgency. Baseline:  Goal status: IN PROGRESS 04/12/23  6.  Pt will report no episodes of fecal incontinence.  Baseline:  Goal status: IN PROGRESS 04/12/23   PLAN:  PT FREQUENCY: 1-2x/week  PT DURATION: 6  months  PLANNED INTERVENTIONS: 97110-Therapeutic exercises, 97530- Therapeutic activity, 97112- Neuromuscular re-education, 97535- Self Care, 16109- Manual therapy, Dry Needling, and Biofeedback  PLAN FOR NEXT SESSION: Continue manual techniques to pelvic scar tissue and low back; progress core training; progress mobility activities.    Julio Alm, PT, DPT03/13/259:27 AM

## 2023-05-18 ENCOUNTER — Ambulatory Visit: Payer: 59 | Attending: Internal Medicine

## 2023-05-18 DIAGNOSIS — M6281 Muscle weakness (generalized): Secondary | ICD-10-CM | POA: Insufficient documentation

## 2023-05-18 DIAGNOSIS — R279 Unspecified lack of coordination: Secondary | ICD-10-CM | POA: Insufficient documentation

## 2023-05-18 DIAGNOSIS — M5459 Other low back pain: Secondary | ICD-10-CM | POA: Insufficient documentation

## 2023-05-18 DIAGNOSIS — M62838 Other muscle spasm: Secondary | ICD-10-CM | POA: Insufficient documentation

## 2023-05-18 DIAGNOSIS — R293 Abnormal posture: Secondary | ICD-10-CM | POA: Diagnosis present

## 2023-05-18 NOTE — Therapy (Signed)
 OUTPATIENT PHYSICAL THERAPY MALE PELVIC TREATMENT   Patient Name: Edward [name] MRN: [id number] DOB:[birth year], 48 y.o., male Today's Date: 05/18/2023  END OF SESSION:  PT End of Session - 05/18/23 1235     Visit Number 6    Date for PT Re-Evaluation 08/30/23    Authorization Type [employer]    PT Start Time 1232    PT Stop Time 1312    PT Time Calculation (min) 40 min    Activity Tolerance Patient tolerated treatment well    Behavior During Therapy WFL for tasks assessed/performed              Past Medical History:  Diagnosis Date   Arthritis    OA right knee   Family history of prostate cancer    Kidney stones    has a horse shoe kidney   Rectal cancer (HCC)    Thyroid disease    History reviewed. No pertinent surgical history. Patient Active Problem List   Diagnosis Date Noted   Snoring 01/20/2023   Degenerative disc disease, cervical 01/02/2023   Chronic SI joint pain 01/02/2023   Midline back pain 06/08/2022   Right carpal tunnel syndrome 04/25/2022   Adjustment disorder 11/04/2021   Skin mole 08/11/2021   Extensor carpi ulnaris tendinitis 06/01/2021   Routine general medical examination at a health care facility 01/05/2021   Genetic testing 12/28/2020   [facility] joint pain 11/24/2020   Rectal cancer (HCC) 09/22/2020   Insomnia 09/22/2020   Hypothyroid 09/22/2020   Nonallopathic lesion of thoracic region 11/25/2018   Nonallopathic lesion of sacral region 11/25/2018   Nonallopathic lesion of lumbosacral region 11/25/2018   Nonallopathic lesion of rib cage 11/25/2018   Nonallopathic lesion of cervical region 11/25/2018   Trigger point of left shoulder region 08/29/2018   Patellofemoral arthralgia of right knee 07/25/2018   Scapular dyskinesis 07/25/2018    PCP: Myrlene Broker, MD  REFERRING PROVIDER: Myrlene Broker, MD  REFERRING DIAG: C20 (ICD-10-CM) - Rectal cancer [facility])  THERAPY DIAG:  Muscle weakness (generalized)  Other muscle  spasm  Unspecified lack of coordination  Abnormal posture  Other low back pain  Rationale for Evaluation and Treatment: Rehabilitation  ONSET DATE: 2022  SUBJECTIVE:                                                                                                                                                                                           SUBJECTIVE STATEMENT: Pt states that he started citracel and had a difficult day Sunday into Monday and then had an accident Monday. But now he feels like it is helpful. He is not  having days without bowel movements like when using zofran. He is doing hot yoga 1x/week and even went for a run. He's been having a lot more lowe back pain and has difficulty returning to standing after bending over. He had triger point injections, but they were not very helpful .   PAIN: 05/17/23 Are you having pain? Yes NPRS scale: 7/10 when bending and standing back up; 4/10 when just sitting Pain location:  low back  Pain type: aching and tight Pain description: constant   Aggravating factors: straining, sleeping positions, prolonged standing - coming up from bending Relieving factors: rest, avoiding straining  PRECAUTIONS: Other: rectal cancer 30865 diagnosis with surgery x2 and chemo  RED FLAGS: None   WEIGHT BEARING RESTRICTIONS: No  FALLS:  Has patient fallen in last 6 months? 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, mild disc/facet degeneration in mid-lower lumbar spine  BOWEL MOVEMENT: Pain with bowel movement: No Type of bowel movement:Type (Bristol Stool Scale) 1-7, Frequency random, and Strain Yes Fully empty rectum: No Leakage: Yes: improved with immodium and ondanestron - 3 or 4  accidents since reversal  Pads: No Fiber supplement: Yes: tried metamucil in the past  URINATION: Pain with urination: No Fully empty bladder: Yes: - Stream: Strong Urgency: No Frequency: 1-2x/night; few times a day  Leakage:  none Pads: No  INTERCOURSE: Pain with intercourse:  none Climax: WNL Ejaculation: Yes: -   OBJECTIVE:  Note: Objective measures were completed at Evaluation unless otherwise noted. 03/22/23:             External Perineal Exam deferred to next session              Internal Pelvic Floor Significant restriction and scar tissue with tenderness  Patient confirms identification and approves PT to assess internal pelvic floor and treatment Yes   PELVIC MMT:    MMT eval  Internal Anal Sphincter 1/5  External Anal Sphincter 2/5 with multimodal cues to help isolate contraction  Puborectalis 1/5  Diastasis Recti 3 finger width diastasis with distortion  (Blank rows = not tested)  TONE: deferred to next session 03/15/23:  COGNITION: Overall cognitive status: Within functional limits for tasks assessed     SENSATION: Light touch: Appears intact Proprioception: Appears intact   FUNCTIONAL TESTS:  Squat: WNL, some Rt knee pain Single leg stance:  Rt: Lt pelvic drop  Lt:Rt pelvic drop Curl-up test: 3 finger width diastasis with distortion inferior/superior to umbilicus Sit-up test: 2/3  GAIT: Comments: WNL  POSTURE: rounded shoulders, forward head, decreased lumbar lordosis, decreased thoracic kyphosis, and posterior pelvic tilt  PELVIC ALIGNMENT: Rt posterior rotation  LUMBARAROM/PROM:  A/PROM A/PROM  Eval (% available)  Flexion 75  Extension 50  Right lateral flexion 50  Left lateral flexion 50  Right rotation 50  Left rotation 75   (Blank rows = not tested)   PALPATION: GENERAL significant scar tissue restriction in abdomen, Rt>Lt; tenderness in lower abdomen; rib cage restriction, lumbar paraspinal tone, Rt>Lt; tenderness at  midline throughout lumbar and lower thoracic spine; decreased lumbar spine mobility with PA mobilizations     TODAY'S TREATMENT:  DATE:  05/17/23 Manual: Trigger Point Dry Needling  Initial Treatment: Pt instructed on Dry Needling rational, procedures, and possible side effects. Pt instructed to expect mild to moderate muscle soreness later in the day and/or into the next day.  Pt instructed in methods to reduce muscle soreness. Pt instructed to continue prescribed HEP. Patient was educated on signs and symptoms of infection and other risk factors and advised to seek medical attention should they occur.  Patient verbalized understanding of these instructions and education.   Patient Verbal Consent Given: Yes Education Handout Provided: Yes Muscles Treated: Bil L3-S2 multifidi and bil glutes proximal to SIJs Electrical Stimulation Performed: No Treatment Response/Outcome: twitch response/release  Soft tissue mobilization to lumbar paraspinals and bil glutes  Exercises: Prone hip flexor stretches with overpressure Supine piriformis stretches with overpressure Supine hamstring stretches with overpressure   04/27/23 Neuromuscular re-education: Bil supine UE ball press with transversus abdominus and pelvic floor muscle contractions and breath coordination 10x Supine hip adduction ball press with transversus abdominus and pelvic floor muscle contractions and breath coordination 10x Bridge with hip adduction, transversus abdominus, and pelvic floor muscle 2 x 10 Unilateral side lying UE ball press with transversus abdominus and pelvic floor muscle contractions and breath coordination 10x Unilateral side lying UE ball press with clam shell with transversus abdominus and pelvic floor muscle contractions and breath coordination 10x Seated hip adduction ball press with  transversus abdominus and pelvic floor muscle 2 x 10 Seated hip abduction red band with transversus abdominus and pelvic floor muscle 2 x 10 Seated resisted march red band with transversus abdominus and pelvic floor muscle 2 x 10 Exercises: Lower trunk rotation 10x Single knee to chest 5x bil Supine spinal twist with piriformis stretch 1x bil Seated forward fold 10 breaths    04/12/23 Manual: Pt provides verbal consent for internal vaginal/rectal pelvic floor exam. Superficial and deep rectal pelvic floor muscle release Neuromuscular re-education: Happy baby 2 minutes Butterfly 2 minutes Child's pose 2 minutes Single knee to chest 10x Child's pose/cat cow flow 10x Therapeutic activities: Bowel retraining program and how to use urge drill during Education on pelvic floor muscle wand use with active practice by patient and demonstration with sample wand on model  PATIENT EDUCATION:  Education details: See above Person educated: Patient Education method: Explanation, Demonstration, Tactile cues, Verbal cues, and Handouts Education comprehension: verbalized understanding  HOME EXERCISE PROGRAM: [id number]  ASSESSMENT:  CLINICAL IMPRESSION: Pt has been able to start yoga and went on a run, but is still having some difficulty with sitting on the toilet for long periods of time and had one episode of fecal incontinence. He has started taking fiber instead of Zofran and feels like after initial adjustment period it is helping. He is having more low back pain as he begins to return to more activities that he used to do. He is still working on core activation exercises. We performed dry needling to lumbar and sacral paraspinals in addition to bil glutes with significant twitch response; he demonstrated increased tightness in Lt glutes and Rt paraspinals. Large L4 Rt facing rotation noted with needles. Good tolerance to other manual techniques and stretches to further improve restriction. He  will continue to benefit from skilled PT intervention in order to decrease bowel dysfunction, improve abdominal pressure management, and begin/progress functional strengthening program.   OBJECTIVE IMPAIRMENTS: decreased activity tolerance, decreased coordination, decreased endurance, decreased mobility, decreased strength, increased fascial restrictions, increased muscle spasms, impaired tone, postural dysfunction, and pain.  ACTIVITY LIMITATIONS: continence  PARTICIPATION LIMITATIONS: community activity, occupation, and exercise  PERSONAL FACTORS: 1 comorbidity: medical history  are also affecting patient's functional outcome.   REHAB POTENTIAL: Good  CLINICAL DECISION MAKING: Stable/uncomplicated  EVALUATION COMPLEXITY: Low   GOALS: Goals reviewed with patient? Yes  SHORT TERM GOALS: Target date: 04/12/2023 - updated 04/12/23    Pt will be independent with HEP.   Baseline: Goal status: MET 04/12/23  2.  Pt will be independent with use of squatty potty, relaxed toileting mechanics, and improved bowel movement techniques in order to increase ease of bowel movements and complete evacuation.   Baseline:  Goal status: MET 04/12/23  3.  Pt will perform daily self bowel mobilization in order to improve frequency and complete evacuation.  Baseline:  Goal status: Met 04/12/23    LONG TERM GOALS: Target date: 08/30/23 - updated 04/12/23 - updated 05/18/23  Pt will be independent with advanced HEP.   Baseline:  Goal status: IN PROGRESS 05/18/23  2.  Pt will demonstrate improved mobility in abdominal scar tissue. Baseline:  Goal status: IN PROGRESS 05/18/23  3.  Pt will be able to perform curl-up test in supine without abdominal distortion. Baseline:  Goal status: IN PROGRESS 05/18/23  4.  Pt will be able to perform single leg stance bil without any pelvic drop in order to demonstrate improved functional core strength.  Baseline:  Goal status: IN PROGRESS 05/18/23  5.  Pt will report  75% improvement in fecal urgency. Baseline:  Goal status: IN PROGRESS 05/18/23  6.  Pt will report no episodes of fecal incontinence.  Baseline:  Goal status: IN PROGRESS 05/18/23   PLAN:  PT FREQUENCY: 1-2x/week  PT DURATION: 6 months  PLANNED INTERVENTIONS: 97110-Therapeutic exercises, 97530- Therapeutic activity, 97112- Neuromuscular re-education, 97535- Self Care, 82956- Manual therapy, Dry Needling, and Biofeedback  PLAN FOR NEXT SESSION: Continue manual techniques to pelvic scar tissue and low back; progress core training; progress mobility activities.    Julio Alm, PT, DPT04/03/251:14 PM

## 2023-05-29 ENCOUNTER — Ambulatory Visit: Payer: 59

## 2023-05-29 DIAGNOSIS — R293 Abnormal posture: Secondary | ICD-10-CM

## 2023-05-29 DIAGNOSIS — M6281 Muscle weakness (generalized): Secondary | ICD-10-CM

## 2023-05-29 DIAGNOSIS — M5459 Other low back pain: Secondary | ICD-10-CM

## 2023-05-29 DIAGNOSIS — M62838 Other muscle spasm: Secondary | ICD-10-CM

## 2023-05-29 DIAGNOSIS — R279 Unspecified lack of coordination: Secondary | ICD-10-CM

## 2023-05-29 NOTE — Therapy (Signed)
 OUTPATIENT PHYSICAL THERAPY MALE PELVIC TREATMENT   Patient Name: Edward [name] MRN: [id number] DOB:January 30, 1976, 48 y.o., male Today's Date: 05/29/2023  END OF SESSION:  PT End of Session - 05/29/23 0852     Visit Number 7    Date for PT Re-Evaluation 08/30/23    Authorization Type [employer]et[employer]    PT Start Time [phone]    PT Stop Time 0927    PT Time Calculation (min) 38 min    Activity Tolerance Patient tolerated treatment well    Behavior During Therapy WFL for tasks assessed/performed              Past Medical History:  Diagnosis Date   Arthritis    OA right knee   Family history of prostate cancer    Kidney stones    has a horse shoe kidney   Rectal cancer (HCC)    Thyroid disease    History reviewed. No pertinent surgical history. Patient Active Problem List   Diagnosis Date Noted   Snoring 01/20/2023   Degenerative disc disease, cervical 01/02/2023   Chronic SI joint pain 01/02/2023   Midline back pain 06/08/2022   Right carpal tunnel syndrome 04/25/2022   Adjustment disorder 11/04/2021   Skin mole 08/11/2021   Extensor carpi ulnaris tendinitis 06/01/2021   Routine general medical examination at a health care facility 01/05/2021   Genetic testing 12/28/2020   [facility] joint pain 11/24/2020   Rectal cancer (HCC) 09/22/2020   Insomnia 09/22/2020   Hypothyroid 09/22/2020   Nonallopathic lesion of thoracic region 11/25/2018   Nonallopathic lesion of sacral region 11/25/2018   Nonallopathic lesion of lumbosacral region 11/25/2018   Nonallopathic lesion of rib cage 11/25/2018   Nonallopathic lesion of cervical region 11/25/2018   Trigger point of left shoulder region 08/29/2018   Patellofemoral arthralgia of right knee 07/25/2018   Scapular dyskinesis 07/25/2018    PCP: Adelia Homestead, MD  REFERRING PROVIDER: Adelia Homestead, MD  REFERRING DIAG: C20 (ICD-10-CM) - Rectal cancer [facility])  THERAPY DIAG:  Muscle weakness (generalized)  Other muscle  spasm  Unspecified lack of coordination  Abnormal posture  Other low back pain  Rationale for Evaluation and Treatment: Rehabilitation  ONSET DATE: 2022  SUBJECTIVE:                                                                                                                                                                                           SUBJECTIVE STATEMENT: Pt states that he had a good week last week with no accidents. He had a couple of beers which gave him diarrhea and he had 3 accidents that night. He  states that alcohol does not normally do this to him. He states that back is still stiff, but not as bad as it was.   PAIN: 05/29/23 Are you having pain? Yes NPRS scale: 5/10 Pain location:  low back  Pain type: aching and tight Pain description: constant   Aggravating factors: straining, sleeping positions, prolonged standing - coming up from bending Relieving factors: rest, avoiding straining  PRECAUTIONS: Other: rectal cancer 02725 diagnosis with surgery x2 and chemo  RED FLAGS: None   WEIGHT BEARING RESTRICTIONS: No  FALLS:  Has patient fallen in last 6 months? 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, mild disc/facet degeneration in mid-lower lumbar spine  BOWEL MOVEMENT: Pain with bowel movement: No Type of bowel movement:Type (Bristol Stool Scale) 1-7, Frequency random, and Strain Yes Fully empty rectum: No Leakage: Yes: improved with immodium and ondanestron - 3 or 4 accidents since reversal  Pads: No Fiber supplement: Yes: tried metamucil in the past  URINATION: Pain with urination: No Fully empty bladder: Yes: - Stream: Strong Urgency: No Frequency: 1-2x/night; few times a day   Leakage:  none Pads: No  INTERCOURSE: Pain with intercourse:  none Climax: WNL Ejaculation: Yes: -   OBJECTIVE:  Note: Objective measures were completed at Evaluation unless otherwise noted. 03/22/23:             External Perineal Exam deferred to next session              Internal Pelvic Floor Significant restriction and scar tissue with tenderness  Patient confirms identification and approves PT to assess internal pelvic floor and treatment Yes   PELVIC MMT:    MMT eval  Internal Anal Sphincter 1/5  External Anal Sphincter 2/5 with multimodal cues to help isolate contraction  Puborectalis 1/5  Diastasis Recti 3 finger width diastasis with distortion  (Blank rows = not tested)  TONE: deferred to next session 03/15/23:  COGNITION: Overall cognitive status: Within functional limits for tasks assessed     SENSATION: Light touch: Appears intact Proprioception: Appears intact   FUNCTIONAL TESTS:  Squat: WNL, some Rt knee pain Single leg stance:  Rt: Lt pelvic drop  Lt:Rt pelvic drop Curl-up test: 3 finger width diastasis with distortion inferior/superior to umbilicus Sit-up test: 2/3  GAIT: Comments: WNL  POSTURE: rounded shoulders, forward head, decreased lumbar lordosis, decreased thoracic kyphosis, and posterior pelvic tilt  PELVIC ALIGNMENT: Rt posterior rotation  LUMBARAROM/PROM:  A/PROM A/PROM  Eval (% available)  Flexion 75  Extension 50  Right lateral flexion 50  Left lateral flexion 50  Right rotation 50  Left rotation 75   (Blank rows = not tested)   PALPATION: GENERAL significant scar tissue restriction in abdomen, Rt>Lt; tenderness in lower abdomen; rib cage restriction, lumbar paraspinal tone, Rt>Lt; tenderness at midline throughout lumbar and lower thoracic spine; decreased lumbar spine mobility with PA mobilizations     TODAY'S TREATMENT:  DATE:  05/29/23 Therapeutic Activities: Urge drill for fecal incontinence Review of core strengthening program Manual: Abdominal scar tissue mobilization  Soft tissue mobilization to lumbar paraspinals, obliques, QL, and glutes bil Exercises: Prone hip flexor stretches with overpressure Supine piriformis stretches with overpressure Supine hamstring stretches with overpressure    05/17/23 Manual: Trigger Point Dry Needling  Initial Treatment: Pt instructed on Dry Needling rational, procedures, and possible side effects. Pt instructed to expect mild to moderate muscle soreness later in the day and/or into the next day.  Pt instructed in methods to reduce muscle soreness. Pt instructed to continue prescribed HEP. Patient was educated on signs and symptoms of infection and other risk factors and advised to seek medical attention should they occur.  Patient verbalized understanding of these instructions and education.   Patient Verbal Consent Given: Yes Education Handout Provided: Yes Muscles Treated: Bil L3-S2 multifidi and bil glutes proximal to SIJs Electrical Stimulation Performed: No Treatment Response/Outcome: twitch response/release  Soft tissue mobilization to lumbar paraspinals and bil glutes  Exercises: Prone hip flexor stretches with overpressure Supine piriformis stretches with overpressure Supine hamstring stretches with overpressure   04/27/23 Neuromuscular re-education: Bil supine UE ball press with transversus abdominus and pelvic floor muscle contractions and breath coordination 10x Supine hip adduction ball press with transversus abdominus and pelvic floor muscle contractions and breath coordination 10x Bridge with hip adduction, transversus abdominus, and pelvic floor muscle 2 x 10 Unilateral side lying UE ball press with transversus abdominus and pelvic floor muscle contractions and breath coordination 10x Unilateral side lying UE ball  press with clam shell with transversus abdominus and pelvic floor muscle contractions and breath coordination 10x Seated hip adduction ball press with transversus abdominus and pelvic floor muscle 2 x 10 Seated hip abduction red band with transversus abdominus and pelvic floor muscle 2 x 10 Seated resisted march red band with transversus abdominus and pelvic floor muscle 2 x 10 Exercises: Lower trunk rotation 10x Single knee to chest 5x bil Supine spinal twist with piriformis stretch 1x bil Seated forward fold 10 breaths    PATIENT EDUCATION:  Education details: See above Person educated: Patient Education method: Explanation, Demonstration, Tactile cues, Verbal cues, and Handouts Education comprehension: verbalized understanding  HOME EXERCISE PROGRAM: [id number]  ASSESSMENT:  CLINICAL IMPRESSION: Pt doing better with low back pain today. He had had episodes of fecal incontinence when stools were liquid. To help work on this, we reviewed pelvic floor muscle contractions in urge drill to help with control and getting to the bathroom; believe this is partly habit at this point because rectal/anal sensors don't feel like they have to hold stool in at this point when it is liquid texture. He is agreeable to try urge drill to work on this. We discussed the importance of keeping up with core strengthening activities to help improve deep core strength as well. Good tolerance to all manual techniques; we returned to abdominal scar tissue mobilization due to feeling unable to mobilize this tissue adequately at home. He will continue to benefit from skilled PT intervention in order to decrease bowel dysfunction, improve abdominal pressure management, and begin/progress functional strengthening program.   OBJECTIVE IMPAIRMENTS: decreased activity tolerance, decreased coordination, decreased endurance, decreased mobility, decreased strength, increased fascial restrictions, increased muscle spasms,  impaired tone, postural dysfunction, and pain.   ACTIVITY LIMITATIONS: continence  PARTICIPATION LIMITATIONS: community activity, occupation, and exercise  PERSONAL FACTORS: 1 comorbidity: medical history  are also affecting patient's functional outcome.   REHAB POTENTIAL: Good  CLINICAL DECISION MAKING: Stable/uncomplicated  EVALUATION COMPLEXITY: Low   GOALS: Goals reviewed with patient? Yes  SHORT TERM GOALS: Target date: 04/12/2023 - updated 04/12/23    Pt will be independent with HEP.   Baseline: Goal status: MET 04/12/23  2.  Pt will be independent with use of squatty potty, relaxed toileting mechanics, and improved bowel movement techniques in order to increase ease of bowel movements and complete evacuation.   Baseline:  Goal status: MET 04/12/23  3.  Pt will perform daily self bowel mobilization in order to improve frequency and complete evacuation.  Baseline:  Goal status: Met 04/12/23    LONG TERM GOALS: Target date: 08/30/23 - updated 04/12/23 - updated 05/18/23  Pt will be independent with advanced HEP.   Baseline:  Goal status: IN PROGRESS 05/18/23  2.  Pt will demonstrate improved mobility in abdominal scar tissue. Baseline:  Goal status: IN PROGRESS 05/18/23  3.  Pt will be able to perform curl-up test in supine without abdominal distortion. Baseline:  Goal status: IN PROGRESS 05/18/23  4.  Pt will be able to perform single leg stance bil without any pelvic drop in order to demonstrate improved functional core strength.  Baseline:  Goal status: IN PROGRESS 05/18/23  5.  Pt will report 75% improvement in fecal urgency. Baseline:  Goal status: IN PROGRESS 05/18/23  6.  Pt will report no episodes of fecal incontinence.  Baseline:  Goal status: IN PROGRESS 05/18/23   PLAN:  PT FREQUENCY: 1-2x/week  PT DURATION: 6 months  PLANNED INTERVENTIONS: 97110-Therapeutic exercises, 97530- Therapeutic activity, 97112- Neuromuscular re-education, 97535- Self Care,  [id number]- Manual therapy, Dry Needling, and Biofeedback  PLAN FOR NEXT SESSION: Continue manual techniques to pelvic scar tissue and low back; progress core training; progress mobility activities.    Verlena Glenn, PT, DPT04/14/259:34 AM

## 2023-06-14 ENCOUNTER — Ambulatory Visit: Payer: Self-pay

## 2023-06-14 DIAGNOSIS — R279 Unspecified lack of coordination: Secondary | ICD-10-CM

## 2023-06-14 DIAGNOSIS — M6281 Muscle weakness (generalized): Secondary | ICD-10-CM | POA: Diagnosis not present

## 2023-06-14 DIAGNOSIS — R293 Abnormal posture: Secondary | ICD-10-CM

## 2023-06-14 DIAGNOSIS — M5459 Other low back pain: Secondary | ICD-10-CM

## 2023-06-14 DIAGNOSIS — M62838 Other muscle spasm: Secondary | ICD-10-CM

## 2023-06-14 NOTE — Therapy (Signed)
 OUTPATIENT PHYSICAL THERAPY MALE PELVIC TREATMENT   Patient Name: Edward [name] MRN: [id number] DOB:16-Oct-1975, 48 y.o., male Today's Date: 06/14/2023  END OF SESSION:  PT End of Session - 06/14/23 0850     Visit Number 8    Date for PT Re-Evaluation 08/30/23    Authorization Type [employer]    PT Start Time 0845    PT Stop Time 0925    PT Time Calculation (min) 40 min    Activity Tolerance Patient tolerated treatment well    Behavior During Therapy WFL for tasks assessed/performed              Past Medical History:  Diagnosis Date   Arthritis    OA right knee   Family history of prostate cancer    Kidney stones    has a horse shoe kidney   Rectal cancer (HCC)    Thyroid  disease    History reviewed. No pertinent surgical history. Patient Active Problem List   Diagnosis Date Noted   Snoring 01/20/2023   Degenerative disc disease, cervical 01/02/2023   Chronic SI joint pain 01/02/2023   Midline back pain 06/08/2022   Right carpal tunnel syndrome 04/25/2022   Adjustment disorder 11/04/2021   Skin mole 08/11/2021   Extensor carpi ulnaris tendinitis 06/01/2021   Routine general medical examination at a health care facility 01/05/2021   Genetic testing 12/28/2020   [facility] joint pain 11/24/2020   Rectal cancer (HCC) 09/22/2020   Insomnia 09/22/2020   Hypothyroid 09/22/2020   Nonallopathic lesion of thoracic region 11/25/2018   Nonallopathic lesion of sacral region 11/25/2018   Nonallopathic lesion of lumbosacral region 11/25/2018   Nonallopathic lesion of rib cage 11/25/2018   Nonallopathic lesion of cervical region 11/25/2018   Trigger point of left shoulder region 08/29/2018   Patellofemoral arthralgia of right knee 07/25/2018   Scapular dyskinesis 07/25/2018    PCP: Adelia Homestead, MD  REFERRING PROVIDER: Adelia Homestead, MD  REFERRING DIAG: C20 (ICD-10-CM) - Rectal cancer [facility])  THERAPY DIAG:  Muscle weakness (generalized)  Other muscle  spasm  Unspecified lack of coordination  Abnormal posture  Other low back pain  Rationale for Evaluation and Treatment: Rehabilitation  ONSET DATE: 2022  SUBJECTIVE:                                                                                                                                                                                           SUBJECTIVE STATEMENT: Pt states that he did have an episode of fecal incontinence several days ago when he was squatting down.   PAIN: 06/13/23 Are you having pain? Yes NPRS scale: 5/10  Pain location:  low back  Pain type: aching and tight Pain description: constant   Aggravating factors: straining, sleeping positions, prolonged standing - coming up from bending Relieving factors: rest, avoiding straining  PRECAUTIONS: Other: rectal cancer 16109 diagnosis with surgery x2 and chemo  RED FLAGS: None   WEIGHT BEARING RESTRICTIONS: No  FALLS:  Has patient fallen in last 6 months? 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, mild disc/facet degeneration in mid-lower lumbar spine  BOWEL MOVEMENT: Pain with bowel movement: No Type of bowel movement:Type (Bristol Stool Scale) 1-7, Frequency random, and Strain Yes Fully empty rectum: No Leakage: Yes: improved with immodium and ondanestron - 3 or 4 accidents since reversal  Pads: No Fiber supplement: Yes: tried metamucil in the past  URINATION: Pain with urination: No Fully empty bladder: Yes: - Stream: Strong Urgency: No Frequency: 1-2x/night; few times a day  Leakage:  none Pads: No  INTERCOURSE: Pain with intercourse:  none Climax: WNL Ejaculation: Yes: -   OBJECTIVE:  Note: Objective measures were completed at  Evaluation unless otherwise noted. 03/22/23:             External Perineal Exam deferred to next session              Internal Pelvic Floor Significant restriction and scar tissue with tenderness  Patient confirms identification and approves PT to assess internal pelvic floor and treatment Yes   PELVIC MMT:    MMT eval  Internal Anal Sphincter 1/5  External Anal Sphincter 2/5 with multimodal cues to help isolate contraction  Puborectalis 1/5  Diastasis Recti 3 finger width diastasis with distortion  (Blank rows = not tested)  TONE: deferred to next session 03/15/23:  COGNITION: Overall cognitive status: Within functional limits for tasks assessed     SENSATION: Light touch: Appears intact Proprioception: Appears intact   FUNCTIONAL TESTS:  Squat: WNL, some Rt knee pain Single leg stance:  Rt: Lt pelvic drop  Lt:Rt pelvic drop Curl-up test: 3 finger width diastasis with distortion inferior/superior to umbilicus Sit-up test: 2/3  GAIT: Comments: WNL  POSTURE: rounded shoulders, forward head, decreased lumbar lordosis, decreased thoracic kyphosis, and posterior pelvic tilt  PELVIC ALIGNMENT: Rt posterior rotation  LUMBARAROM/PROM:  A/PROM A/PROM  Eval (% available)  Flexion 75  Extension 50  Right lateral flexion 50  Left lateral flexion 50  Right rotation 50  Left rotation 75   (Blank rows = not tested)   PALPATION: GENERAL significant scar tissue restriction in abdomen, Rt>Lt; tenderness in lower abdomen; rib cage restriction, lumbar paraspinal tone, Rt>Lt; tenderness at midline throughout lumbar and lower thoracic spine; decreased lumbar spine mobility with PA mobilizations     TODAY'S TREATMENT:  DATE:  06/14/23 Manual: Trigger Point Dry Needling  Initial Treatment: Pt instructed on Dry Needling rational, procedures, and possible  side effects. Pt instructed to expect mild to moderate muscle soreness later in the day and/or into the next day.  Pt instructed in methods to reduce muscle soreness. Pt instructed to continue prescribed HEP. Patient was educated on signs and symptoms of infection and other risk factors and advised to seek medical attention should they occur.  Patient verbalized understanding of these instructions and education.   Patient Verbal Consent Given: Yes Education Handout Provided: Yes Muscles Treated: Bil L5 multifidi and bil glutes proximal to SIJs Electrical Stimulation Performed: No Treatment Response/Outcome: twitch response/release  Abdominal scar tissue mobilization  Soft tissue mobilization to lumbar paraspinals, obliques, QL, and glutes bil Exercises: Prone hip flexor stretches with overpressure 6 minutes Therapeutic activities: Functional strengthening activities with deep core coordination: Squats Reverse lunges Dead lifts Unilateral bent rows *doing with a band *discussed performing one exercise at a time to help work it in throughout the day   05/29/23 Therapeutic Activities: Urge drill for fecal incontinence Review of core strengthening program Manual: Abdominal scar tissue mobilization  Soft tissue mobilization to lumbar paraspinals, obliques, QL, and glutes bil Exercises: Prone hip flexor stretches with overpressure Supine piriformis stretches with overpressure Supine hamstring stretches with overpressure    05/17/23 Manual: Trigger Point Dry Needling  Initial Treatment: Pt instructed on Dry Needling rational, procedures, and possible side effects. Pt instructed to expect mild to moderate muscle soreness later in the day and/or into the next day.  Pt instructed in methods to reduce muscle soreness. Pt instructed to continue prescribed HEP. Patient was educated on signs and symptoms of infection and other risk factors and advised to seek medical attention should  they occur.  Patient verbalized understanding of these instructions and education.   Patient Verbal Consent Given: Yes Education Handout Provided: Yes Muscles Treated: Bil L3-S2 multifidi and bil glutes proximal to SIJs Electrical Stimulation Performed: No Treatment Response/Outcome: twitch response/release  Soft tissue mobilization to lumbar paraspinals and bil glutes  Exercises: Prone hip flexor stretches with overpressure Supine piriformis stretches with overpressure Supine hamstring stretches with overpressure    PATIENT EDUCATION:  Education details: See above Person educated: Patient Education method: Explanation, Demonstration, Tactile cues, Verbal cues, and Handouts Education comprehension: verbalized understanding  HOME EXERCISE PROGRAM: [id number]  ASSESSMENT:  CLINICAL IMPRESSION: Pt is doing better with improved energy levels and better sleep. However, he is still having cycle of duarrhea and constipation that are bothersome and is going to discuss with MD. Due to episode of fecal incontinence when he was squatting, we discussed how this sounds like issues with pressure management and external anal sphincter strength. We reviewed core activation and discussed importance of focusing on some strengthening activities. As he has more energy. He went of functional exercises and how to breathe to help improve his function. Good tolerance to manual techniques, most notable restriction/trigger points surrounding L5/Rt SIJ and upper Rt quadrant of abdomen. Tolerated manual techniques to these areas very well. He will continue to benefit from skilled PT intervention in order to decrease bowel dysfunction, improve abdominal pressure management, and begin/progress functional strengthening program.   OBJECTIVE IMPAIRMENTS: decreased activity tolerance, decreased coordination, decreased endurance, decreased mobility, decreased strength, increased fascial restrictions, increased muscle  spasms, impaired tone, postural dysfunction, and pain.   ACTIVITY LIMITATIONS: continence  PARTICIPATION LIMITATIONS: community activity, occupation, and exercise  PERSONAL FACTORS: 1 comorbidity: medical history  are also  affecting patient's functional outcome.   REHAB POTENTIAL: Good  CLINICAL DECISION MAKING: Stable/uncomplicated  EVALUATION COMPLEXITY: Low   GOALS: Goals reviewed with patient? Yes  SHORT TERM GOALS: Target date: 04/12/2023 - updated 04/12/23    Pt will be independent with HEP.   Baseline: Goal status: MET 04/12/23  2.  Pt will be independent with use of squatty potty, relaxed toileting mechanics, and improved bowel movement techniques in order to increase ease of bowel movements and complete evacuation.   Baseline:  Goal status: MET 04/12/23  3.  Pt will perform daily self bowel mobilization in order to improve frequency and complete evacuation.  Baseline:  Goal status: Met 04/12/23    LONG TERM GOALS: Target date: 08/30/23 - updated 04/12/23 - updated 05/18/23  Pt will be independent with advanced HEP.   Baseline:  Goal status: IN PROGRESS 05/18/23  2.  Pt will demonstrate improved mobility in abdominal scar tissue. Baseline:  Goal status: IN PROGRESS 05/18/23  3.  Pt will be able to perform curl-up test in supine without abdominal distortion. Baseline:  Goal status: IN PROGRESS 05/18/23  4.  Pt will be able to perform single leg stance bil without any pelvic drop in order to demonstrate improved functional core strength.  Baseline:  Goal status: IN PROGRESS 05/18/23  5.  Pt will report 75% improvement in fecal urgency. Baseline:  Goal status: IN PROGRESS 05/18/23  6.  Pt will report no episodes of fecal incontinence.  Baseline:  Goal status: IN PROGRESS 05/18/23   PLAN:  PT FREQUENCY: 1-2x/week  PT DURATION: 6 months  PLANNED INTERVENTIONS: 97110-Therapeutic exercises, 97530- Therapeutic activity, 97112- Neuromuscular re-education, 97535- Self  Care, 14782- Manual therapy, Dry Needling, and Biofeedback  PLAN FOR NEXT SESSION: Continue manual techniques to pelvic scar tissue and low back; progress core training; progress mobility activities.    Verlena Glenn, PT, DPT04/30/259:30 AM

## 2023-06-20 ENCOUNTER — Ambulatory Visit: Admitting: Internal Medicine

## 2023-06-22 ENCOUNTER — Encounter

## 2023-07-05 ENCOUNTER — Ambulatory Visit (INDEPENDENT_AMBULATORY_CARE_PROVIDER_SITE_OTHER): Admitting: Internal Medicine

## 2023-07-05 ENCOUNTER — Encounter: Payer: Self-pay | Admitting: Internal Medicine

## 2023-07-05 VITALS — BP 124/80 | HR 73 | Temp 98.0°F | Ht 69.0 in | Wt 176.0 lb

## 2023-07-05 DIAGNOSIS — C20 Malignant neoplasm of rectum: Secondary | ICD-10-CM

## 2023-07-05 DIAGNOSIS — F5101 Primary insomnia: Secondary | ICD-10-CM | POA: Diagnosis not present

## 2023-07-05 MED ORDER — DIPHENOXYLATE-ATROPINE 2.5-0.025 MG PO TABS: 1.0000 | ORAL_TABLET | Freq: Every day | ORAL | 0 refills | Status: DC | PRN

## 2023-07-05 MED ORDER — OXYBUTYNIN CHLORIDE 5 MG PO TABS: 5.0000 mg | ORAL_TABLET | Freq: Every day | ORAL | 1 refills | Status: DC

## 2023-07-05 NOTE — Assessment & Plan Note (Signed)
 Having issues with BM and rx lomotil to see if this helps. We discussed it will likely constipate since zofran and imodium have both constipated.

## 2023-07-05 NOTE — Progress Notes (Signed)
   Subjective:   Patient ID: Edward [name], male    DOB: [birth year], 48 y.o.   MRN: [id number]  GI Problem The primary symptoms include diarrhea. Primary symptoms do not include abdominal pain, nausea or vomiting.  The illness is also significant for constipation.   The patient is a 48 YO man coming in for digestive issues s/p surgery for rectal cancer with takedown of iliostomy 2024. He has struggled with these issues since that time. Has seen the surgeon back and they tried ondansetron and imodium which has helped some but just constipates and then he has a day of unpredictability and still having accidents. He also does not have enough tone to push out BM and has to assist at times. Has done pelvic floor PT and this is not helping as much as he would wish. Waking up at night multiple times to go to bathroom (urinate).   Review of Systems  Constitutional: Negative.   HENT: Negative.    Eyes: Negative.   Respiratory:  Negative for cough, chest tightness and shortness of breath.   Cardiovascular:  Negative for chest pain, palpitations and leg swelling.  Gastrointestinal:  Positive for constipation and diarrhea. Negative for abdominal distention, abdominal pain, nausea and vomiting.  Musculoskeletal: Negative.   Skin: Negative.   Neurological: Negative.   Psychiatric/Behavioral: Negative.      Objective:  Physical Exam Constitutional:      Appearance: He is well-developed.  HENT:     Head: Normocephalic and atraumatic.  Cardiovascular:     Rate and Rhythm: Normal rate and regular rhythm.  Pulmonary:     Effort: Pulmonary effort is normal. No respiratory distress.     Breath sounds: Normal breath sounds. No wheezing or rales.  Abdominal:     General: Bowel sounds are normal. There is no distension.     Palpations: Abdomen is soft.     Tenderness: There is no abdominal tenderness. There is no rebound.  Musculoskeletal:     Cervical back: Normal range of motion.  Skin:    General:  Skin is warm and dry.  Neurological:     Mental Status: He is alert and oriented to person, place, and time.     Coordination: Coordination normal.     Vitals:   07/05/23 0907  BP: 124/80  Pulse: 73  Temp: 98 F (36.7 C)  TempSrc: Oral  SpO2: 98%  Weight: 176 lb (79.8 kg)  Height: 5\' 9"  (1.753 m)    Assessment & Plan:  Visit time 15 minutes in face to face communication with patient and coordination of care, additional 15 minutes spent in record review, coordination or care, ordering tests, communicating/referring to other healthcare professionals, documenting in medical records all on the same day of the visit for total time 30 minutes spent on the visit.

## 2023-07-05 NOTE — Assessment & Plan Note (Signed)
 He does wake up often to urinate at night time since surgery. Rx oxybutynin to try to see if this can help with sleep (does cause drowsiness) and with his GI issues (can cause constipation).

## 2023-07-05 NOTE — Patient Instructions (Addendum)
 Remeron is an option for sleep.   Rozerem is the prescription version of melatonin which can help for sleep.  We can try the bladder medicine oxybutynin at night time to see if this helps with sleeping through the night.  We have sent in the lomotil to use for diarrhea daily as needed.   The other IBS medicine we could try if this does not work is viberzi.

## 2023-08-15 ENCOUNTER — Ambulatory Visit: Attending: Internal Medicine

## 2023-08-15 DIAGNOSIS — M62838 Other muscle spasm: Secondary | ICD-10-CM | POA: Diagnosis present

## 2023-08-15 DIAGNOSIS — M6281 Muscle weakness (generalized): Secondary | ICD-10-CM | POA: Diagnosis present

## 2023-08-15 DIAGNOSIS — R293 Abnormal posture: Secondary | ICD-10-CM | POA: Diagnosis present

## 2023-08-15 DIAGNOSIS — R279 Unspecified lack of coordination: Secondary | ICD-10-CM | POA: Diagnosis present

## 2023-08-15 DIAGNOSIS — M5459 Other low back pain: Secondary | ICD-10-CM | POA: Insufficient documentation

## 2023-08-15 NOTE — Therapy (Signed)
 OUTPATIENT PHYSICAL THERAPY MALE PELVIC TREATMENT   Patient Name: Edward [name] MRN: [id number] DOB:1975-06-19, 48 y.o., male Today's Date: 08/15/2023  END OF SESSION:  PT End of Session - 08/15/23 0804     Visit Number 9    Date for PT Re-Evaluation 08/30/23    Authorization Type [employer]    PT Start Time 0802    PT Stop Time 0842    PT Time Calculation (min) 40 min    Activity Tolerance Patient tolerated treatment well    Behavior During Therapy WFL for tasks assessed/performed           Past Medical History:  Diagnosis Date   Arthritis    OA right knee   Family history of prostate cancer    Kidney stones    has a horse shoe kidney   Rectal cancer (HCC)    Thyroid  disease    History reviewed. No pertinent surgical history. Patient Active Problem List   Diagnosis Date Noted   Snoring 01/20/2023   Degenerative disc disease, cervical 01/02/2023   Chronic SI joint pain 01/02/2023   Midline back pain 06/08/2022   Right carpal tunnel syndrome 04/25/2022   Adjustment disorder 11/04/2021   Skin mole 08/11/2021   Extensor carpi ulnaris tendinitis 06/01/2021   Routine general medical examination at a health care facility 01/05/2021   Genetic testing 12/28/2020   [facility] [address] joint pain 11/24/2020   Rectal cancer (HCC) 09/22/2020   Insomnia 09/22/2020   Hypothyroid 09/22/2020   Nonallopathic lesion of thoracic region 11/25/2018   Nonallopathic lesion of sacral region 11/25/2018   Nonallopathic lesion of lumbosacral region 11/25/2018   Nonallopathic lesion of rib cage 11/25/2018   Nonallopathic lesion of cervical region 11/25/2018   Trigger point of left shoulder region 08/29/2018   Patellofemoral arthralgia of right knee 07/25/2018   Scapular dyskinesis 07/25/2018    PCP: Rollene Almarie LABOR, MD  REFERRING PROVIDER: Rollene Almarie LABOR, MD  REFERRING DIAG: C20 (ICD-10-CM) - Rectal cancer [facility])  THERAPY DIAG:  Muscle weakness (generalized)  Other muscle  spasm  Unspecified lack of coordination  Abnormal posture  Other low back pain  Rationale for Evaluation and Treatment: Rehabilitation  ONSET DATE: 2022  SUBJECTIVE:                                                                                                                                                                                           SUBJECTIVE STATEMENT: Pt states that he went and played soccer for the first time since treatment. He has also been running several times. In general he has not been keeping up with exercises as well. He  is meeting with dietician tomorrow. He is still on schedule of being constipated for several days and then having many bowel movements with loose stool and some incontinence. He is taking fiber several times daily and trying to stop other medications to see how he does. He states that he is overall 49% better from his first visit.   PAIN: 08/15/23 Are you having pain? Yes NPRS scale: 0/10 Pain location: low back  Pain type: aching and tight Pain description: constant   Aggravating factors: straining, sleeping positions, prolonged standing - coming up from bending Relieving factors: rest, avoiding straining  PRECAUTIONS: Other: rectal cancer 79777 diagnosis with surgery x2 and chemo  RED FLAGS: None   WEIGHT BEARING RESTRICTIONS: No  FALLS:  Has patient fallen in last 6 months? 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, mild disc/facet degeneration in mid-lower lumbar spine  BOWEL MOVEMENT: Pain with bowel movement: No Type of bowel movement:Type (Bristol Stool Scale) 1-7, Frequency random, and Strain Yes Fully empty rectum: No Leakage: Yes: improved with immodium and  ondanestron - 3 or 4 accidents since reversal  Pads: No Fiber supplement: Yes: tried metamucil in the past  URINATION: Pain with urination: No Fully empty bladder: Yes: - Stream: Strong Urgency: No Frequency: 1-2x/night; few times a day  Leakage: none Pads: No  INTERCOURSE: Pain with intercourse: none Climax: WNL Ejaculation: Yes: -   OBJECTIVE:  Note: Objective measures were completed at Evaluation unless otherwise noted. 03/22/23:             External Perineal Exam deferred to next session              Internal Pelvic Floor Significant restriction and scar tissue with tenderness  Patient confirms identification and approves PT to assess internal pelvic floor and treatment Yes   PELVIC MMT:    MMT eval  Internal Anal Sphincter 1/5  External Anal Sphincter 2/5 with multimodal cues to help isolate contraction  Puborectalis 1/5  Diastasis Recti 3 finger width diastasis with distortion  (Blank rows = not tested)  TONE: deferred to next session 03/15/23:  COGNITION: Overall cognitive status: Within functional limits for tasks assessed     SENSATION: Light touch: Appears intact Proprioception: Appears intact   FUNCTIONAL TESTS:  Squat: WNL, some Rt knee pain Single leg stance:  Rt: Lt pelvic drop  Lt:Rt pelvic drop Curl-up test: 3 finger width diastasis with distortion inferior/superior to umbilicus Sit-up test: 2/3  GAIT: Comments: WNL  POSTURE: rounded shoulders, forward head, decreased lumbar lordosis, decreased thoracic kyphosis, and posterior pelvic tilt  PELVIC ALIGNMENT: Rt posterior rotation  LUMBARAROM/PROM:  A/PROM A/PROM  Eval (% available)  Flexion 75  Extension 50  Right lateral flexion 50  Left lateral flexion 50  Right rotation 50  Left rotation 75   (Blank rows = not tested)   PALPATION: GENERAL significant scar tissue restriction in abdomen, Rt>Lt; tenderness in lower abdomen; rib cage restriction, lumbar paraspinal tone, Rt>Lt;  tenderness at midline throughout lumbar and lower thoracic spine; decreased lumbar spine mobility with PA mobilizations     TODAY'S TREATMENT:  DATE:  08/15/23 Manual: Pt provides verbal consent for internal rectal pelvic floor exam. Pelvic floor muscle reassessment rectally Internal rectal pelvic floor muscle release and external anal sphincter release Neuromuscular re-education: Transversus abdominus training with multimodal cues for improved motor control and breath coordination  Therapeutic activities: Return to down training program to help with gripping  Review of urge drill and why it is more beneficial than clenching   06/14/23 Manual: Trigger Point Dry Needling  Initial Treatment: Pt instructed on Dry Needling rational, procedures, and possible side effects. Pt instructed to expect mild to moderate muscle soreness later in the day and/or into the next day.  Pt instructed in methods to reduce muscle soreness. Pt instructed to continue prescribed HEP. Patient was educated on signs and symptoms of infection and other risk factors and advised to seek medical attention should they occur.  Patient verbalized understanding of these instructions and education.   Patient Verbal Consent Given: Yes Education Handout Provided: Yes Muscles Treated: Bil L5 multifidi and bil glutes proximal to SIJs Electrical Stimulation Performed: No Treatment Response/Outcome: twitch response/release  Abdominal scar tissue mobilization  Soft tissue mobilization to lumbar paraspinals, obliques, QL, and glutes bil Exercises: Prone hip flexor stretches with overpressure 6 minutes Therapeutic activities: Functional strengthening activities with deep core coordination: Squats Reverse lunges Dead lifts Unilateral bent rows *doing with a band *discussed performing one exercise at  a time to help work it in throughout the day   05/29/23 Therapeutic Activities: Urge drill for fecal incontinence Review of core strengthening program Manual: Abdominal scar tissue mobilization  Soft tissue mobilization to lumbar paraspinals, obliques, QL, and glutes bil Exercises: Prone hip flexor stretches with overpressure Supine piriformis stretches with overpressure Supine hamstring stretches with overpressure  PATIENT EDUCATION:  Education details: See above Person educated: Patient Education method: Programmer, multimedia, Demonstration, Tactile cues, Verbal cues, and Handouts Education comprehension: verbalized understanding  HOME EXERCISE PROGRAM: [id number]  ASSESSMENT:  CLINICAL IMPRESSION: Pt doing very well overall and has been able to return to soccer and running without issues. He continues to have some difficulty with fecal incontinence, especially after day 3 of his constipation cycle when he starts to clear out; he has had two episodes of fecal incontinence in the last week. He is working on increasing fiber and stopping medication that has been leading to more constipation to see how he feels. Internal rectal exam demonstrated tightness and clenching. His strength was good at 4/5, but he lacks endurance, likely associated with muscular fatigue from chronic clenching. No tenderness with palpation. Abdominal scar tissue restriction is improving well, but he also has gripping in obliques. We worked on transversus abdominus contraction with exhales to help decrease tension and bracing in abdominals.  He will continue to benefit from skilled PT intervention in order to decrease bowel dysfunction, improve abdominal pressure management, and begin/progress functional strengthening program.   OBJECTIVE IMPAIRMENTS: decreased activity tolerance, decreased coordination, decreased endurance, decreased mobility, decreased strength, increased fascial restrictions, increased muscle spasms, impaired  tone, postural dysfunction, and pain.   ACTIVITY LIMITATIONS: continence  PARTICIPATION LIMITATIONS: community activity, occupation, and exercise  PERSONAL FACTORS: 1 comorbidity: medical history are also affecting patient's functional outcome.   REHAB POTENTIAL: Good  CLINICAL DECISION MAKING: Stable/uncomplicated  EVALUATION COMPLEXITY: Low   GOALS: Goals reviewed with patient? Yes  SHORT TERM GOALS: Target date: 04/12/2023 - updated 04/12/23    Pt will be independent with HEP.   Baseline: Goal status: MET 04/12/23  2.  Pt will be independent with  use of squatty potty, relaxed toileting mechanics, and improved bowel movement techniques in order to increase ease of bowel movements and complete evacuation.   Baseline:  Goal status: MET 04/12/23  3.  Pt will perform daily self bowel mobilization in order to improve frequency and complete evacuation.  Baseline:  Goal status: Met 04/12/23    LONG TERM GOALS: Updated 08/14/23  Pt will be independent with advanced HEP.   Baseline:  Goal status: IN PROGRESS 08/14/23  2.  Pt will demonstrate improved mobility in abdominal scar tissue. Baseline:  Goal status: IN PROGRESS 08/14/23  3.  Pt will be able to perform curl-up test in supine without abdominal distortion. Baseline: still has abdominal distortion, but with cuing for better transversus abdominus contraction he was able to improve Goal status: IN PROGRESS 08/14/23  4.  Pt will be able to perform single leg stance bil without any pelvic drop in order to demonstrate improved functional core strength.  Baseline:  Goal status: MET 08/14/23  5.  Pt will report 75% improvement in fecal urgency. Baseline: he's currently at 40% improvement Goal status: IN PROGRESS 08/14/23  6.  Pt will report no episodes of fecal incontinence.  Baseline: in the last week, he has had 2 episodes of fecal incontinence Goal status: IN PROGRESS 08/14/23   PLAN:  PT FREQUENCY: 1-2x/week  PT  DURATION: 6 months  PLANNED INTERVENTIONS: 97110-Therapeutic exercises, 97530- Therapeutic activity, 97112- Neuromuscular re-education, 97535- Self Care, [id number]- Manual therapy, Dry Needling, and Biofeedback  PLAN FOR NEXT SESSION: Continue manual techniques to pelvic scar tissue and low back; progress core training; progress mobility activities.    Josette Mares, PT, DPT07/01/258:06 AM

## 2023-08-21 ENCOUNTER — Ambulatory Visit: Payer: Self-pay

## 2023-08-21 DIAGNOSIS — M6281 Muscle weakness (generalized): Secondary | ICD-10-CM | POA: Diagnosis not present

## 2023-08-21 DIAGNOSIS — M62838 Other muscle spasm: Secondary | ICD-10-CM

## 2023-08-21 DIAGNOSIS — R293 Abnormal posture: Secondary | ICD-10-CM

## 2023-08-21 DIAGNOSIS — M5459 Other low back pain: Secondary | ICD-10-CM

## 2023-08-21 DIAGNOSIS — R279 Unspecified lack of coordination: Secondary | ICD-10-CM

## 2023-08-21 NOTE — Therapy (Signed)
 OUTPATIENT PHYSICAL THERAPY MALE PELVIC TREATMENT   Patient Name: Edward [name] MRN: [id number] DOB:10-May-1975, 48 y.o., male Today's Date: 08/21/2023  END OF SESSION:  PT End of Session - 08/21/23 1231     Visit Number 10    Date for PT Re-Evaluation 01/30/24    Authorization Type [employer]et[employer]    PT Start Time 1230    PT Stop Time 1314    PT Time Calculation (min) 44 min    Activity Tolerance Patient tolerated treatment well    Behavior During Therapy WFL for tasks assessed/performed           Past Medical History:  Diagnosis Date   Arthritis    OA right knee   Family history of prostate cancer    Kidney stones    has a horse shoe kidney   Rectal cancer (HCC)    Thyroid  disease    History reviewed. No pertinent surgical history. Patient Active Problem List   Diagnosis Date Noted   Snoring 01/20/2023   Degenerative disc disease, cervical 01/02/2023   Chronic SI joint pain 01/02/2023   Midline back pain 06/08/2022   Right carpal tunnel syndrome 04/25/2022   Adjustment disorder 11/04/2021   Skin mole 08/11/2021   Extensor carpi ulnaris tendinitis 06/01/2021   Routine general medical examination at a health care facility 01/05/2021   Genetic testing 12/28/2020   [facility] joint pain 11/24/2020   Rectal cancer (HCC) 09/22/2020   Insomnia 09/22/2020   Hypothyroid 09/22/2020   Nonallopathic lesion of thoracic region 11/25/2018   Nonallopathic lesion of sacral region 11/25/2018   Nonallopathic lesion of lumbosacral region 11/25/2018   Nonallopathic lesion of rib cage 11/25/2018   Nonallopathic lesion of cervical region 11/25/2018   Trigger point of left shoulder region 08/29/2018   Patellofemoral arthralgia of right knee 07/25/2018   Scapular dyskinesis 07/25/2018    PCP: Rollene Almarie LABOR, MD  REFERRING PROVIDER: Rollene Almarie LABOR, MD  REFERRING DIAG: C20 (ICD-10-CM) - Rectal cancer [facility])  THERAPY DIAG:  Muscle weakness (generalized)  Other muscle  spasm  Unspecified lack of coordination  Abnormal posture  Other low back pain  Rationale for Evaluation and Treatment: Rehabilitation  ONSET DATE: 2022  SUBJECTIVE:                                                                                                                                                                                           SUBJECTIVE STATEMENT: Pt states that he had accident out of nowhere at a cookout. He states that a significant amount of stool came out. He states that he feels like he is going to have gas  and then just leaks. He cannot differentiate between   PAIN: 08/21/23 Are you having pain? Yes NPRS scale: 0/10 Pain location: low back  Pain type: aching and tight Pain description: constant   Aggravating factors: straining, sleeping positions, prolonged standing - coming up from bending Relieving factors: rest, avoiding straining  PRECAUTIONS: Other: rectal cancer 79777 diagnosis with surgery x2 and chemo  RED FLAGS: None   WEIGHT BEARING RESTRICTIONS: No  FALLS:  Has patient fallen in last 6 months? 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, mild disc/facet degeneration in mid-lower lumbar spine  BOWEL MOVEMENT: Pain with bowel movement: No Type of bowel movement:Type (Bristol Stool Scale) 1-7, Frequency random, and Strain Yes Fully empty rectum: No Leakage: Yes: improved with immodium and ondanestron - 3 or 4 accidents since reversal  Pads: No Fiber supplement: Yes: tried metamucil in the past  URINATION: Pain with urination: No Fully empty bladder: Yes: - Stream: Strong Urgency: No Frequency: 1-2x/night; few times a day  Leakage: none Pads: No  INTERCOURSE: Pain with  intercourse: none Climax: WNL Ejaculation: Yes: -   OBJECTIVE:  Note: Objective measures were completed at Evaluation unless otherwise noted. 03/22/23:             External Perineal Exam deferred to next session              Internal Pelvic Floor Significant restriction and scar tissue with tenderness  Patient confirms identification and approves PT to assess internal pelvic floor and treatment Yes   PELVIC MMT:    MMT eval  Internal Anal Sphincter 1/5  External Anal Sphincter 2/5 with multimodal cues to help isolate contraction  Puborectalis 1/5  Diastasis Recti 3 finger width diastasis with distortion  (Blank rows = not tested)  TONE: deferred to next session 03/15/23:  COGNITION: Overall cognitive status: Within functional limits for tasks assessed     SENSATION: Light touch: Appears intact Proprioception: Appears intact   FUNCTIONAL TESTS:  Squat: WNL, some Rt knee pain Single leg stance:  Rt: Lt pelvic drop  Lt:Rt pelvic drop Curl-up test: 3 finger width diastasis with distortion inferior/superior to umbilicus Sit-up test: 2/3  GAIT: Comments: WNL  POSTURE: rounded shoulders, forward head, decreased lumbar lordosis, decreased thoracic kyphosis, and posterior pelvic tilt  PELVIC ALIGNMENT: Rt posterior rotation  LUMBARAROM/PROM:  A/PROM A/PROM  Eval (% available)  Flexion 75  Extension 50  Right lateral flexion 50  Left lateral flexion 50  Right rotation 50  Left rotation 75   (Blank rows = not tested)   PALPATION: GENERAL significant scar tissue restriction in abdomen, Rt>Lt; tenderness in lower abdomen; rib cage restriction, lumbar paraspinal tone, Rt>Lt; tenderness at midline throughout lumbar and lower thoracic spine; decreased lumbar spine mobility with PA mobilizations     TODAY'S TREATMENT:  DATE:   08/21/23 Manual: Manual scar tissue mobilization to abdomen Negative pressure soft tissue mobilization to abdominal scar tissue Abdominal skin rolling Neuromuscular re-education: Rehabilitative ultrasound imaging Biofeedback of transversus abdominus and pelvic floor muscle contractions while visualizing abdominal pressure Correction of abdominal pressure with visual feedback of screen and tactile feedback on transversus abdominus  Therapeutic activities: Being consistent with pelvic floor muscle strengthening program in sitting and standing positions Abdominal pressure management and importance in incontinence TENs at perineum to help cue contractions    08/15/23 Manual: Pt provides verbal consent for internal rectal pelvic floor exam. Pelvic floor muscle reassessment rectally Internal rectal pelvic floor muscle release and external anal sphincter release Neuromuscular re-education: Transversus abdominus training with multimodal cues for improved motor control and breath coordination  Therapeutic activities: Return to down training program to help with gripping  Review of urge drill and why it is more beneficial than clenching   06/14/23 Manual: Trigger Point Dry Needling  Initial Treatment: Pt instructed on Dry Needling rational, procedures, and possible side effects. Pt instructed to expect mild to moderate muscle soreness later in the day and/or into the next day.  Pt instructed in methods to reduce muscle soreness. Pt instructed to continue prescribed HEP. Patient was educated on signs and symptoms of infection and other risk factors and advised to seek medical attention should they occur.  Patient verbalized understanding of these instructions and education.   Patient Verbal Consent Given: Yes Education Handout Provided: Yes Muscles Treated: Bil L5 multifidi and bil glutes proximal to SIJs Electrical Stimulation Performed: No Treatment Response/Outcome: twitch  response/release  Abdominal scar tissue mobilization  Soft tissue mobilization to lumbar paraspinals, obliques, QL, and glutes bil Exercises: Prone hip flexor stretches with overpressure 6 minutes Therapeutic activities: Functional strengthening activities with deep core coordination: Squats Reverse lunges Dead lifts Unilateral bent rows *doing with a band *discussed performing one exercise at a time to help work it in throughout the day PATIENT EDUCATION:  Education details: See above Person educated: Patient Education method: Programmer, multimedia, Facilities manager, Actor cues, Verbal cues, and Handouts Education comprehension: verbalized understanding  HOME EXERCISE PROGRAM: [id number]  ASSESSMENT:  CLINICAL IMPRESSION: Pt had significant episode of fecal incontinence several days ago without warning and no control once it started. Due to this, we utilized rehabilitative ultrasound imaging to look at how his pelvic floor muscles are moving and abdominal pressure when he attempts contractions. He is demosntrating good pelvic floor muscle movement, but then bears down afterwards which could be contributing to incontinence. He was able to correct with use of rehabilitative ultrasound imaging and tactile cues. He was also encouraged to be more compliant with pelvic floor muscle strengthening program in sitting and standing positions. We continued to work on significant scar tissue restriction in abdomen. He will continue to benefit from skilled PT intervention in order to decrease bowel dysfunction, improve abdominal pressure management, and begin/progress functional strengthening program.   OBJECTIVE IMPAIRMENTS: decreased activity tolerance, decreased coordination, decreased endurance, decreased mobility, decreased strength, increased fascial restrictions, increased muscle spasms, impaired tone, postural dysfunction, and pain.   ACTIVITY LIMITATIONS: continence  PARTICIPATION LIMITATIONS:  community activity, occupation, and exercise  PERSONAL FACTORS: 1 comorbidity: medical history are also affecting patient's functional outcome.   REHAB POTENTIAL: Good  CLINICAL DECISION MAKING: Stable/uncomplicated  EVALUATION COMPLEXITY: Low   GOALS: Goals reviewed with patient? Yes  SHORT TERM GOALS: Target date: 04/12/2023 - updated 04/12/23    Pt will be independent with HEP.   Baseline: Goal status:  MET 04/12/23  2.  Pt will be independent with use of squatty potty, relaxed toileting mechanics, and improved bowel movement techniques in order to increase ease of bowel movements and complete evacuation.   Baseline:  Goal status: MET 04/12/23  3.  Pt will perform daily self bowel mobilization in order to improve frequency and complete evacuation.  Baseline:  Goal status: Met 04/12/23    LONG TERM GOALS: Updated 08/14/23  Pt will be independent with advanced HEP.   Baseline:  Goal status: IN PROGRESS 08/14/23  2.  Pt will demonstrate improved mobility in abdominal scar tissue. Baseline:  Goal status: IN PROGRESS 08/14/23  3.  Pt will be able to perform curl-up test in supine without abdominal distortion. Baseline: still has abdominal distortion, but with cuing for better transversus abdominus contraction he was able to improve Goal status: IN PROGRESS 08/14/23  4.  Pt will be able to perform single leg stance bil without any pelvic drop in order to demonstrate improved functional core strength.  Baseline:  Goal status: MET 08/14/23  5.  Pt will report 75% improvement in fecal urgency. Baseline: he's currently at 40% improvement Goal status: IN PROGRESS 08/14/23  6.  Pt will report no episodes of fecal incontinence.  Baseline: in the last week, he has had 2 episodes of fecal incontinence Goal status: IN PROGRESS 08/14/23   PLAN:  PT FREQUENCY: 1-2x/week  PT DURATION: 6 months  PLANNED INTERVENTIONS: 97110-Therapeutic exercises, 97530- Therapeutic activity,  97112- Neuromuscular re-education, 97535- Self Care, 02859- Manual therapy, Dry Needling, and Biofeedback  PLAN FOR NEXT SESSION: Continue manual techniques to pelvic scar tissue and low back; progress core training; progress mobility activities.    Josette Mares, PT, DPT07/07/251:26 PM

## 2023-08-28 ENCOUNTER — Encounter

## 2023-08-30 ENCOUNTER — Ambulatory Visit: Payer: Self-pay

## 2023-08-30 DIAGNOSIS — R279 Unspecified lack of coordination: Secondary | ICD-10-CM

## 2023-08-30 DIAGNOSIS — M5459 Other low back pain: Secondary | ICD-10-CM

## 2023-08-30 DIAGNOSIS — M62838 Other muscle spasm: Secondary | ICD-10-CM

## 2023-08-30 DIAGNOSIS — R293 Abnormal posture: Secondary | ICD-10-CM

## 2023-08-30 DIAGNOSIS — M6281 Muscle weakness (generalized): Secondary | ICD-10-CM | POA: Diagnosis not present

## 2023-08-30 NOTE — Therapy (Signed)
 OUTPATIENT PHYSICAL THERAPY MALE PELVIC TREATMENT   Patient Name: Edward [name] MRN: [id number] DOB:21-Feb-1975, 48 y.o., male Today's Date: 08/30/2023  END OF SESSION:  PT End of Session - 08/30/23 0845     Visit Number 11    Date for PT Re-Evaluation 01/30/24    Authorization Type [employer]    PT Start Time 0800    PT Stop Time 0844    PT Time Calculation (min) 44 min            Past Medical History:  Diagnosis Date   Arthritis    OA right knee   Family history of prostate cancer    Kidney stones    has a horse shoe kidney   Rectal cancer (HCC)    Thyroid  disease    History reviewed. No pertinent surgical history. Patient Active Problem List   Diagnosis Date Noted   Snoring 01/20/2023   Degenerative disc disease, cervical 01/02/2023   Chronic SI joint pain 01/02/2023   Midline back pain 06/08/2022   Right carpal tunnel syndrome 04/25/2022   Adjustment disorder 11/04/2021   Skin mole 08/11/2021   Extensor carpi ulnaris tendinitis 06/01/2021   Routine general medical examination at a health care facility 01/05/2021   Genetic testing 12/28/2020   [facility] joint pain 11/24/2020   Rectal cancer (HCC) 09/22/2020   Insomnia 09/22/2020   Hypothyroid 09/22/2020   Nonallopathic lesion of thoracic region 11/25/2018   Nonallopathic lesion of sacral region 11/25/2018   Nonallopathic lesion of lumbosacral region 11/25/2018   Nonallopathic lesion of rib cage 11/25/2018   Nonallopathic lesion of cervical region 11/25/2018   Trigger point of left shoulder region 08/29/2018   Patellofemoral arthralgia of right knee 07/25/2018   Scapular dyskinesis 07/25/2018    PCP: Rollene Almarie LABOR, MD  REFERRING PROVIDER: Rollene Almarie LABOR, MD  REFERRING DIAG: C20 (ICD-10-CM) - Rectal cancer [facility])  THERAPY DIAG:  Muscle weakness (generalized)  Other muscle spasm  Unspecified lack of coordination  Abnormal posture  Other low back pain  Rationale for Evaluation and  Treatment: Rehabilitation  ONSET DATE: 2022  SUBJECTIVE:                                                                                                                                                                                           SUBJECTIVE STATEMENT: Pt states that he had another accident in the middle of the night. He is taking medication again. It causes a cycle of 3-4 days of constipation and then one day of being in the bathroom constantly. This is him taking Zofran once every 4 days on the day he has blow  outs. Without any Zofran, he reports bowel movements were unpredictable. He had attempted to drink more metamucil. He feels like he is having fewer accidents when walking to the bathroom.   PAIN: 08/21/23 Are you having pain? Yes NPRS scale: 0/10 Pain location: low back  Pain type: aching and tight Pain description: constant   Aggravating factors: straining, sleeping positions, prolonged standing - coming up from bending Relieving factors: rest, avoiding straining  PRECAUTIONS: Other: rectal cancer 79777 diagnosis with surgery x2 and chemo  RED FLAGS: None   WEIGHT BEARING RESTRICTIONS: No  FALLS:  Has patient fallen in last 6 months? 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, mild disc/facet degeneration in mid-lower lumbar spine  BOWEL MOVEMENT: Pain with bowel movement: No Type of bowel movement:Type (Bristol Stool Scale) 1-7, Frequency random, and Strain Yes Fully empty rectum: No Leakage: Yes: improved with immodium and ondanestron - 3 or 4 accidents since reversal  Pads: No Fiber supplement: Yes: tried metamucil in the past  URINATION: Pain with urination: No Fully empty bladder: Yes: - Stream:  Strong Urgency: No Frequency: 1-2x/night; few times a day  Leakage: none Pads: No  INTERCOURSE: Pain with intercourse: none Climax: WNL Ejaculation: Yes: -   OBJECTIVE:  Note: Objective measures were completed at Evaluation unless otherwise noted. 03/22/23:             External Perineal Exam deferred to next session              Internal Pelvic Floor Significant restriction and scar tissue with tenderness  Patient confirms identification and approves PT to assess internal pelvic floor and treatment Yes   PELVIC MMT:    MMT eval  Internal Anal Sphincter 1/5  External Anal Sphincter 2/5 with multimodal cues to help isolate contraction  Puborectalis 1/5  Diastasis Recti 3 finger width diastasis with distortion  (Blank rows = not tested)  TONE: deferred to next session 03/15/23:  COGNITION: Overall cognitive status: Within functional limits for tasks assessed     SENSATION: Light touch: Appears intact Proprioception: Appears intact   FUNCTIONAL TESTS:  Squat: WNL, some Rt knee pain Single leg stance:  Rt: Lt pelvic drop  Lt:Rt pelvic drop Curl-up test: 3 finger width diastasis with distortion inferior/superior to umbilicus Sit-up test: 2/3  GAIT: Comments: WNL  POSTURE: rounded shoulders, forward head, decreased lumbar lordosis, decreased thoracic kyphosis, and posterior pelvic tilt  PELVIC ALIGNMENT: Rt posterior rotation  LUMBARAROM/PROM:  A/PROM A/PROM  Eval (% available)  Flexion 75  Extension 50  Right lateral flexion 50  Left lateral flexion 50  Right rotation 50  Left rotation 75   (Blank rows = not tested)   PALPATION: GENERAL significant scar tissue restriction in abdomen, Rt>Lt; tenderness in lower abdomen; rib cage restriction, lumbar paraspinal tone, Rt>Lt; tenderness at midline throughout lumbar and lower thoracic spine; decreased lumbar spine mobility with PA mobilizations     TODAY'S TREATMENT:  DATE:  08/30/23 Manual: Manual scar tissue mobilization to abdomen Negative pressure soft tissue mobilization to abdominal scar tissue Abdominal skin rolling Ileocecal valve mobilization Rectal mobilization Therapeutic activities: Review diet and hydration to help with stool consistency and frequency Went of rectal anatomy and how it should be more of a sensory organ than a storage organ - why stool should not stay in rectum for long periods of time, especially with shortened rectum   08/21/23 Manual: Manual scar tissue mobilization to abdomen Negative pressure soft tissue mobilization to abdominal scar tissue Abdominal skin rolling Neuromuscular re-education: Rehabilitative ultrasound imaging Biofeedback of transversus abdominus and pelvic floor muscle contractions while visualizing abdominal pressure Correction of abdominal pressure with visual feedback of screen and tactile feedback on transversus abdominus  Therapeutic activities: Being consistent with pelvic floor muscle strengthening program in sitting and standing positions Abdominal pressure management and importance in incontinence TENs at perineum to help cue contractions    08/15/23 Manual: Pt provides verbal consent for internal rectal pelvic floor exam. Pelvic floor muscle reassessment rectally Internal rectal pelvic floor muscle release and external anal sphincter release Neuromuscular re-education: Transversus abdominus training with multimodal cues for improved motor control and breath coordination  Therapeutic activities: Return to down training program to help with gripping  Review of urge drill and why it is more beneficial than clenching day PATIENT EDUCATION:  Education details: See above Person educated: Patient Education method: Programmer, multimedia, Demonstration, Tactile cues, Verbal cues, and Handouts Education comprehension:  verbalized understanding  HOME EXERCISE PROGRAM: [id number]  ASSESSMENT:  CLINICAL IMPRESSION: Pt having more difficult time with fecal incontinence and being on a poor cycle with medication that causes 3-4 days of constipation and then a day of significant bowel movements. We discussed being consistent with whatever he is doing. We discussed anatomy of rectum and how it is meant to be a sensory organ, which makes it even more important to be consistent with full evacuation. He tolerated scar tissue mobilization techniques to abdomen well. He will continue to benefit from skilled PT intervention in order to decrease bowel dysfunction, improve abdominal pressure management, and begin/progress functional strengthening program.   OBJECTIVE IMPAIRMENTS: decreased activity tolerance, decreased coordination, decreased endurance, decreased mobility, decreased strength, increased fascial restrictions, increased muscle spasms, impaired tone, postural dysfunction, and pain.   ACTIVITY LIMITATIONS: continence  PARTICIPATION LIMITATIONS: community activity, occupation, and exercise  PERSONAL FACTORS: 1 comorbidity: medical history are also affecting patient's functional outcome.   REHAB POTENTIAL: Good  CLINICAL DECISION MAKING: Stable/uncomplicated  EVALUATION COMPLEXITY: Low   GOALS: Goals reviewed with patient? Yes  SHORT TERM GOALS: Target date: 04/12/2023 - updated 04/12/23    Pt will be independent with HEP.   Baseline: Goal status: MET 04/12/23  2.  Pt will be independent with use of squatty potty, relaxed toileting mechanics, and improved bowel movement techniques in order to increase ease of bowel movements and complete evacuation.   Baseline:  Goal status: MET 04/12/23  3.  Pt will perform daily self bowel mobilization in order to improve frequency and complete evacuation.  Baseline:  Goal status: Met 04/12/23    LONG TERM GOALS: Updated 08/14/23  Pt will be independent with  advanced HEP.   Baseline:  Goal status: IN PROGRESS 08/14/23  2.  Pt will demonstrate improved mobility in abdominal scar tissue. Baseline:  Goal status: IN PROGRESS 08/14/23  3.  Pt will be able to perform curl-up test in supine without abdominal distortion. Baseline: still has abdominal distortion, but with cuing  for better transversus abdominus contraction he was able to improve Goal status: IN PROGRESS 08/14/23  4.  Pt will be able to perform single leg stance bil without any pelvic drop in order to demonstrate improved functional core strength.  Baseline:  Goal status: MET 08/14/23  5.  Pt will report 75% improvement in fecal urgency. Baseline: he's currently at 40% improvement Goal status: IN PROGRESS 08/14/23  6.  Pt will report no episodes of fecal incontinence.  Baseline: in the last week, he has had 2 episodes of fecal incontinence Goal status: IN PROGRESS 08/14/23   PLAN:  PT FREQUENCY: 1-2x/week  PT DURATION: 6 months  PLANNED INTERVENTIONS: 97110-Therapeutic exercises, 97530- Therapeutic activity, 97112- Neuromuscular re-education, 97535- Self Care, [id number]- Manual therapy, Dry Needling, and Biofeedback  PLAN FOR NEXT SESSION: Continue manual techniques to pelvic scar tissue and low back; progress core training; progress mobility activities.    Josette Mares, PT, DPT07/16/258:47 AM

## 2023-09-04 ENCOUNTER — Ambulatory Visit: Payer: Self-pay

## 2023-09-04 DIAGNOSIS — M5459 Other low back pain: Secondary | ICD-10-CM

## 2023-09-04 DIAGNOSIS — M6281 Muscle weakness (generalized): Secondary | ICD-10-CM | POA: Diagnosis not present

## 2023-09-04 DIAGNOSIS — R293 Abnormal posture: Secondary | ICD-10-CM

## 2023-09-04 DIAGNOSIS — M62838 Other muscle spasm: Secondary | ICD-10-CM

## 2023-09-04 DIAGNOSIS — R279 Unspecified lack of coordination: Secondary | ICD-10-CM

## 2023-09-04 NOTE — Therapy (Signed)
 OUTPATIENT PHYSICAL THERAPY MALE PELVIC TREATMENT   Patient Name: Edward [name] MRN: [id number] DOB:[birth year], 48 y.o., male Today's Date: 09/04/2023  END OF SESSION:  PT End of Session - 09/04/23 0819     Visit Number 12    Date for PT Re-Evaluation 01/30/24    Authorization Type [employer]    PT Start Time 0815    PT Stop Time 0900    PT Time Calculation (min) 45 min    Activity Tolerance Patient tolerated treatment well    Behavior During Therapy WFL for tasks assessed/performed            Past Medical History:  Diagnosis Date   Arthritis    OA right knee   Family history of prostate cancer    Kidney stones    has a horse shoe kidney   Rectal cancer (HCC)    Thyroid  disease    History reviewed. No pertinent surgical history. Patient Active Problem List   Diagnosis Date Noted   Snoring 01/20/2023   Degenerative disc disease, cervical 01/02/2023   Chronic SI joint pain 01/02/2023   Midline back pain 06/08/2022   Right carpal tunnel syndrome 04/25/2022   Adjustment disorder 11/04/2021   Skin mole 08/11/2021   Extensor carpi ulnaris tendinitis 06/01/2021   Routine general medical examination at a health care facility 01/05/2021   Genetic testing 12/28/2020   [facility] joint pain 11/24/2020   Rectal cancer (HCC) 09/22/2020   Insomnia 09/22/2020   Hypothyroid 09/22/2020   Nonallopathic lesion of thoracic region 11/25/2018   Nonallopathic lesion of sacral region 11/25/2018   Nonallopathic lesion of lumbosacral region 11/25/2018   Nonallopathic lesion of rib cage 11/25/2018   Nonallopathic lesion of cervical region 11/25/2018   Trigger point of left shoulder region 08/29/2018   Patellofemoral arthralgia of right knee 07/25/2018   Scapular dyskinesis 07/25/2018    PCP: Rollene Almarie LABOR, MD  REFERRING PROVIDER: Rollene Almarie LABOR, MD  REFERRING DIAG: C20 (ICD-10-CM) - Rectal cancer [facility])  THERAPY DIAG:  Muscle weakness (generalized)  Other muscle  spasm  Unspecified lack of coordination  Abnormal posture  Other low back pain  Rationale for Evaluation and Treatment: Rehabilitation  ONSET DATE: 2022  SUBJECTIVE:                                                                                                                                                                                           SUBJECTIVE STATEMENT: Pt states that he took dulcolax Friday and then woke up in the middle of the night with several bowel movements. He had very frequent bowel movements yesterday and then had episode of  nocturnal enuresis last night for the first time. Dietician has recommended talking with MD about liquid ondansetron to titrate his dose to his needs better in addition to many other supplements with the guidance of trying only one thing at a time.   PAIN: 09/04/23 Are you having pain? Yes NPRS scale: 0/10 Pain location: low back  Pain type: aching and tight Pain description: constant   Aggravating factors: straining, sleeping positions, prolonged standing - coming up from bending Relieving factors: rest, avoiding straining  PRECAUTIONS: Other: rectal cancer 79777 diagnosis with surgery x2 and chemo  RED FLAGS: None   WEIGHT BEARING RESTRICTIONS: No  FALLS:  Has patient fallen in last 6 months? 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, mild disc/facet degeneration in mid-lower lumbar spine  BOWEL MOVEMENT: Pain with bowel movement: No Type of bowel movement:Type (Bristol Stool Scale) 1-7, Frequency random, and Strain Yes Fully empty rectum: No Leakage: Yes: improved with immodium and ondanestron - 3 or 4 accidents since reversal  Pads: No Fiber supplement: Yes: tried  metamucil in the past  URINATION: Pain with urination: No Fully empty bladder: Yes: - Stream: Strong Urgency: No Frequency: 1-2x/night; few times a day  Leakage: none Pads: No  INTERCOURSE: Pain with intercourse: none Climax: WNL Ejaculation: Yes: -   OBJECTIVE:  Note: Objective measures were completed at Evaluation unless otherwise noted. 03/22/23:             External Perineal Exam deferred to next session              Internal Pelvic Floor Significant restriction and scar tissue with tenderness  Patient confirms identification and approves PT to assess internal pelvic floor and treatment Yes   PELVIC MMT:    MMT eval  Internal Anal Sphincter 1/5  External Anal Sphincter 2/5 with multimodal cues to help isolate contraction  Puborectalis 1/5  Diastasis Recti 3 finger width diastasis with distortion  (Blank rows = not tested)  TONE: deferred to next session 03/15/23:  COGNITION: Overall cognitive status: Within functional limits for tasks assessed     SENSATION: Light touch: Appears intact Proprioception: Appears intact   FUNCTIONAL TESTS:  Squat: WNL, some Rt knee pain Single leg stance:  Rt: Lt pelvic drop  Lt:Rt pelvic drop Curl-up test: 3 finger width diastasis with distortion inferior/superior to umbilicus Sit-up test: 2/3  GAIT: Comments: WNL  POSTURE: rounded shoulders, forward head, decreased lumbar lordosis, decreased thoracic kyphosis, and posterior pelvic tilt  PELVIC ALIGNMENT: Rt posterior rotation  LUMBARAROM/PROM:  A/PROM A/PROM  Eval (% available)  Flexion 75  Extension 50  Right lateral flexion 50  Left lateral flexion 50  Right rotation 50  Left rotation 75   (Blank rows = not tested)   PALPATION: GENERAL significant scar tissue restriction in abdomen, Rt>Lt; tenderness in lower abdomen; rib cage restriction, lumbar paraspinal tone, Rt>Lt; tenderness at midline throughout lumbar and lower thoracic spine; decreased lumbar spine  mobility with PA mobilizations     TODAY'S TREATMENT:  DATE:  09/04/23 Manual: Manual scar tissue mobilization to abdomen Negative pressure soft tissue mobilization to abdominal scar tissue Abdominal skin rolling Ileocecal valve mobilization Rectal mobilization Neuromuscular re-education: Supine lower trunk rotation with LE elevated on swiss ball 10x bil Supine full shoulder flexion with LE elevated on swiss ball 2 x 10 5 lbs Supine leg extensions 10x bil Exercises: Open books 5x bil   08/30/23 Manual: Manual scar tissue mobilization to abdomen Negative pressure soft tissue mobilization to abdominal scar tissue Abdominal skin rolling Ileocecal valve mobilization Rectal mobilization Therapeutic activities: Review diet and hydration to help with stool consistency and frequency Went of rectal anatomy and how it should be more of a sensory organ than a storage organ - why stool should not stay in rectum for long periods of time, especially with shortened rectum   08/21/23 Manual: Manual scar tissue mobilization to abdomen Negative pressure soft tissue mobilization to abdominal scar tissue Abdominal skin rolling Neuromuscular re-education: Rehabilitative ultrasound imaging Biofeedback of transversus abdominus and pelvic floor muscle contractions while visualizing abdominal pressure Correction of abdominal pressure with visual feedback of screen and tactile feedback on transversus abdominus  Therapeutic activities: Being consistent with pelvic floor muscle strengthening program in sitting and standing positions Abdominal pressure management and importance in incontinence TENs at perineum to help cue contractions    PATIENT EDUCATION:  Education details: See above Person educated: Patient Education method: Programmer, multimedia, Demonstration, Tactile cues, Verbal  cues, and Handouts Education comprehension: verbalized understanding  HOME EXERCISE PROGRAM: [id number]  ASSESSMENT:  CLINICAL IMPRESSION: Pt has had difficulty over the weekend with frequent bowel movements and is going to cut back on dulcolax to every other day to see if this is helpful. He demonstrated more restriction throughout abdomen today, Rt>Lt, but good tolerance to manual techniques to help improve. We focused on several strengthening exercises that also promoted mobility throughout core/abdomen with frequent cues for increased core activation to maintain neutral spine and good pressure. He will continue to benefit from skilled PT intervention in order to decrease bowel dysfunction, improve abdominal pressure management, and begin/progress functional strengthening program.   OBJECTIVE IMPAIRMENTS: decreased activity tolerance, decreased coordination, decreased endurance, decreased mobility, decreased strength, increased fascial restrictions, increased muscle spasms, impaired tone, postural dysfunction, and pain.   ACTIVITY LIMITATIONS: continence  PARTICIPATION LIMITATIONS: community activity, occupation, and exercise  PERSONAL FACTORS: 1 comorbidity: medical history are also affecting patient's functional outcome.   REHAB POTENTIAL: Good  CLINICAL DECISION MAKING: Stable/uncomplicated  EVALUATION COMPLEXITY: Low   GOALS: Goals reviewed with patient? Yes  SHORT TERM GOALS: Target date: 04/12/2023 - updated 04/12/23    Pt will be independent with HEP.   Baseline: Goal status: MET 04/12/23  2.  Pt will be independent with use of squatty potty, relaxed toileting mechanics, and improved bowel movement techniques in order to increase ease of bowel movements and complete evacuation.   Baseline:  Goal status: MET 04/12/23  3.  Pt will perform daily self bowel mobilization in order to improve frequency and complete evacuation.  Baseline:  Goal status: Met 04/12/23    LONG  TERM GOALS: Updated 08/14/23  Pt will be independent with advanced HEP.   Baseline:  Goal status: IN PROGRESS 08/14/23  2.  Pt will demonstrate improved mobility in abdominal scar tissue. Baseline:  Goal status: IN PROGRESS 08/14/23  3.  Pt will be able to perform curl-up test in supine without abdominal distortion. Baseline: still has abdominal distortion, but with cuing for better transversus abdominus contraction he  was able to improve Goal status: IN PROGRESS 08/14/23  4.  Pt will be able to perform single leg stance bil without any pelvic drop in order to demonstrate improved functional core strength.  Baseline:  Goal status: MET 08/14/23  5.  Pt will report 75% improvement in fecal urgency. Baseline: he's currently at 40% improvement Goal status: IN PROGRESS 08/14/23  6.  Pt will report no episodes of fecal incontinence.  Baseline: in the last week, he has had 2 episodes of fecal incontinence Goal status: IN PROGRESS 08/14/23   PLAN:  PT FREQUENCY: 1-2x/week  PT DURATION: 6 months  PLANNED INTERVENTIONS: 97110-Therapeutic exercises, 97530- Therapeutic activity, 97112- Neuromuscular re-education, 97535- Self Care, [id number]- Manual therapy, Dry Needling, and Biofeedback  PLAN FOR NEXT SESSION: Continue manual techniques to pelvic scar tissue and low back; progress core training; progress mobility activities.    Josette Mares, PT, DPT07/21/258:59 AM

## 2023-09-19 NOTE — Procedures (Unsigned)
Result scanned to media

## 2023-09-21 ENCOUNTER — Ambulatory Visit: Attending: Internal Medicine

## 2023-10-02 ENCOUNTER — Other Ambulatory Visit: Payer: Self-pay | Admitting: Internal Medicine

## 2023-10-05 ENCOUNTER — Ambulatory Visit: Attending: Internal Medicine

## 2023-10-05 DIAGNOSIS — M5459 Other low back pain: Secondary | ICD-10-CM | POA: Diagnosis present

## 2023-10-05 DIAGNOSIS — R279 Unspecified lack of coordination: Secondary | ICD-10-CM | POA: Diagnosis present

## 2023-10-05 DIAGNOSIS — M62838 Other muscle spasm: Secondary | ICD-10-CM | POA: Insufficient documentation

## 2023-10-05 DIAGNOSIS — R293 Abnormal posture: Secondary | ICD-10-CM | POA: Diagnosis present

## 2023-10-05 DIAGNOSIS — M6281 Muscle weakness (generalized): Secondary | ICD-10-CM | POA: Diagnosis present

## 2023-10-05 NOTE — Therapy (Signed)
 OUTPATIENT PHYSICAL THERAPY MALE PELVIC TREATMENT   Patient Name: Edward [name] MRN: [id number] DOB:February 22, 1975, 48 y.o., male Today's Date: 10/05/2023  END OF SESSION:  PT End of Session - 10/05/23 0803     Visit Number 13    Date for PT Re-Evaluation 01/30/24    Authorization Type [employer]    Authorization Time Period signed dry needling consent 10/05/23    PT Start Time 0801    PT Stop Time 0842    PT Time Calculation (min) 41 min    Activity Tolerance Patient tolerated treatment well    Behavior During Therapy [facility] for tasks assessed/performed            Past Medical History:  Diagnosis Date   Arthritis    OA right knee   Family history of prostate cancer    Kidney stones    has a horse shoe kidney   Rectal cancer (HCC)    Thyroid  disease    History reviewed. No pertinent surgical history. Patient Active Problem List   Diagnosis Date Noted   Snoring 01/20/2023   Degenerative disc disease, cervical 01/02/2023   Chronic SI joint pain 01/02/2023   Midline back pain 06/08/2022   Right carpal tunnel syndrome 04/25/2022   Adjustment disorder 11/04/2021   Skin mole 08/11/2021   Extensor carpi ulnaris tendinitis 06/01/2021   Routine general medical examination at a health care facility 01/05/2021   Genetic testing 12/28/2020   [facility] joint pain 11/24/2020   Rectal cancer (HCC) 09/22/2020   Insomnia 09/22/2020   Hypothyroid 09/22/2020   Nonallopathic lesion of thoracic region 11/25/2018   Nonallopathic lesion of sacral region 11/25/2018   Nonallopathic lesion of lumbosacral region 11/25/2018   Nonallopathic lesion of rib cage 11/25/2018   Nonallopathic lesion of cervical region 11/25/2018   Trigger point of left shoulder region 08/29/2018   Patellofemoral arthralgia of right knee 07/25/2018   Scapular dyskinesis 07/25/2018    PCP: Rollene Almarie LABOR, MD  REFERRING PROVIDER: Rollene Almarie LABOR, MD  REFERRING DIAG: C20 (ICD-10-CM) - Rectal cancer  [facility])  THERAPY DIAG:  Muscle weakness (generalized)  Other muscle spasm  Unspecified lack of coordination  Abnormal posture  Other low back pain  Rationale for Evaluation and Treatment: Rehabilitation  ONSET DATE: 2022  SUBJECTIVE:                                                                                                                                                                                           SUBJECTIVE STATEMENT: Pt reports no accidents in the last 2 weeks. Pt states that he is only taking a half dose of Zofran and  feels like this is helping. He has been having a bowel movement every day.   PAIN: 10/05/23 Are you having pain? Yes NPRS scale: 7/10 Pain location: low back  Pain type: aching and tight Pain description: constant   Aggravating factors: straining, sleeping positions, prolonged standing - coming up from bending Relieving factors: rest, avoiding straining  PRECAUTIONS: Other: rectal cancer 79777 diagnosis with surgery x2 and chemo  RED FLAGS: None   WEIGHT BEARING RESTRICTIONS: No  FALLS:  Has patient fallen in last 6 months? 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, mild disc/facet degeneration in mid-lower lumbar spine  BOWEL MOVEMENT: Pain with bowel movement: No Type of bowel movement:Type (Bristol Stool Scale) 1-7, Frequency random, and Strain Yes Fully empty rectum: No Leakage: Yes: improved with immodium and ondanestron - 3 or 4 accidents since reversal  Pads: No Fiber supplement: Yes: tried metamucil in the past  URINATION: Pain with urination: No Fully empty bladder: Yes: - Stream: Strong Urgency: No Frequency: 1-2x/night; few times a day  Leakage: none Pads:  No  INTERCOURSE: Pain with intercourse: none Climax: WNL Ejaculation: Yes: -   OBJECTIVE:  Note: Objective measures were completed at Evaluation unless otherwise noted. 03/22/23:             External Perineal Exam deferred to next session              Internal Pelvic Floor Significant restriction and scar tissue with tenderness  Patient confirms identification and approves PT to assess internal pelvic floor and treatment Yes   PELVIC MMT:    MMT eval  Internal Anal Sphincter 1/5  External Anal Sphincter 2/5 with multimodal cues to help isolate contraction  Puborectalis 1/5  Diastasis Recti 3 finger width diastasis with distortion  (Blank rows = not tested)  TONE: deferred to next session 03/15/23:  COGNITION: Overall cognitive status: Within functional limits for tasks assessed     SENSATION: Light touch: Appears intact Proprioception: Appears intact   FUNCTIONAL TESTS:  Squat: WNL, some Rt knee pain Single leg stance:  Rt: Lt pelvic drop  Lt:Rt pelvic drop Curl-up test: 3 finger width diastasis with distortion inferior/superior to umbilicus Sit-up test: 2/3  GAIT: Comments: WNL  POSTURE: rounded shoulders, forward head, decreased lumbar lordosis, decreased thoracic kyphosis, and posterior pelvic tilt  PELVIC ALIGNMENT: Rt posterior rotation  LUMBARAROM/PROM:  A/PROM A/PROM  Eval (% available)  Flexion 75  Extension 50  Right lateral flexion 50  Left lateral flexion 50  Right rotation 50  Left rotation 75   (Blank rows = not tested)   PALPATION: GENERAL significant scar tissue restriction in abdomen, Rt>Lt; tenderness in lower abdomen; rib cage restriction, lumbar paraspinal tone, Rt>Lt; tenderness at midline throughout lumbar and lower thoracic spine; decreased lumbar spine mobility with PA mobilizations     TODAY'S TREATMENT:  DATE:  10/05/23 Trigger Point Dry Needling  Subsequent Treatment: Instructions provided previously at initial dry needling treatment.  Instructions reviewed, if requested by the patient, prior to subsequent dry needling treatment.   Patient Verbal Consent Given: Yes Education Handout Provided: Previously Provided Muscles Treated: bil L3-S2 multifidi, bil glutes Electrical Stimulation Performed: No Treatment Response/Outcome: notable twitch response and palpable improvement in muscular restriction  Manual: Prone myofascial release to low back Soft tissue mobilization to lumbar paraspinals and glutes Prone posterior/anterior mobilization to lumbar spine and sacral base grade 2-3 Prone negative pressure soft tissue mobilization   09/04/23 Manual: Manual scar tissue mobilization to abdomen Negative pressure soft tissue mobilization to abdominal scar tissue Abdominal skin rolling Ileocecal valve mobilization Rectal mobilization Neuromuscular re-education: Supine lower trunk rotation with LE elevated on swiss ball 10x bil Supine full shoulder flexion with LE elevated on swiss ball 2 x 10 5 lbs Supine leg extensions 10x bil Exercises: Open books 5x bil   08/30/23 Manual: Manual scar tissue mobilization to abdomen Negative pressure soft tissue mobilization to abdominal scar tissue Abdominal skin rolling Ileocecal valve mobilization Rectal mobilization Therapeutic activities: Review diet and hydration to help with stool consistency and frequency Went of rectal anatomy and how it should be more of a sensory organ than a storage organ - why stool should not stay in rectum for long periods of time, especially with shortened rectum  PATIENT EDUCATION:  Education details: See above Person educated: Patient Education method: Explanation, Demonstration, Tactile cues, Verbal cues, and Handouts Education comprehension: verbalized understanding  HOME EXERCISE  PROGRAM: [id number]  ASSESSMENT:  CLINICAL IMPRESSION: Pt has been doing much better with fecal incontinence since changing dose of zofran and having consistent bowel movements. However, he has been having much more low back pain. He requested dry needling and was ok with additional cost. He tolerated dry needling in lumbar multifidi well, but had significant twitch and pain in glutes and we limited number of needles here due to discomfort. He tolerated other manual techniques well. He will continue to benefit from skilled PT intervention in order to decrease bowel dysfunction, improve abdominal pressure management, and begin/progress functional strengthening program.   OBJECTIVE IMPAIRMENTS: decreased activity tolerance, decreased coordination, decreased endurance, decreased mobility, decreased strength, increased fascial restrictions, increased muscle spasms, impaired tone, postural dysfunction, and pain.   ACTIVITY LIMITATIONS: continence  PARTICIPATION LIMITATIONS: community activity, occupation, and exercise  PERSONAL FACTORS: 1 comorbidity: medical history are also affecting patient's functional outcome.   REHAB POTENTIAL: Good  CLINICAL DECISION MAKING: Stable/uncomplicated  EVALUATION COMPLEXITY: Low   GOALS: Goals reviewed with patient? Yes  SHORT TERM GOALS: Target date: 04/12/2023 - updated 04/12/23    Pt will be independent with HEP.   Baseline: Goal status: MET 04/12/23  2.  Pt will be independent with use of squatty potty, relaxed toileting mechanics, and improved bowel movement techniques in order to increase ease of bowel movements and complete evacuation.   Baseline:  Goal status: MET 04/12/23  3.  Pt will perform daily self bowel mobilization in order to improve frequency and complete evacuation.  Baseline:  Goal status: Met 04/12/23    LONG TERM GOALS: Updated 10/05/23  Pt will be independent with advanced HEP.   Baseline:  Goal status: IN PROGRESS  10/05/23  2.  Pt will demonstrate improved mobility in abdominal scar tissue. Baseline:  Goal status: IN PROGRESS 10/05/23  3.  Pt will be able to perform curl-up test in supine without abdominal distortion. Baseline: still has  abdominal distortion, but with cuing for better transversus abdominus contraction he was able to improve Goal status: IN PROGRESS 10/05/23  4.  Pt will be able to perform single leg stance bil without any pelvic drop in order to demonstrate improved functional core strength.  Baseline:  Goal status: MET 08/14/23  5.  Pt will report 75% improvement in fecal urgency. Baseline: he's currently at 40% improvement Goal status: IN PROGRESS 10/05/23  6.  Pt will report no episodes of fecal incontinence.  Baseline: in the last week, he has had 2 episodes of fecal incontinence Goal status: IN PROGRESS 10/05/23   PLAN:  PT FREQUENCY: 1-2x/week  PT DURATION: 6 months  PLANNED INTERVENTIONS: 97110-Therapeutic exercises, 97530- Therapeutic activity, 97112- Neuromuscular re-education, 97535- Self Care, 02859- Manual therapy, Dry Needling, and Biofeedback  PLAN FOR NEXT SESSION: Continue manual techniques to pelvic scar tissue and low back; progress core training; progress mobility activities.    Josette Mares, PT, DPT08/21/258:18 AM

## 2023-10-12 ENCOUNTER — Ambulatory Visit

## 2023-10-12 DIAGNOSIS — M6281 Muscle weakness (generalized): Secondary | ICD-10-CM | POA: Diagnosis not present

## 2023-10-12 DIAGNOSIS — M5459 Other low back pain: Secondary | ICD-10-CM

## 2023-10-12 DIAGNOSIS — M62838 Other muscle spasm: Secondary | ICD-10-CM

## 2023-10-12 DIAGNOSIS — R293 Abnormal posture: Secondary | ICD-10-CM

## 2023-10-12 DIAGNOSIS — R279 Unspecified lack of coordination: Secondary | ICD-10-CM

## 2023-10-12 NOTE — Therapy (Signed)
 OUTPATIENT PHYSICAL THERAPY MALE PELVIC TREATMENT   Patient Name: Edward [name] MRN: [id number] DOB:31-Mar-1975, 48 y.o., male Today's Date: 10/12/2023  END OF SESSION:  PT End of Session - 10/12/23 0800     Visit Number 14    Date for PT Re-Evaluation 01/30/24    Authorization Type [employer]    Authorization Time Period signed dry needling consent 10/05/23    PT Start Time 0800    PT Stop Time 0842    PT Time Calculation (min) 42 min    Activity Tolerance Patient tolerated treatment well    Behavior During Therapy WFL for tasks assessed/performed            Past Medical History:  Diagnosis Date   Arthritis    OA right knee   Family history of prostate cancer    Kidney stones    has a horse shoe kidney   Rectal cancer (HCC)    Thyroid  disease    History reviewed. No pertinent surgical history. Patient Active Problem List   Diagnosis Date Noted   Snoring 01/20/2023   Degenerative disc disease, cervical 01/02/2023   Chronic SI joint pain 01/02/2023   Midline back pain 06/08/2022   Right carpal tunnel syndrome 04/25/2022   Adjustment disorder 11/04/2021   Skin mole 08/11/2021   Extensor carpi ulnaris tendinitis 06/01/2021   Routine general medical examination at a health care facility 01/05/2021   Genetic testing 12/28/2020   [facility] joint pain 11/24/2020   Rectal cancer (HCC) 09/22/2020   Insomnia 09/22/2020   Hypothyroid 09/22/2020   Nonallopathic lesion of thoracic region 11/25/2018   Nonallopathic lesion of sacral region 11/25/2018   Nonallopathic lesion of lumbosacral region 11/25/2018   Nonallopathic lesion of rib cage 11/25/2018   Nonallopathic lesion of cervical region 11/25/2018   Trigger point of left shoulder region 08/29/2018   Patellofemoral arthralgia of right knee 07/25/2018   Scapular dyskinesis 07/25/2018    PCP: Rollene Almarie LABOR, MD  REFERRING PROVIDER: Rollene Almarie LABOR, MD  REFERRING DIAG: C20 (ICD-10-CM) - Rectal cancer  [facility])  THERAPY DIAG:  Muscle weakness (generalized)  Other muscle spasm  Unspecified lack of coordination  Abnormal posture  Other low back pain  Rationale for Evaluation and Treatment: Rehabilitation  ONSET DATE: 2022  SUBJECTIVE:                                                                                                                                                                                           SUBJECTIVE STATEMENT: Pt states that his back is improving, but still having more pain than normal. He states that he had an accident last  night at home. He has been on new dosage of medication for 2 weeks now. He still feels like he has urge a lot, but then is unable to be productive.   PAIN: 10/12/23 Are you having pain? Yes NPRS scale: 5-6/10 Pain location: low back  Pain type: aching and tight Pain description: constant   Aggravating factors: straining, sleeping positions, prolonged standing - coming up from bending Relieving factors: rest, avoiding straining  PRECAUTIONS: Other: rectal cancer 2022 diagnosis with surgery x2 and chemo  RED FLAGS: None   WEIGHT BEARING RESTRICTIONS: No  FALLS:  Has patient fallen in last 6 months? 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, mild disc/facet degeneration in mid-lower lumbar spine  BOWEL MOVEMENT: Pain with bowel movement: No Type of bowel movement:Type (Bristol Stool Scale) 1-7, Frequency random, and Strain Yes Fully empty rectum: No Leakage: Yes: improved with immodium and ondanestron - 3 or 4 accidents since reversal  Pads: No Fiber supplement: Yes: tried metamucil in the past  URINATION: Pain with urination: No Fully empty bladder: Yes:  - Stream: Strong Urgency: No Frequency: 1-2x/night; few times a day  Leakage: none Pads: No  INTERCOURSE: Pain with intercourse: none Climax: WNL Ejaculation: Yes: -   OBJECTIVE:  Note: Objective measures were completed at Evaluation unless otherwise noted. 03/22/23:             External Perineal Exam deferred to next session              Internal Pelvic Floor Significant restriction and scar tissue with tenderness  Patient confirms identification and approves PT to assess internal pelvic floor and treatment Yes   PELVIC MMT:    MMT eval  Internal Anal Sphincter 1/5  External Anal Sphincter 2/5 with multimodal cues to help isolate contraction  Puborectalis 1/5  Diastasis Recti 3 finger width diastasis with distortion  (Blank rows = not tested)  TONE: deferred to next session 03/15/23:  COGNITION: Overall cognitive status: Within functional limits for tasks assessed     SENSATION: Light touch: Appears intact Proprioception: Appears intact   FUNCTIONAL TESTS:  Squat: WNL, some Rt knee pain Single leg stance:  Rt: Lt pelvic drop  Lt:Rt pelvic drop Curl-up test: 3 finger width diastasis with distortion inferior/superior to umbilicus Sit-up test: 2/3  GAIT: Comments: WNL  POSTURE: rounded shoulders, forward head, decreased lumbar lordosis, decreased thoracic kyphosis, and posterior pelvic tilt  PELVIC ALIGNMENT: Rt posterior rotation  LUMBARAROM/PROM:  A/PROM A/PROM  Eval (% available)  Flexion 75  Extension 50  Right lateral flexion 50  Left lateral flexion 50  Right rotation 50  Left rotation 75   (Blank rows = not tested)   PALPATION: GENERAL significant scar tissue restriction in abdomen, Rt>Lt; tenderness in lower abdomen; rib cage restriction, lumbar paraspinal tone, Rt>Lt; tenderness at midline throughout lumbar and lower thoracic spine; decreased lumbar spine mobility with PA mobilizations     TODAY'S TREATMENT:  DATE:  10/12/23 Manual: Prone myofascial release to low back Soft tissue mobilization to lumbar paraspinals and glutes Prone posterior/anterior mobilization to lumbar spine and sacral base grade 2-3 Prone negative pressure soft tissue mobilization  Exercises: Prone manual stretching of bil LE into hip flexion, external rotation, and internal rotation  Therapeutic activities: Review urge drill with bowel urgency Consistency with regimen he is doing for bowel control; possibly working on increasing fiber   10/05/23 Trigger Point Dry Needling  Subsequent Treatment: Instructions provided previously at initial dry needling treatment.  Instructions reviewed, if requested by the patient, prior to subsequent dry needling treatment.   Patient Verbal Consent Given: Yes Education Handout Provided: Previously Provided Muscles Treated: bil L3-S2 multifidi, bil glutes Electrical Stimulation Performed: No Treatment Response/Outcome: notable twitch response and palpable improvement in muscular restriction  Manual: Prone myofascial release to low back Soft tissue mobilization to lumbar paraspinals and glutes Prone posterior/anterior mobilization to lumbar spine and sacral base grade 2-3 Prone negative pressure soft tissue mobilization   09/04/23 Manual: Manual scar tissue mobilization to abdomen Negative pressure soft tissue mobilization to abdominal scar tissue Abdominal skin rolling Ileocecal valve mobilization Rectal mobilization Neuromuscular re-education: Supine lower trunk rotation with LE elevated on swiss ball 10x bil Supine full shoulder flexion with LE elevated on swiss ball 2 x 10 5 lbs Supine leg extensions 10x bil Exercises: Open books 5x bil   08/30/23 Manual: Manual scar tissue mobilization to abdomen Negative pressure soft tissue mobilization to abdominal scar tissue Abdominal  skin rolling Ileocecal valve mobilization Rectal mobilization Therapeutic activities: Review diet and hydration to help with stool consistency and frequency Went of rectal anatomy and how it should be more of a sensory organ than a storage organ - why stool should not stay in rectum for long periods of time, especially with shortened rectum  PATIENT EDUCATION:  Education details: See above Person educated: Patient Education method: Explanation, Demonstration, Tactile cues, Verbal cues, and Handouts Education comprehension: verbalized understanding  HOME EXERCISE PROGRAM: [id number]  ASSESSMENT:  CLINICAL IMPRESSION: Pt did have accident last night, but he states that he is encouraged because this is the first accident in 3 weeks. We reviewed urge drill and discussed use when he starts getting warning symptoms that he is about to have diarrhea. Since he does get good warning signs, believe this will be very helpful. We also discussed low back pain and that working on core strengthening should be very helpful. We focused on manual techniques today to help improve pain. He has notable restriction, Rt>Lt, that is muscular and likely large contributing factor to pain. This is likely coming from lack of motor control at L5-S1 transition. He will continue to benefit from skilled PT intervention in order to decrease bowel dysfunction, improve abdominal pressure management, and begin/progress functional strengthening program.   OBJECTIVE IMPAIRMENTS: decreased activity tolerance, decreased coordination, decreased endurance, decreased mobility, decreased strength, increased fascial restrictions, increased muscle spasms, impaired tone, postural dysfunction, and pain.   ACTIVITY LIMITATIONS: continence  PARTICIPATION LIMITATIONS: community activity, occupation, and exercise  PERSONAL FACTORS: 1 comorbidity: medical history are also affecting patient's functional outcome.   REHAB POTENTIAL:  Good  CLINICAL DECISION MAKING: Stable/uncomplicated  EVALUATION COMPLEXITY: Low   GOALS: Goals reviewed with patient? Yes  SHORT TERM GOALS: Target date: 04/12/2023 - updated 04/12/23    Pt will be independent with HEP.   Baseline: Goal status: MET 04/12/23  2.  Pt will be independent with use of squatty potty, relaxed toileting mechanics, and  improved bowel movement techniques in order to increase ease of bowel movements and complete evacuation.   Baseline:  Goal status: MET 04/12/23  3.  Pt will perform daily self bowel mobilization in order to improve frequency and complete evacuation.  Baseline:  Goal status: Met 04/12/23    LONG TERM GOALS: Updated 10/05/23  Pt will be independent with advanced HEP.   Baseline:  Goal status: IN PROGRESS 10/05/23  2.  Pt will demonstrate improved mobility in abdominal scar tissue. Baseline:  Goal status: IN PROGRESS 10/05/23  3.  Pt will be able to perform curl-up test in supine without abdominal distortion. Baseline: still has abdominal distortion, but with cuing for better transversus abdominus contraction he was able to improve Goal status: IN PROGRESS 10/05/23  4.  Pt will be able to perform single leg stance bil without any pelvic drop in order to demonstrate improved functional core strength.  Baseline:  Goal status: MET 08/14/23  5.  Pt will report 75% improvement in fecal urgency. Baseline: he's currently at 40% improvement Goal status: IN PROGRESS 10/05/23  6.  Pt will report no episodes of fecal incontinence.  Baseline: in the last week, he has had 2 episodes of fecal incontinence Goal status: IN PROGRESS 10/05/23   PLAN:  PT FREQUENCY: 1-2x/week  PT DURATION: 6 months  PLANNED INTERVENTIONS: 97110-Therapeutic exercises, 97530- Therapeutic activity, 97112- Neuromuscular re-education, 97535- Self Care, 02859- Manual therapy, Dry Needling, and Biofeedback  PLAN FOR NEXT SESSION: Continue manual techniques to pelvic  scar tissue and low back; progress core training; progress mobility activities.    Josette Mares, PT, DPT08/28/258:44 AM

## 2023-10-19 ENCOUNTER — Ambulatory Visit: Attending: Internal Medicine

## 2023-10-19 DIAGNOSIS — R279 Unspecified lack of coordination: Secondary | ICD-10-CM | POA: Insufficient documentation

## 2023-10-19 DIAGNOSIS — M6281 Muscle weakness (generalized): Secondary | ICD-10-CM | POA: Insufficient documentation

## 2023-10-19 DIAGNOSIS — M62838 Other muscle spasm: Secondary | ICD-10-CM | POA: Diagnosis present

## 2023-10-19 DIAGNOSIS — R293 Abnormal posture: Secondary | ICD-10-CM | POA: Diagnosis present

## 2023-10-19 DIAGNOSIS — M5459 Other low back pain: Secondary | ICD-10-CM | POA: Diagnosis present

## 2023-10-19 NOTE — Therapy (Signed)
 OUTPATIENT PHYSICAL THERAPY MALE PELVIC TREATMENT   Patient Name: Edward [name] MRN: [id number] DOB:Jun 15, 1975, 48 y.o., male Today's Date: 10/19/2023  END OF SESSION:  PT End of Session - 10/19/23 0801     Visit Number 15    Date for PT Re-Evaluation 01/30/24    Authorization Type [employer]    Authorization Time Period signed dry needling consent 10/19/23    PT Start Time 0801    PT Stop Time 0842    PT Time Calculation (min) 41 min    Activity Tolerance Patient tolerated treatment well    Behavior During Therapy [facility] for tasks assessed/performed            Past Medical History:  Diagnosis Date   Arthritis    OA right knee   Family history of prostate cancer    Kidney stones    has a horse shoe kidney   Rectal cancer (HCC)    Thyroid  disease    History reviewed. No pertinent surgical history. Patient Active Problem List   Diagnosis Date Noted   Snoring 01/20/2023   Degenerative disc disease, cervical 01/02/2023   Chronic SI joint pain 01/02/2023   Midline back pain 06/08/2022   Right carpal tunnel syndrome 04/25/2022   Adjustment disorder 11/04/2021   Skin mole 08/11/2021   Extensor carpi ulnaris tendinitis 06/01/2021   Routine general medical examination at a health care facility 01/05/2021   Genetic testing 12/28/2020   [facility] joint pain 11/24/2020   Rectal cancer (HCC) 09/22/2020   Insomnia 09/22/2020   Hypothyroid 09/22/2020   Nonallopathic lesion of thoracic region 11/25/2018   Nonallopathic lesion of sacral region 11/25/2018   Nonallopathic lesion of lumbosacral region 11/25/2018   Nonallopathic lesion of rib cage 11/25/2018   Nonallopathic lesion of cervical region 11/25/2018   Trigger point of left shoulder region 08/29/2018   Patellofemoral arthralgia of right knee 07/25/2018   Scapular dyskinesis 07/25/2018    PCP: Rollene Almarie LABOR, MD  REFERRING PROVIDER: Rollene Almarie LABOR, MD  REFERRING DIAG: C20 (ICD-10-CM) - Rectal cancer [facility])  THERAPY  DIAG:  Muscle weakness (generalized)  Other muscle spasm  Unspecified lack of coordination  Abnormal posture  Other low back pain  Rationale for Evaluation and Treatment: Rehabilitation  ONSET DATE: 2022  SUBJECTIVE:                                                                                                                                                                                           SUBJECTIVE STATEMENT: He states that he has not had any accidents since his last appointment, but too instances of rushing to the bathroom. He  is not taking the medication as often as he thought he was going to have to. He is still experiencing some constipation even with taking less frequently. He states that his back continues to improve.   PAIN: 10/19/23 Are you having pain? Yes NPRS scale: 5/10 Pain location: low back  Pain type: aching and tight Pain description: constant   Aggravating factors: straining, sleeping positions, prolonged standing - coming up from bending Relieving factors: rest, avoiding straining  PRECAUTIONS: Other: rectal cancer 2022 diagnosis with surgery x2 and chemo  RED FLAGS: None   WEIGHT BEARING RESTRICTIONS: No  FALLS:  Has patient fallen in last 6 months? 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, mild disc/facet degeneration in mid-lower lumbar spine  BOWEL MOVEMENT: Pain with bowel movement: No Type of bowel movement:Type (Bristol Stool Scale) 1-7, Frequency random, and Strain Yes Fully empty rectum: No Leakage: Yes: improved with immodium and ondanestron - 3 or 4 accidents since reversal  Pads: No Fiber supplement: Yes: tried metamucil in the past  URINATION: Pain with urination:  No Fully empty bladder: Yes: - Stream: Strong Urgency: No Frequency: 1-2x/night; few times a day  Leakage: none Pads: No  INTERCOURSE: Pain with intercourse: none Climax: WNL Ejaculation: Yes: -   OBJECTIVE:  Note: Objective measures were completed at Evaluation unless otherwise noted. 03/22/23:             External Perineal Exam deferred to next session              Internal Pelvic Floor Significant restriction and scar tissue with tenderness  Patient confirms identification and approves PT to assess internal pelvic floor and treatment Yes   PELVIC MMT:    MMT eval  Internal Anal Sphincter 1/5  External Anal Sphincter 2/5 with multimodal cues to help isolate contraction  Puborectalis 1/5  Diastasis Recti 3 finger width diastasis with distortion  (Blank rows = not tested)  TONE: deferred to next session 03/15/23:  COGNITION: Overall cognitive status: Within functional limits for tasks assessed     SENSATION: Light touch: Appears intact Proprioception: Appears intact   FUNCTIONAL TESTS:  Squat: WNL, some Rt knee pain Single leg stance:  Rt: Lt pelvic drop  Lt:Rt pelvic drop Curl-up test: 3 finger width diastasis with distortion inferior/superior to umbilicus Sit-up test: 2/3  GAIT: Comments: WNL  POSTURE: rounded shoulders, forward head, decreased lumbar lordosis, decreased thoracic kyphosis, and posterior pelvic tilt  PELVIC ALIGNMENT: Rt posterior rotation  LUMBARAROM/PROM:  A/PROM A/PROM  Eval (% available)  Flexion 75  Extension 50  Right lateral flexion 50  Left lateral flexion 50  Right rotation 50  Left rotation 75   (Blank rows = not tested)   PALPATION: GENERAL significant scar tissue restriction in abdomen, Rt>Lt; tenderness in lower abdomen; rib cage restriction, lumbar paraspinal tone, Rt>Lt; tenderness at midline throughout lumbar and lower thoracic spine; decreased lumbar spine mobility with PA mobilizations     TODAY'S TREATMENT:  DATE:  10/19/23 Trigger Point Dry Needling  Subsequent Treatment: Instructions provided previously at initial dry needling treatment.  Instructions reviewed, if requested by the patient, prior to subsequent dry needling treatment.   Patient Verbal Consent Given: Yes Education Handout Provided: Yes Muscles Treated: L4-S1 multifidi Electrical Stimulation Performed: No Treatment Response/Outcome: palpable improvement in soft tissue restriction   Manual: Prone myofascial release to low back Soft tissue mobilization to lumbar paraspinals and glutes Prone posterior/anterior mobilization to lumbar spine and sacral base grade 2-3 Prone negative pressure soft tissue mobilization    10/12/23 Manual: Prone myofascial release to low back Soft tissue mobilization to lumbar paraspinals and glutes Prone posterior/anterior mobilization to lumbar spine and sacral base grade 2-3 Prone negative pressure soft tissue mobilization  Exercises: Prone manual stretching of bil LE into hip flexion, external rotation, and internal rotation  Therapeutic activities: Review urge drill with bowel urgency Consistency with regimen he is doing for bowel control; possibly working on increasing fiber   10/05/23 Trigger Point Dry Needling  Subsequent Treatment: Instructions provided previously at initial dry needling treatment.  Instructions reviewed, if requested by the patient, prior to subsequent dry needling treatment.   Patient Verbal Consent Given: Yes Education Handout Provided: Previously Provided Muscles Treated: bil L3-S2 multifidi, bil glutes Electrical Stimulation Performed: No Treatment Response/Outcome: notable twitch response and palpable improvement in muscular restriction  Manual: Prone myofascial release to low back Soft tissue mobilization to lumbar paraspinals and  glutes Prone posterior/anterior mobilization to lumbar spine and sacral base grade 2-3 Prone negative pressure soft tissue mobilization     PATIENT EDUCATION:  Education details: See above Person educated: Patient Education method: Programmer, multimedia, Demonstration, Actor cues, Verbal cues, and Handouts Education comprehension: verbalized understanding  HOME EXERCISE PROGRAM: [id number]  ASSESSMENT:  CLINICAL IMPRESSION: Pt doing better with low back pain, but continues to have pinpoint tenderness at L5 and tightness that moves laterally across low back from that point bil. Manual techniques only improved comfort at this level mildly; due to this, we decided to perform dry needling and patient agreed to extra cost. L5 demonstrated Rt facing rotation that was significant based on dry needling to lamina bil. This could be straining spinous ligament where his pain is located. Dn and manual techniques hopefully will reduce this and core strengthening will help support in more neutral position moving forward. All treatment tolerated very well today. He will continue to benefit from skilled PT intervention in order to decrease bowel dysfunction, improve abdominal pressure management, and begin/progress functional strengthening program.   OBJECTIVE IMPAIRMENTS: decreased activity tolerance, decreased coordination, decreased endurance, decreased mobility, decreased strength, increased fascial restrictions, increased muscle spasms, impaired tone, postural dysfunction, and pain.   ACTIVITY LIMITATIONS: continence  PARTICIPATION LIMITATIONS: community activity, occupation, and exercise  PERSONAL FACTORS: 1 comorbidity: medical history are also affecting patient's functional outcome.   REHAB POTENTIAL: Good  CLINICAL DECISION MAKING: Stable/uncomplicated  EVALUATION COMPLEXITY: Low   GOALS: Goals reviewed with patient? Yes  SHORT TERM GOALS: Target date: 04/12/2023 - updated 04/12/23    Pt will  be independent with HEP.   Baseline: Goal status: MET 04/12/23  2.  Pt will be independent with use of squatty potty, relaxed toileting mechanics, and improved bowel movement techniques in order to increase ease of bowel movements and complete evacuation.   Baseline:  Goal status: MET 04/12/23  3.  Pt will perform daily self bowel mobilization in order to improve frequency and complete evacuation.  Baseline:  Goal status: Met 04/12/23  LONG TERM GOALS: Updated 10/05/23  Pt will be independent with advanced HEP.   Baseline:  Goal status: IN PROGRESS 10/05/23  2.  Pt will demonstrate improved mobility in abdominal scar tissue. Baseline:  Goal status: IN PROGRESS 10/05/23  3.  Pt will be able to perform curl-up test in supine without abdominal distortion. Baseline: still has abdominal distortion, but with cuing for better transversus abdominus contraction he was able to improve Goal status: IN PROGRESS 10/05/23  4.  Pt will be able to perform single leg stance bil without any pelvic drop in order to demonstrate improved functional core strength.  Baseline:  Goal status: MET 08/14/23  5.  Pt will report 75% improvement in fecal urgency. Baseline: he's currently at 40% improvement Goal status: IN PROGRESS 10/05/23  6.  Pt will report no episodes of fecal incontinence.  Baseline: in the last week, he has had 2 episodes of fecal incontinence Goal status: IN PROGRESS 10/05/23   PLAN:  PT FREQUENCY: 1-2x/week  PT DURATION: 6 months  PLANNED INTERVENTIONS: 97110-Therapeutic exercises, 97530- Therapeutic activity, 97112- Neuromuscular re-education, 97535- Self Care, [id number]- Manual therapy, Dry Needling, and Biofeedback  PLAN FOR NEXT SESSION: Continue manual techniques to pelvic scar tissue and low back; progress core training; progress mobility activities.    Josette Mares, PT, DPT09/04/258:44 AM

## 2023-10-25 ENCOUNTER — Ambulatory Visit

## 2023-10-25 DIAGNOSIS — M6281 Muscle weakness (generalized): Secondary | ICD-10-CM | POA: Diagnosis not present

## 2023-10-25 DIAGNOSIS — R279 Unspecified lack of coordination: Secondary | ICD-10-CM

## 2023-10-25 DIAGNOSIS — M62838 Other muscle spasm: Secondary | ICD-10-CM

## 2023-10-25 DIAGNOSIS — R293 Abnormal posture: Secondary | ICD-10-CM

## 2023-10-25 DIAGNOSIS — M5459 Other low back pain: Secondary | ICD-10-CM

## 2023-10-25 NOTE — Therapy (Signed)
 OUTPATIENT PHYSICAL THERAPY MALE PELVIC TREATMENT   Patient Name: Edward [name] MRN: [id number] DOB:1975/11/17, 48 y.o., male Today's Date: 10/25/2023  END OF SESSION:  PT End of Session - 10/25/23 0850     Visit Number 16    Date for PT Re-Evaluation 01/30/24    Authorization Type [employer]    Authorization Time Period signed dry needling consent 10/19/23    PT Start Time 0848    PT Stop Time 0927    PT Time Calculation (min) 39 min    Activity Tolerance Patient tolerated treatment well    Behavior During Therapy [facility] for tasks assessed/performed             Past Medical History:  Diagnosis Date   Arthritis    OA right knee   Family history of prostate cancer    Kidney stones    has a horse shoe kidney   Rectal cancer (HCC)    Thyroid  disease    History reviewed. No pertinent surgical history. Patient Active Problem List   Diagnosis Date Noted   Snoring 01/20/2023   Degenerative disc disease, cervical 01/02/2023   Chronic SI joint pain 01/02/2023   Midline back pain 06/08/2022   Right carpal tunnel syndrome 04/25/2022   Adjustment disorder 11/04/2021   Skin mole 08/11/2021   Extensor carpi ulnaris tendinitis 06/01/2021   Routine general medical examination at a health care facility 01/05/2021   Genetic testing 12/28/2020   [facility] joint pain 11/24/2020   Rectal cancer (HCC) 09/22/2020   Insomnia 09/22/2020   Hypothyroid 09/22/2020   Nonallopathic lesion of thoracic region 11/25/2018   Nonallopathic lesion of sacral region 11/25/2018   Nonallopathic lesion of lumbosacral region 11/25/2018   Nonallopathic lesion of rib cage 11/25/2018   Nonallopathic lesion of cervical region 11/25/2018   Trigger point of left shoulder region 08/29/2018   Patellofemoral arthralgia of right knee 07/25/2018   Scapular dyskinesis 07/25/2018    PCP: Rollene Almarie LABOR, MD  REFERRING PROVIDER: Rollene Almarie LABOR, MD  REFERRING DIAG: C20 (ICD-10-CM) - Rectal cancer  [facility])  THERAPY DIAG:  Muscle weakness (generalized)  Other muscle spasm  Unspecified lack of coordination  Abnormal posture  Other low back pain  Rationale for Evaluation and Treatment: Rehabilitation  ONSET DATE: 2022  SUBJECTIVE:                                                                                                                                                                                           SUBJECTIVE STATEMENT: Pt states that he had another accident on Saturday. He still feels constipated even on the lower dose of medication. He  does feel like the accidents are further apart. He states that his low back is feeling much better.   PAIN: 10/25/23 Are you having pain? Yes NPRS scale: 1/10 Pain location: low back  Pain type: aching and tight Pain description: constant   Aggravating factors: straining, sleeping positions, prolonged standing - coming up from bending Relieving factors: rest, avoiding straining  PRECAUTIONS: Other: rectal cancer 2022 diagnosis with surgery x2 and chemo  RED FLAGS: None   WEIGHT BEARING RESTRICTIONS: No  FALLS:  Has patient fallen in last 6 months? 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, mild disc/facet degeneration in mid-lower lumbar spine  BOWEL MOVEMENT: Pain with bowel movement: No Type of bowel movement:Type (Bristol Stool Scale) 1-7, Frequency random, and Strain Yes Fully empty rectum: No Leakage: Yes: improved with immodium and ondanestron - 3 or 4 accidents since reversal  Pads: No Fiber supplement: Yes: tried metamucil in the past  URINATION: Pain with urination: No Fully empty bladder: Yes: - Stream: Strong Urgency: No Frequency: 1-2x/night;  few times a day  Leakage: none Pads: No  INTERCOURSE: Pain with intercourse: none Climax: WNL Ejaculation: Yes: -   OBJECTIVE:  Note: Objective measures were completed at Evaluation unless otherwise noted. 03/22/23:             External Perineal Exam deferred to next session              Internal Pelvic Floor Significant restriction and scar tissue with tenderness  Patient confirms identification and approves PT to assess internal pelvic floor and treatment Yes   PELVIC MMT:    MMT eval  Internal Anal Sphincter 1/5  External Anal Sphincter 2/5 with multimodal cues to help isolate contraction  Puborectalis 1/5  Diastasis Recti 3 finger width diastasis with distortion  (Blank rows = not tested)  TONE: deferred to next session 03/15/23:  COGNITION: Overall cognitive status: Within functional limits for tasks assessed     SENSATION: Light touch: Appears intact Proprioception: Appears intact   FUNCTIONAL TESTS:  Squat: WNL, some Rt knee pain Single leg stance:  Rt: Lt pelvic drop  Lt:Rt pelvic drop Curl-up test: 3 finger width diastasis with distortion inferior/superior to umbilicus Sit-up test: 2/3  GAIT: Comments: WNL  POSTURE: rounded shoulders, forward head, decreased lumbar lordosis, decreased thoracic kyphosis, and posterior pelvic tilt  PELVIC ALIGNMENT: Rt posterior rotation  LUMBARAROM/PROM:  A/PROM A/PROM  Eval (% available)  Flexion 75  Extension 50  Right lateral flexion 50  Left lateral flexion 50  Right rotation 50  Left rotation 75   (Blank rows = not tested)   PALPATION: GENERAL significant scar tissue restriction in abdomen, Rt>Lt; tenderness in lower abdomen; rib cage restriction, lumbar paraspinal tone, Rt>Lt; tenderness at midline throughout lumbar and lower thoracic spine; decreased lumbar spine mobility with PA mobilizations     TODAY'S TREATMENT:  DATE:  10/25/23 Manual: Supine abdominal scar tissue mobilization Supine bowel mobiliation Supine abdominal soft tissue mobilization  Neuromuscular re-education: Supine elevated LE lower trunk rotation + transversus abdominus 2 x 10 Supine full shoulder flexion + transversus abdominus + 10lbs 2 x 10 Bridge with unilateral chest press + 10lbs  Supine full shoulder flexion + single LE extension + 10 lbs 2 x 10 bil Wall/ball plank + march 2 x 10 Exercises: Ball up wall (yellow) 10x   10/19/23 Trigger Point Dry Needling  Subsequent Treatment: Instructions provided previously at initial dry needling treatment.  Instructions reviewed, if requested by the patient, prior to subsequent dry needling treatment.   Patient Verbal Consent Given: Yes Education Handout Provided: Yes Muscles Treated: L4-S1 multifidi Electrical Stimulation Performed: No Treatment Response/Outcome: palpable improvement in soft tissue restriction   Manual: Prone myofascial release to low back Soft tissue mobilization to lumbar paraspinals and glutes Prone posterior/anterior mobilization to lumbar spine and sacral base grade 2-3 Prone negative pressure soft tissue mobilization    10/12/23 Manual: Prone myofascial release to low back Soft tissue mobilization to lumbar paraspinals and glutes Prone posterior/anterior mobilization to lumbar spine and sacral base grade 2-3 Prone negative pressure soft tissue mobilization  Exercises: Prone manual stretching of bil LE into hip flexion, external rotation, and internal rotation  Therapeutic activities: Review urge drill with bowel urgency Consistency with regimen he is doing for bowel control; possibly working on increasing fiber  PATIENT EDUCATION:  Education details: See above Person educated: Patient Education method: Programmer, multimedia, Facilities manager, Actor cues, Verbal cues, and Handouts Education comprehension: verbalized  understanding  HOME EXERCISE PROGRAM: [id number]  ASSESSMENT:  CLINICAL IMPRESSION: Pt did have leak over the weekend and more constipation, but feels like he is seeing a decrease in these episodes. Due to improvement in low back pain, we focused on abdominal scar tissue mobilization. He is seeing improvement here, but there is still restriction that could be impacting quality of bowel movements and cycles of constipation/diarrhea. Good tolerance to all core strengthening progressions. He does still get small coning with more challenging activities, but some good improvement with cues to engage core. He will continue to benefit from skilled PT intervention in order to decrease bowel dysfunction, improve abdominal pressure management, and begin/progress functional strengthening program.   OBJECTIVE IMPAIRMENTS: decreased activity tolerance, decreased coordination, decreased endurance, decreased mobility, decreased strength, increased fascial restrictions, increased muscle spasms, impaired tone, postural dysfunction, and pain.   ACTIVITY LIMITATIONS: continence  PARTICIPATION LIMITATIONS: community activity, occupation, and exercise  PERSONAL FACTORS: 1 comorbidity: medical history are also affecting patient's functional outcome.   REHAB POTENTIAL: Good  CLINICAL DECISION MAKING: Stable/uncomplicated  EVALUATION COMPLEXITY: Low   GOALS: Goals reviewed with patient? Yes  SHORT TERM GOALS: Target date: 04/12/2023 - updated 04/12/23    Pt will be independent with HEP.   Baseline: Goal status: MET 04/12/23  2.  Pt will be independent with use of squatty potty, relaxed toileting mechanics, and improved bowel movement techniques in order to increase ease of bowel movements and complete evacuation.   Baseline:  Goal status: MET 04/12/23  3.  Pt will perform daily self bowel mobilization in order to improve frequency and complete evacuation.  Baseline:  Goal status: Met 04/12/23    LONG  TERM GOALS: Updated 10/05/23  Pt will be independent with advanced HEP.   Baseline:  Goal status: IN PROGRESS 10/05/23  2.  Pt will demonstrate improved mobility in abdominal scar tissue. Baseline:  Goal status: IN PROGRESS  10/05/23  3.  Pt will be able to perform curl-up test in supine without abdominal distortion. Baseline: still has abdominal distortion, but with cuing for better transversus abdominus contraction he was able to improve Goal status: IN PROGRESS 10/05/23  4.  Pt will be able to perform single leg stance bil without any pelvic drop in order to demonstrate improved functional core strength.  Baseline:  Goal status: MET 08/14/23  5.  Pt will report 75% improvement in fecal urgency. Baseline: he's currently at 40% improvement Goal status: IN PROGRESS 10/05/23  6.  Pt will report no episodes of fecal incontinence.  Baseline: in the last week, he has had 2 episodes of fecal incontinence Goal status: IN PROGRESS 10/05/23   PLAN:  PT FREQUENCY: 1-2x/week  PT DURATION: 6 months  PLANNED INTERVENTIONS: 97110-Therapeutic exercises, 97530- Therapeutic activity, 97112- Neuromuscular re-education, 97535- Self Care, [id number]- Manual therapy, Dry Needling, and Biofeedback  PLAN FOR NEXT SESSION: Continue manual techniques to pelvic scar tissue and low back; progress core training; progress mobility activities.    Josette Mares, PT, DPT09/10/259:24 AM

## 2023-11-07 NOTE — Progress Notes (Unsigned)
 Edward [name] [address] [place] [address] Phone: [phone] Subjective:   Edward [name], am serving as a scribe for Dr. Arthea Claudene.  I'm seeing this patient by the request  of:  Rollene Almarie LABOR, MD  CC: Right wrist pain.  YEP:Dlagzrupcz  Edward [name] is a 48 y.o. male coming in with complaint of R wrist pain. Last seen in March for OMT. Patient states wrist has been pretty sore. Has to keep it wrapped 90% of the time. Low back pain over the weekend. Felt like it froze. Did some home therapy to help.       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 Social History   Socioeconomic History   Marital status: Married    Spouse name: Not on file   Number of children: Not on file   Years of education: Not on file   Highest education level: Master's degree (e.g., MA, MS, MEng, MEd, MSW, MBA)  Occupational History   Not on file  Tobacco Use   Smoking status: Former   Smokeless tobacco: Never  Vaping Use   Vaping status: Never Used  Substance and Sexual Activity   Alcohol use: Yes    Comment: glass of wine at noc   Drug use: Never   Sexual activity: Not on file  Other Topics Concern   Not on file  Social History Narrative   Not on file   Social Drivers of Health   Financial Resource Strain: Low Risk  (04/17/2023)   Overall Financial Resource Strain (CARDIA)    Difficulty of Paying Living Expenses: Not hard at all  Food Insecurity: No Food Insecurity (04/17/2023)   Hunger Vital Sign    Worried About Running Out of Food in the Last Year: Never true    Ran Out of Food in the Last Year: Never true  Transportation Needs: No Transportation Needs (04/17/2023)   PRAPARE - Administrator, Civil Service (Medical): No    Lack of  Transportation (Non-Medical): No  Physical Activity: Insufficiently Active (04/17/2023)   Exercise Vital Sign    Days of Exercise per Week: 2 days    Minutes of Exercise per Session: 30 min  Stress: No Stress Concern Present (04/17/2023)   [employer] of Occupational Health - Occupational Stress Questionnaire    Feeling of Stress : Only a little  Social Connections: Unknown (04/17/2023)   Social Connection and Isolation Panel    Frequency of Communication with Friends and Family: Once a week    Frequency of Social Gatherings with Friends and Family: Patient declined    Attends Religious Services: Never    Database administrator or Organizations: No    Attends Engineer, structural: Not on file    Marital Status: Married   No Known Allergies Family History  Problem Relation Age of Onset   Depression Mother    Asthma Father    Cancer Father    CAD Father    Hyperlipidemia Father    Hypertension Father    Colon cancer Neg Hx    Esophageal cancer Neg Hx    Rectal cancer Neg Hx    Stomach cancer Neg Hx  Current Outpatient Medications (Endocrine & Metabolic):    levothyroxine  (SYNTHROID ) 75 MCG tablet, Take 1 tablet (75 mcg total) by mouth daily before breakfast.   Current Outpatient Medications (Respiratory):    HYDROcodone  bit-homatropine (HYCODAN) 5-1.5 MG/5ML syrup, Take 5 mLs by mouth every 8 (eight) hours as needed for cough.   Current Outpatient Medications (Hematological):    vitamin B-12 (CYANOCOBALAMIN) 1000 MCG tablet, Take 1,000 mcg by mouth daily.  Current Outpatient Medications (Other):    cholecalciferol (VITAMIN D3) 25 MCG (1000 UT) tablet, Take 2,000 Units by mouth 2 (two) times a day.   diphenoxylate -atropine  (LOMOTIL ) 2.5-0.025 MG tablet, TAKE 1 TABLET BY MOUTH DAILY AS NEEDED FOR DIARRHEA OR LOOSE STOOLS   DULoxetine  (CYMBALTA ) 20 MG capsule, Take 1 capsule (20 mg total) by mouth daily. (Patient not taking: Reported on 07/05/2023)    hydrocortisone  (ANUSOL -HC) 2.5 % rectal cream, Place 1 Application rectally 2 (two) times daily.   OLANZapine (ZYPREXA) 2.5 MG tablet, Take 2.5 mg by mouth at bedtime.   oxybutynin  (DITROPAN ) 5 MG tablet, Take 1 tablet (5 mg total) by mouth at bedtime.   Reviewed prior external information including notes and imaging from  primary care provider As well as notes that were available from care everywhere and other healthcare systems.  Past medical history, social, surgical and family history all reviewed in electronic medical record.  No pertanent information unless stated regarding to the chief complaint.   Review of Systems:  No headache, visual changes, nausea, vomiting, diarrhea, constipation, dizziness, abdominal pain, skin rash, fevers, chills, night sweats, weight loss, swollen lymph nodes, body aches, joint swelling, chest pain, shortness of breath, mood changes. POSITIVE muscle aches  Objective  Blood pressure 116/78, pulse 74, height 5' 9 (1.753 m), weight 171 lb (77.6 kg), SpO2 96%.   General: No apparent distress alert and oriented x3 mood and affect normal, dressed appropriately.  HEENT: Pupils equal, extraocular movements intact  Respiratory: Patient's speak in full sentences and does not appear short of breath  Cardiovascular: No lower extremity edema, non tender, no erythema  Right wrist exam shows more tenderness over the dorsal aspect of the hand and the ECU at the moment.  Tightness noted with dorsi flexion. Low back does have some loss of lordosis noted.  Some tenderness to palpation in the paraspinal musculature.  Patient does on the neck as well have some limited extension of the neck noted.  Procedure: Real-time Ultrasound Guided Injection of right dorsal wrist cyst Device: GE Logiq Q7 Ultrasound guided injection is preferred based studies that show increased duration, increased effect, greater accuracy, decreased procedural pain, increased response rate, and decreased  cost with ultrasound guided versus blind injection.  Verbal informed consent obtained.  Time-out conducted.  Noted no overlying erythema, induration, or other signs of local infection.  Skin prepped in a sterile fashion.  Local anesthesia: Topical Ethyl chloride.  With sterile technique and under real time ultrasound guidance: With a 25-gauge half inch needle injected with 0.5 cc of 0.5% Marcaine and 0.5 cc of Kenalog  40 mg/mL Completed without difficulty  Pain immediately resolved suggesting accurate placement of the medication.  Advised to call if fevers/chills, erythema, induration, drainage, or persistent bleeding.  Impression: Technically successful ultrasound guided injection.   Impression and Recommendations:    The above documentation has been reviewed and is accurate and complete Lindy Garczynski M Lucine Bilski, DO .

## 2023-11-08 ENCOUNTER — Other Ambulatory Visit: Payer: Self-pay

## 2023-11-08 ENCOUNTER — Ambulatory Visit: Admitting: Family Medicine

## 2023-11-08 ENCOUNTER — Encounter: Payer: Self-pay | Admitting: Family Medicine

## 2023-11-08 VITALS — BP 116/78 | HR 74 | Ht 69.0 in | Wt 171.0 lb

## 2023-11-08 DIAGNOSIS — M542 Cervicalgia: Secondary | ICD-10-CM

## 2023-11-08 DIAGNOSIS — M25531 Pain in right wrist: Secondary | ICD-10-CM | POA: Diagnosis not present

## 2023-11-08 DIAGNOSIS — M5416 Radiculopathy, lumbar region: Secondary | ICD-10-CM | POA: Diagnosis not present

## 2023-11-08 DIAGNOSIS — M5030 Other cervical disc degeneration, unspecified cervical region: Secondary | ICD-10-CM

## 2023-11-08 DIAGNOSIS — S6981XA Other specified injuries of right wrist, hand and finger(s), initial encounter: Secondary | ICD-10-CM | POA: Diagnosis not present

## 2023-11-08 MED ORDER — KETOROLAC TROMETHAMINE 60 MG/2ML IM SOLN
60.0000 mg | Freq: Once | INTRAMUSCULAR | Status: AC
  Administered 2023-11-08: 60 mg via INTRAMUSCULAR

## 2023-11-08 MED ORDER — METHYLPREDNISOLONE ACETATE 80 MG/ML IJ SUSP
80.0000 mg | Freq: Once | INTRAMUSCULAR | Status: AC
  Administered 2023-11-08: 80 mg via INTRAMUSCULAR

## 2023-11-08 NOTE — Patient Instructions (Addendum)
 [facility] [phone] for epidurals Good to see you!  Injection in wrist today Full cocktail in backside today See you again in 6-8 weeks

## 2023-11-08 NOTE — Assessment & Plan Note (Signed)
 See how patient responds, given injection today, discussed icing regimen, discussed proper taping, increase activity slowly.  Discussed icing regimen.  Follow-up again in 6 to 8 weeks

## 2023-11-08 NOTE — Assessment & Plan Note (Signed)
 Chronic problem with worsening symptoms and some radicular symptoms.  Do think he would respond to another epidural.  Toradol  and Depo-Medrol  injection was given today.

## 2023-11-23 NOTE — Discharge Instructions (Signed)

## 2023-11-24 ENCOUNTER — Inpatient Hospital Stay
Admission: RE | Admit: 2023-11-24 | Discharge: 2023-11-24 | Disposition: A | Discharge status: Home / Self Care | Source: Ambulatory Visit | Attending: Family Medicine | Admitting: Family Medicine

## 2023-11-29 NOTE — Discharge Instructions (Signed)

## 2023-11-30 ENCOUNTER — Ambulatory Visit: Attending: Internal Medicine

## 2023-11-30 DIAGNOSIS — M62838 Other muscle spasm: Secondary | ICD-10-CM | POA: Diagnosis present

## 2023-11-30 DIAGNOSIS — M5459 Other low back pain: Secondary | ICD-10-CM | POA: Diagnosis present

## 2023-11-30 DIAGNOSIS — R293 Abnormal posture: Secondary | ICD-10-CM | POA: Diagnosis present

## 2023-11-30 DIAGNOSIS — M6281 Muscle weakness (generalized): Secondary | ICD-10-CM | POA: Insufficient documentation

## 2023-11-30 DIAGNOSIS — R279 Unspecified lack of coordination: Secondary | ICD-10-CM | POA: Diagnosis present

## 2023-11-30 NOTE — Therapy (Signed)
 OUTPATIENT PHYSICAL THERAPY MALE PELVIC TREATMENT   Patient Name: Edward [name] MRN: [id number] DOB:11-28-1975, 48 y.o., male Today's Date: 11/30/2023  END OF SESSION:  PT End of Session - 11/30/23 0849     Visit Number 17    Date for Recertification  01/30/24    Authorization Type [employer]    Authorization Time Period signed dry needling consent 10/19/23    PT Start Time 0845    PT Stop Time 0926    PT Time Calculation (min) 41 min    Activity Tolerance Patient tolerated treatment well    Behavior During Therapy [facility] for tasks assessed/performed             Past Medical History:  Diagnosis Date   Arthritis    OA right knee   Family history of prostate cancer    Kidney stones    has a horse shoe kidney   Rectal cancer (HCC)    Thyroid  disease    History reviewed. No pertinent surgical history. Patient Active Problem List   Diagnosis Date Noted   TFCC (triangular fibrocartilage complex) injury, right, initial encounter 11/08/2023   Snoring 01/20/2023   Degenerative disc disease, cervical 01/02/2023   Chronic SI joint pain 01/02/2023   Midline back pain 06/08/2022   Right carpal tunnel syndrome 04/25/2022   Adjustment disorder 11/04/2021   Skin mole 08/11/2021   Extensor carpi ulnaris tendinitis 06/01/2021   Routine general medical examination at a health care facility 01/05/2021   Genetic testing 12/28/2020   [facility] joint pain 11/24/2020   Rectal cancer (HCC) 09/22/2020   Insomnia 09/22/2020   Hypothyroid 09/22/2020   Nonallopathic lesion of thoracic region 11/25/2018   Nonallopathic lesion of sacral region 11/25/2018   Nonallopathic lesion of lumbosacral region 11/25/2018   Nonallopathic lesion of rib cage 11/25/2018   Nonallopathic lesion of cervical region 11/25/2018   Trigger point of left shoulder region 08/29/2018   Patellofemoral arthralgia of right knee 07/25/2018   Scapular dyskinesis 07/25/2018    PCP: Rollene Almarie LABOR, MD  REFERRING PROVIDER:  Rollene Almarie LABOR, MD  REFERRING DIAG: C20 (ICD-10-CM) - Rectal cancer [facility])  THERAPY DIAG:  Muscle weakness (generalized)  Other muscle spasm  Unspecified lack of coordination  Other low back pain  Rationale for Evaluation and Treatment: Rehabilitation  ONSET DATE: 2022  SUBJECTIVE:                                                                                                                                                                                           SUBJECTIVE STATEMENT: Pt states that he has had a very difficult time with bowel movements  and incontinence the last two weeks. He is having much worse low back pain and goes for an epidural tomorrow. He has been having tingling in bil feet.   PAIN: 11/30/23 Are you having pain? Yes NPRS scale: 7/10 Pain location: low back  Pain type: aching and tight Pain description: constant   Aggravating factors: straining, sleeping positions, prolonged standing - coming up from bending Relieving factors: rest, avoiding straining  PRECAUTIONS: Other: rectal cancer 2022 diagnosis with surgery x2 and chemo  RED FLAGS: None   WEIGHT BEARING RESTRICTIONS: No  FALLS:  Has patient fallen in last 6 months? 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, mild disc/facet degeneration in mid-lower lumbar spine  BOWEL MOVEMENT: Pain with bowel movement: No Type of bowel movement:Type (Bristol Stool Scale) 1-7, Frequency random, and Strain Yes Fully empty rectum: No Leakage: Yes: improved with immodium and ondanestron - 3 or 4 accidents since reversal  Pads: No Fiber supplement: Yes: tried metamucil in the past  URINATION: Pain with urination: No Fully empty bladder:  Yes: - Stream: Strong Urgency: No Frequency: 1-2x/night; few times a day  Leakage: none Pads: No  INTERCOURSE: Pain with intercourse: none Climax: WNL Ejaculation: Yes: -   OBJECTIVE:  Note: Objective measures were completed at Evaluation unless otherwise noted. 03/22/23:             External Perineal Exam deferred to next session              Internal Pelvic Floor Significant restriction and scar tissue with tenderness  Patient confirms identification and approves PT to assess internal pelvic floor and treatment Yes   PELVIC MMT:    MMT eval  Internal Anal Sphincter 1/5  External Anal Sphincter 2/5 with multimodal cues to help isolate contraction  Puborectalis 1/5  Diastasis Recti 3 finger width diastasis with distortion  (Blank rows = not tested)  TONE: deferred to next session 03/15/23:  COGNITION: Overall cognitive status: Within functional limits for tasks assessed     SENSATION: Light touch: Appears intact Proprioception: Appears intact   FUNCTIONAL TESTS:  Squat: WNL, some Rt knee pain Single leg stance:  Rt: Lt pelvic drop  Lt:Rt pelvic drop Curl-up test: 3 finger width diastasis with distortion inferior/superior to umbilicus Sit-up test: 2/3  GAIT: Comments: WNL  POSTURE: rounded shoulders, forward head, decreased lumbar lordosis, decreased thoracic kyphosis, and posterior pelvic tilt  PELVIC ALIGNMENT: Rt posterior rotation  LUMBARAROM/PROM:  A/PROM A/PROM  Eval (% available)  Flexion 75  Extension 50  Right lateral flexion 50  Left lateral flexion 50  Right rotation 50  Left rotation 75   (Blank rows = not tested)   PALPATION: GENERAL significant scar tissue restriction in abdomen, Rt>Lt; tenderness in lower abdomen; rib cage restriction, lumbar paraspinal tone, Rt>Lt; tenderness at midline throughout lumbar and lower thoracic spine; decreased lumbar spine mobility with PA mobilizations     TODAY'S TREATMENT:  DATE:  11/30/23 Manual: Prone: Soft tissue mobilization to lumbar paraspinals Soft tissue mobilization to sacral paraspinals Negative pressure soft tissue mobilization to lumbar paraspinals, obliques, and thoracolumbar fascia Myofascial release to thoracolumbar fascia  Therapeutic activities: Continued to discuss fiber and being regular in order to help improve consistency of bowel movements   10/25/23 Manual: Supine abdominal scar tissue mobilization Supine bowel mobiliation Supine abdominal soft tissue mobilization  Neuromuscular re-education: Supine elevated LE lower trunk rotation + transversus abdominus 2 x 10 Supine full shoulder flexion + transversus abdominus + 10lbs 2 x 10 Bridge with unilateral chest press + 10lbs  Supine full shoulder flexion + single LE extension + 10 lbs 2 x 10 bil Wall/ball plank + march 2 x 10 Exercises: Ball up wall (yellow) 10x   10/19/23 Trigger Point Dry Needling  Subsequent Treatment: Instructions provided previously at initial dry needling treatment.  Instructions reviewed, if requested by the patient, prior to subsequent dry needling treatment.   Patient Verbal Consent Given: Yes Education Handout Provided: Yes Muscles Treated: L4-S1 multifidi Electrical Stimulation Performed: No Treatment Response/Outcome: palpable improvement in soft tissue restriction   Manual: Prone myofascial release to low back Soft tissue mobilization to lumbar paraspinals and glutes Prone posterior/anterior mobilization to lumbar spine and sacral base grade 2-3 Prone negative pressure soft tissue mobilization     PATIENT EDUCATION:  Education details: See above Person educated: Patient Education method: Programmer, multimedia, Demonstration, Actor cues, Verbal cues, and Handouts Education comprehension: verbalized understanding  HOME EXERCISE  PROGRAM: [id number]  ASSESSMENT:  CLINICAL IMPRESSION: Pt having very difficult time with bowel movements, leaking, and low back pain exacerbation. He is waiting to get epidural tomorrow to help with low back pain. He would like to focus on low back pain this session. He responded well to all manual techniques, but does demonstrate notable restriction in muscles in this area. Some improvement in restriction to sacral paraspinals, but low back muscles remained very restricted. Believe his abdominal scar tissue is preventing appropriate low back and pelvic support and if scar tissue could improve he would see improvement in low back pain as well. We also discussed being regular with fiber supplement in order to possibly see better consistency in bowel movements. He will continue to benefit from skilled PT intervention in order to decrease bowel dysfunction, improve abdominal pressure management, and begin/progress functional strengthening program.   OBJECTIVE IMPAIRMENTS: decreased activity tolerance, decreased coordination, decreased endurance, decreased mobility, decreased strength, increased fascial restrictions, increased muscle spasms, impaired tone, postural dysfunction, and pain.   ACTIVITY LIMITATIONS: continence  PARTICIPATION LIMITATIONS: community activity, occupation, and exercise  PERSONAL FACTORS: 1 comorbidity: medical history are also affecting patient's functional outcome.   REHAB POTENTIAL: Good  CLINICAL DECISION MAKING: Stable/uncomplicated  EVALUATION COMPLEXITY: Low   GOALS: Goals reviewed with patient? Yes  SHORT TERM GOALS: Target date: 04/12/2023 - updated 04/12/23    Pt will be independent with HEP.   Baseline: Goal status: MET 04/12/23  2.  Pt will be independent with use of squatty potty, relaxed toileting mechanics, and improved bowel movement techniques in order to increase ease of bowel movements and complete evacuation.   Baseline:  Goal status: MET  04/12/23  3.  Pt will perform daily self bowel mobilization in order to improve frequency and complete evacuation.  Baseline:  Goal status: Met 04/12/23    LONG TERM GOALS: Updated 11/30/23  Pt will be independent with advanced HEP.   Baseline:  Goal status: IN PROGRESS 11/30/23  2.  Pt  will demonstrate improved mobility in abdominal scar tissue. Baseline:  Goal status: IN PROGRESS 11/30/23  3.  Pt will be able to perform curl-up test in supine without abdominal distortion. Baseline: still has abdominal distortion, but with cuing for better transversus abdominus contraction he was able to improve Goal status: IN PROGRESS 11/30/23  4.  Pt will be able to perform single leg stance bil without any pelvic drop in order to demonstrate improved functional core strength.  Baseline:  Goal status: MET 08/14/23  5.  Pt will report 75% improvement in fecal urgency. Baseline: he's currently at 40% improvement Goal status: IN PROGRESS 11/30/23  6.  Pt will report no episodes of fecal incontinence.  Baseline: in the last week, he has had 2 episodes of fecal incontinence Goal status: IN PROGRESS 11/30/23   PLAN:  PT FREQUENCY: 1-2x/week  PT DURATION: 6 months  PLANNED INTERVENTIONS: 97110-Therapeutic exercises, 97530- Therapeutic activity, 97112- Neuromuscular re-education, 97535- Self Care, [id number]- Manual therapy, Dry Needling, and Biofeedback  PLAN FOR NEXT SESSION: Continue manual techniques to pelvic scar tissue and low back; progress core training; progress mobility activities.    Josette Mares, PT, DPT10/16/258:50 AM

## 2023-12-01 ENCOUNTER — Ambulatory Visit
Admission: RE | Admit: 2023-12-01 | Discharge: 2023-12-01 | Disposition: A | Discharge status: Home / Self Care | Source: Ambulatory Visit | Attending: Family Medicine | Admitting: Family Medicine

## 2023-12-01 DIAGNOSIS — M5416 Radiculopathy, lumbar region: Secondary | ICD-10-CM

## 2023-12-01 MED ORDER — IOPAMIDOL (ISOVUE-M 200) INJECTION 41%
1.0000 mL | Freq: Once | INTRAMUSCULAR | Status: AC
  Administered 2023-12-01: 1 mL via EPIDURAL

## 2023-12-01 MED ORDER — METHYLPREDNISOLONE ACETATE 40 MG/ML INJ SUSP (RADIOLOG
80.0000 mg | Freq: Once | INTRAMUSCULAR | Status: AC
  Administered 2023-12-01: 80 mg via EPIDURAL

## 2023-12-07 ENCOUNTER — Other Ambulatory Visit: Payer: Self-pay

## 2023-12-07 ENCOUNTER — Encounter: Payer: Self-pay | Admitting: Internal Medicine

## 2023-12-07 MED ORDER — OXYBUTYNIN CHLORIDE 5 MG PO TABS: 5.0000 mg | ORAL_TABLET | Freq: Every day | ORAL | 1 refills | Status: DC

## 2023-12-07 NOTE — Telephone Encounter (Signed)
 I have sent in refill as patient has been seen recently

## 2023-12-13 ENCOUNTER — Ambulatory Visit

## 2023-12-13 DIAGNOSIS — R293 Abnormal posture: Secondary | ICD-10-CM

## 2023-12-13 DIAGNOSIS — M62838 Other muscle spasm: Secondary | ICD-10-CM

## 2023-12-13 DIAGNOSIS — M6281 Muscle weakness (generalized): Secondary | ICD-10-CM | POA: Diagnosis not present

## 2023-12-13 DIAGNOSIS — M5459 Other low back pain: Secondary | ICD-10-CM

## 2023-12-13 DIAGNOSIS — R279 Unspecified lack of coordination: Secondary | ICD-10-CM

## 2023-12-13 NOTE — Therapy (Signed)
 OUTPATIENT PHYSICAL THERAPY MALE PELVIC TREATMENT   Patient Name: Edward [name] MRN: [id number] DOB:[birth year], 48 y.o., male Today's Date: 12/13/2023  END OF SESSION:  PT End of Session - 12/13/23 0850     Visit Number 18    Date for Recertification  01/30/24    Authorization Type [employer]    Authorization Time Period signed dry needling consent 12/13/23    PT Start Time 0847    PT Stop Time 0928    PT Time Calculation (min) 41 min    Activity Tolerance Patient tolerated treatment well    Behavior During Therapy [facility] for tasks assessed/performed             Past Medical History:  Diagnosis Date   Arthritis    OA right knee   Family history of prostate cancer    Kidney stones    has a horse shoe kidney   Rectal cancer (HCC)    Thyroid  disease    History reviewed. No pertinent surgical history. Patient Active Problem List   Diagnosis Date Noted   TFCC (triangular fibrocartilage complex) injury, right, initial encounter 11/08/2023   Snoring 01/20/2023   Degenerative disc disease, cervical 01/02/2023   Chronic SI joint pain 01/02/2023   Midline back pain 06/08/2022   Right carpal tunnel syndrome 04/25/2022   Adjustment disorder 11/04/2021   Skin mole 08/11/2021   Extensor carpi ulnaris tendinitis 06/01/2021   Routine general medical examination at a health care facility 01/05/2021   Genetic testing 12/28/2020   [facility] joint pain 11/24/2020   Rectal cancer (HCC) 09/22/2020   Insomnia 09/22/2020   Hypothyroid 09/22/2020   Nonallopathic lesion of thoracic region 11/25/2018   Nonallopathic lesion of sacral region 11/25/2018   Nonallopathic lesion of lumbosacral region 11/25/2018   Nonallopathic lesion of rib cage 11/25/2018   Nonallopathic lesion of cervical region 11/25/2018   Trigger point of left shoulder region 08/29/2018   Patellofemoral arthralgia of right knee 07/25/2018   Scapular dyskinesis 07/25/2018    PCP: Rollene Almarie LABOR, MD  REFERRING PROVIDER:  Rollene Almarie LABOR, MD  REFERRING DIAG: C20 (ICD-10-CM) - Rectal cancer [facility])  THERAPY DIAG:  Muscle weakness (generalized)  Other muscle spasm  Unspecified lack of coordination  Other low back pain  Abnormal posture  Rationale for Evaluation and Treatment: Rehabilitation  ONSET DATE: 2022  SUBJECTIVE:                                                                                                                                                                                           SUBJECTIVE STATEMENT: Pt is doing much better this week.  He has  not had any accidents since last visit. He had epidural and is having improvement in low back pain.   PAIN: 12/12/23 Are you having pain? Yes NPRS scale: 5/10 Pain location: low back  Pain type: aching and tight Pain description: constant   Aggravating factors: straining, sleeping positions, prolonged standing - coming up from bending Relieving factors: rest, avoiding straining  PRECAUTIONS: Other: rectal cancer 2022 diagnosis with surgery x2 and chemo  RED FLAGS: None   WEIGHT BEARING RESTRICTIONS: No  FALLS:  Has patient fallen in last 6 months? 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, mild disc/facet degeneration in mid-lower lumbar spine  BOWEL MOVEMENT: Pain with bowel movement: No Type of bowel movement:Type (Bristol Stool Scale) 1-7, Frequency random, and Strain Yes Fully empty rectum: No Leakage: Yes: improved with immodium and ondanestron - 3 or 4 accidents since reversal  Pads: No Fiber supplement: Yes: tried metamucil in the past  URINATION: Pain with urination: No Fully empty bladder: Yes: - Stream: Strong Urgency: No Frequency: 1-2x/night;  few times a day  Leakage: none Pads: No  INTERCOURSE: Pain with intercourse: none Climax: WNL Ejaculation: Yes: -   OBJECTIVE:  Note: Objective measures were completed at Evaluation unless otherwise noted. 03/22/23:             External Perineal Exam deferred to next session              Internal Pelvic Floor Significant restriction and scar tissue with tenderness  Patient confirms identification and approves PT to assess internal pelvic floor and treatment Yes   PELVIC MMT:    MMT eval  Internal Anal Sphincter 1/5  External Anal Sphincter 2/5 with multimodal cues to help isolate contraction  Puborectalis 1/5  Diastasis Recti 3 finger width diastasis with distortion  (Blank rows = not tested)  TONE: deferred to next session 03/15/23:  COGNITION: Overall cognitive status: Within functional limits for tasks assessed     SENSATION: Light touch: Appears intact Proprioception: Appears intact   FUNCTIONAL TESTS:  Squat: WNL, some Rt knee pain Single leg stance:  Rt: Lt pelvic drop  Lt:Rt pelvic drop Curl-up test: 3 finger width diastasis with distortion inferior/superior to umbilicus Sit-up test: 2/3  GAIT: Comments: WNL  POSTURE: rounded shoulders, forward head, decreased lumbar lordosis, decreased thoracic kyphosis, and posterior pelvic tilt  PELVIC ALIGNMENT: Rt posterior rotation  LUMBARAROM/PROM:  A/PROM A/PROM  Eval (% available)  Flexion 75  Extension 50  Right lateral flexion 50  Left lateral flexion 50  Right rotation 50  Left rotation 75   (Blank rows = not tested)   PALPATION: GENERAL significant scar tissue restriction in abdomen, Rt>Lt; tenderness in lower abdomen; rib cage restriction, lumbar paraspinal tone, Rt>Lt; tenderness at midline throughout lumbar and lower thoracic spine; decreased lumbar spine mobility with PA mobilizations     TODAY'S TREATMENT:  DATE:  12/13/23 Trigger Point Dry Needling  Subsequent Treatment: Instructions provided previously at initial dry needling treatment.  Instructions reviewed, if requested by the patient, prior to subsequent dry needling treatment.   Patient Verbal Consent Given: Yes Education Handout Provided: Previously Provided Muscles Treated: L4-5 multifidi, bil glutes Electrical Stimulation Performed: No Treatment Response/Outcome: twitch response and release  Manual: Prone: Soft tissue mobilization to lumbar paraspinals Soft tissue mobilization to sacral paraspinals Negative pressure soft tissue mobilization to lumbar paraspinals, obliques, and thoracolumbar fascia Myofascial release to thoracolumbar fascia    11/30/23 Manual: Prone: Soft tissue mobilization to lumbar paraspinals Soft tissue mobilization to sacral paraspinals Negative pressure soft tissue mobilization to lumbar paraspinals, obliques, and thoracolumbar fascia Myofascial release to thoracolumbar fascia  Therapeutic activities: Continued to discuss fiber and being regular in order to help improve consistency of bowel movements   10/25/23 Manual: Supine abdominal scar tissue mobilization Supine bowel mobiliation Supine abdominal soft tissue mobilization  Neuromuscular re-education: Supine elevated LE lower trunk rotation + transversus abdominus 2 x 10 Supine full shoulder flexion + transversus abdominus + 10lbs 2 x 10 Bridge with unilateral chest press + 10lbs  Supine full shoulder flexion + single LE extension + 10 lbs 2 x 10 bil Wall/ball plank + march 2 x 10 Exercises: Ball up wall (yellow) 10x    PATIENT EDUCATION:  Education details: See above Person educated: Patient Education method: Programmer, Multimedia, Demonstration, Tactile cues, Verbal cues, and Handouts Education comprehension: verbalized understanding  HOME EXERCISE PROGRAM: [id number]  ASSESSMENT:  CLINICAL  IMPRESSION: Pt having very difficult time with bowel movements, leaking, and low back pain exacerbation. He is waiting to get epidural tomorrow to help with low back pain. Pt doing better, but still having a lot of low back pain. Dry needling performed with significant twitch response, Lt glutes greater than Rt.  He will continue to benefit from skilled PT intervention in order to decrease bowel dysfunction, improve abdominal pressure management, and begin/progress functional strengthening program.   OBJECTIVE IMPAIRMENTS: decreased activity tolerance, decreased coordination, decreased endurance, decreased mobility, decreased strength, increased fascial restrictions, increased muscle spasms, impaired tone, postural dysfunction, and pain.   ACTIVITY LIMITATIONS: continence  PARTICIPATION LIMITATIONS: community activity, occupation, and exercise  PERSONAL FACTORS: 1 comorbidity: medical history are also affecting patient's functional outcome.   REHAB POTENTIAL: Good  CLINICAL DECISION MAKING: Stable/uncomplicated  EVALUATION COMPLEXITY: Low   GOALS: Goals reviewed with patient? Yes  SHORT TERM GOALS: Target date: 04/12/2023 - updated 04/12/23    Pt will be independent with HEP.   Baseline: Goal status: MET 04/12/23  2.  Pt will be independent with use of squatty potty, relaxed toileting mechanics, and improved bowel movement techniques in order to increase ease of bowel movements and complete evacuation.   Baseline:  Goal status: MET 04/12/23  3.  Pt will perform daily self bowel mobilization in order to improve frequency and complete evacuation.  Baseline:  Goal status: Met 04/12/23    LONG TERM GOALS: Updated 11/30/23  Pt will be independent with advanced HEP.   Baseline:  Goal status: IN PROGRESS 11/30/23  2.  Pt will demonstrate improved mobility in abdominal scar tissue. Baseline:  Goal status: IN PROGRESS 11/30/23  3.  Pt will be able to perform curl-up test in  supine without abdominal distortion. Baseline: still has abdominal distortion, but with cuing for better transversus abdominus contraction he was able to improve Goal status: IN PROGRESS 11/30/23  4.  Pt will be able to perform single leg  stance bil without any pelvic drop in order to demonstrate improved functional core strength.  Baseline:  Goal status: MET 08/14/23  5.  Pt will report 75% improvement in fecal urgency. Baseline: he's currently at 40% improvement Goal status: IN PROGRESS 11/30/23  6.  Pt will report no episodes of fecal incontinence.  Baseline: in the last week, he has had 2 episodes of fecal incontinence Goal status: IN PROGRESS 11/30/23   PLAN:  PT FREQUENCY: 1-2x/week  PT DURATION: 6 months  PLANNED INTERVENTIONS: 97110-Therapeutic exercises, 97530- Therapeutic activity, 97112- Neuromuscular re-education, 97535- Self Care, [id number]- Manual therapy, Dry Needling, and Biofeedback  PLAN FOR NEXT SESSION: Continue manual techniques to pelvic scar tissue and low back; progress core training; progress mobility activities.    Josette Mares, PT, DPT10/29/258:52 AM

## 2023-12-15 ENCOUNTER — Other Ambulatory Visit

## 2023-12-18 ENCOUNTER — Ambulatory Visit: Attending: Internal Medicine

## 2023-12-18 DIAGNOSIS — M6281 Muscle weakness (generalized): Secondary | ICD-10-CM | POA: Insufficient documentation

## 2023-12-18 DIAGNOSIS — M62838 Other muscle spasm: Secondary | ICD-10-CM | POA: Diagnosis present

## 2023-12-18 DIAGNOSIS — R293 Abnormal posture: Secondary | ICD-10-CM | POA: Insufficient documentation

## 2023-12-18 DIAGNOSIS — M5459 Other low back pain: Secondary | ICD-10-CM | POA: Diagnosis present

## 2023-12-18 DIAGNOSIS — R279 Unspecified lack of coordination: Secondary | ICD-10-CM | POA: Insufficient documentation

## 2023-12-18 NOTE — Therapy (Signed)
 OUTPATIENT PHYSICAL THERAPY MALE PELVIC TREATMENT   Patient Name: Edward [name] MRN: [id number] DOB:[birth year], 48 y.o., male Today's Date: 12/18/2023  END OF SESSION:  PT End of Session - 12/18/23 0847     Visit Number 19    Date for Recertification  01/30/24    Authorization Type [employer]    Authorization Time Period signed dry needling consent 12/13/23    PT Start Time 0847    PT Stop Time 0927    PT Time Calculation (min) 40 min    Activity Tolerance Patient tolerated treatment well    Behavior During Therapy [facility] for tasks assessed/performed             Past Medical History:  Diagnosis Date   Arthritis    OA right knee   Family history of prostate cancer    Kidney stones    has a horse shoe kidney   Rectal cancer (HCC)    Thyroid  disease    History reviewed. No pertinent surgical history. Patient Active Problem List   Diagnosis Date Noted   TFCC (triangular fibrocartilage complex) injury, right, initial encounter 11/08/2023   Snoring 01/20/2023   Degenerative disc disease, cervical 01/02/2023   Chronic SI joint pain 01/02/2023   Midline back pain 06/08/2022   Right carpal tunnel syndrome 04/25/2022   Adjustment disorder 11/04/2021   Skin mole 08/11/2021   Extensor carpi ulnaris tendinitis 06/01/2021   Routine general medical examination at a health care facility 01/05/2021   Genetic testing 12/28/2020   [facility] joint pain 11/24/2020   Rectal cancer (HCC) 09/22/2020   Insomnia 09/22/2020   Hypothyroid 09/22/2020   Nonallopathic lesion of thoracic region 11/25/2018   Nonallopathic lesion of sacral region 11/25/2018   Nonallopathic lesion of lumbosacral region 11/25/2018   Nonallopathic lesion of rib cage 11/25/2018   Nonallopathic lesion of cervical region 11/25/2018   Trigger point of left shoulder region 08/29/2018   Patellofemoral arthralgia of right knee 07/25/2018   Scapular dyskinesis 07/25/2018    PCP: Rollene Almarie LABOR, MD  REFERRING PROVIDER:  Rollene Almarie LABOR, MD  REFERRING DIAG: C20 (ICD-10-CM) - Rectal cancer [facility])  THERAPY DIAG:  Muscle weakness (generalized)  Other muscle spasm  Unspecified lack of coordination  Other low back pain  Abnormal posture  Rationale for Evaluation and Treatment: Rehabilitation  ONSET DATE: 2022  SUBJECTIVE:                                                                                                                                                                                           SUBJECTIVE STATEMENT: Pt states that his low back is improving, but still  sore. He would like to focus on scar tissue mobilization and core work.   PAIN: 12/18/23 Are you having pain? Yes NPRS scale: 5/10 Pain location: low back  Pain type: aching and tight Pain description: constant   Aggravating factors: straining, sleeping positions, prolonged standing - coming up from bending Relieving factors: rest, avoiding straining  PRECAUTIONS: Other: rectal cancer 2022 diagnosis with surgery x2 and chemo  RED FLAGS: None   WEIGHT BEARING RESTRICTIONS: No  FALLS:  Has patient fallen in last 6 months? 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, mild disc/facet degeneration in mid-lower lumbar spine  BOWEL MOVEMENT: Pain with bowel movement: No Type of bowel movement:Type (Bristol Stool Scale) 1-7, Frequency random, and Strain Yes Fully empty rectum: No Leakage: Yes: improved with immodium and ondanestron - 3 or 4 accidents since reversal  Pads: No Fiber supplement: Yes: tried metamucil in the past  URINATION: Pain with urination: No Fully empty bladder: Yes: - Stream: Strong Urgency: No Frequency: 1-2x/night; few times a day   Leakage: none Pads: No  INTERCOURSE: Pain with intercourse: none Climax: WNL Ejaculation: Yes: -   OBJECTIVE:  Note: Objective measures were completed at Evaluation unless otherwise noted. 03/22/23:             External Perineal Exam deferred to next session              Internal Pelvic Floor Significant restriction and scar tissue with tenderness  Patient confirms identification and approves PT to assess internal pelvic floor and treatment Yes   PELVIC MMT:    MMT eval  Internal Anal Sphincter 1/5  External Anal Sphincter 2/5 with multimodal cues to help isolate contraction  Puborectalis 1/5  Diastasis Recti 3 finger width diastasis with distortion  (Blank rows = not tested)  TONE: deferred to next session 03/15/23:  COGNITION: Overall cognitive status: Within functional limits for tasks assessed     SENSATION: Light touch: Appears intact Proprioception: Appears intact   FUNCTIONAL TESTS:  Squat: WNL, some Rt knee pain Single leg stance:  Rt: Lt pelvic drop  Lt:Rt pelvic drop Curl-up test: 3 finger width diastasis with distortion inferior/superior to umbilicus Sit-up test: 2/3  GAIT: Comments: WNL  POSTURE: rounded shoulders, forward head, decreased lumbar lordosis, decreased thoracic kyphosis, and posterior pelvic tilt  PELVIC ALIGNMENT: Rt posterior rotation  LUMBARAROM/PROM:  A/PROM A/PROM  Eval (% available)  Flexion 75  Extension 50  Right lateral flexion 50  Left lateral flexion 50  Right rotation 50  Left rotation 75   (Blank rows = not tested)   PALPATION: GENERAL significant scar tissue restriction in abdomen, Rt>Lt; tenderness in lower abdomen; rib cage restriction, lumbar paraspinal tone, Rt>Lt; tenderness at midline throughout lumbar and lower thoracic spine; decreased lumbar spine mobility with PA mobilizations     TODAY'S TREATMENT:  DATE:  12/18/23 Manual: Pt provides verbal consent for internal rectal pelvic floor exam. Internal rectal pelvic floor muscle release in Lt side lying  Supine myofascial release to lower midline abdomen with constant pressure Supine abdominal scar tissue mobilization   12/13/23 Trigger Point Dry Needling  Subsequent Treatment: Instructions provided previously at initial dry needling treatment.  Instructions reviewed, if requested by the patient, prior to subsequent dry needling treatment.   Patient Verbal Consent Given: Yes Education Handout Provided: Previously Provided Muscles Treated: L4-5 multifidi, bil glutes Electrical Stimulation Performed: No Treatment Response/Outcome: twitch response and release  Manual: Prone: Soft tissue mobilization to lumbar paraspinals Soft tissue mobilization to sacral paraspinals Negative pressure soft tissue mobilization to lumbar paraspinals, obliques, and thoracolumbar fascia Myofascial release to thoracolumbar fascia    11/30/23 Manual: Prone: Soft tissue mobilization to lumbar paraspinals Soft tissue mobilization to sacral paraspinals Negative pressure soft tissue mobilization to lumbar paraspinals, obliques, and thoracolumbar fascia Myofascial release to thoracolumbar fascia  Therapeutic activities: Continued to discuss fiber and being regular in order to help improve consistency of bowel movements  PATIENT EDUCATION:  Education details: See above Person educated: Patient Education method: Explanation, Demonstration, Tactile cues, Verbal cues, and Handouts Education comprehension: verbalized understanding  HOME EXERCISE PROGRAM: [id number]  ASSESSMENT:  CLINICAL IMPRESSION: Pt still having low back pain, but it is getting better. Due to report of premature ejaculation that started around the same time as increase in low back pain, believe this could potentially be due to increased pelvic floor muscle  restriction. He did demonstrate large increase in tone rectally in pelvic floor muscles that were very painful. We also addressed abdominal scar tissue restriction. He was encouraged to perform penile and testicular traction to help mobilize this area. He will continue to benefit from skilled PT intervention in order to decrease bowel dysfunction, improve abdominal pressure management, and begin/progress functional strengthening program.   OBJECTIVE IMPAIRMENTS: decreased activity tolerance, decreased coordination, decreased endurance, decreased mobility, decreased strength, increased fascial restrictions, increased muscle spasms, impaired tone, postural dysfunction, and pain.   ACTIVITY LIMITATIONS: continence  PARTICIPATION LIMITATIONS: community activity, occupation, and exercise  PERSONAL FACTORS: 1 comorbidity: medical history are also affecting patient's functional outcome.   REHAB POTENTIAL: Good  CLINICAL DECISION MAKING: Stable/uncomplicated  EVALUATION COMPLEXITY: Low   GOALS: Goals reviewed with patient? Yes  SHORT TERM GOALS: Target date: 04/12/2023 - updated 04/12/23    Pt will be independent with HEP.   Baseline: Goal status: MET 04/12/23  2.  Pt will be independent with use of squatty potty, relaxed toileting mechanics, and improved bowel movement techniques in order to increase ease of bowel movements and complete evacuation.   Baseline:  Goal status: MET 04/12/23  3.  Pt will perform daily self bowel mobilization in order to improve frequency and complete evacuation.  Baseline:  Goal status: Met 04/12/23    LONG TERM GOALS: Updated 11/30/23  Pt will be independent with advanced HEP.   Baseline:  Goal status: IN PROGRESS 11/30/23  2.  Pt will demonstrate improved mobility in abdominal scar tissue. Baseline:  Goal status: IN PROGRESS 11/30/23  3.  Pt will be able to perform curl-up test in supine without abdominal distortion. Baseline: still has abdominal  distortion, but with cuing for better transversus abdominus contraction he was able to improve Goal status: IN PROGRESS 11/30/23  4.  Pt will be able to perform single leg stance bil without any pelvic drop in order to demonstrate improved functional core strength.  Baseline:  Goal status: MET 08/14/23  5.  Pt will report 75% improvement in fecal urgency. Baseline: he's currently at 40% improvement Goal status: IN PROGRESS 11/30/23  6.  Pt will report no episodes of fecal incontinence.  Baseline: in the last week, he has had 2 episodes of fecal incontinence Goal status: IN PROGRESS 11/30/23   PLAN:  PT FREQUENCY: 1-2x/week  PT DURATION: 6 months  PLANNED INTERVENTIONS: 97110-Therapeutic exercises, 97530- Therapeutic activity, 97112- Neuromuscular re-education, 97535- Self Care, [id number]- Manual therapy, Dry Needling, and Biofeedback  PLAN FOR NEXT SESSION: Continue manual techniques to pelvic scar tissue and low back; progress core training; progress mobility activities.    Josette Mares, PT, DPT11/03/258:48 AM

## 2023-12-19 ENCOUNTER — Other Ambulatory Visit

## 2023-12-22 NOTE — Discharge Instructions (Signed)

## 2023-12-25 ENCOUNTER — Ambulatory Visit
Admission: RE | Admit: 2023-12-25 | Discharge: 2023-12-25 | Disposition: A | Discharge status: Home / Self Care | Source: Ambulatory Visit | Attending: Family Medicine | Admitting: Family Medicine

## 2023-12-25 DIAGNOSIS — M542 Cervicalgia: Secondary | ICD-10-CM

## 2023-12-25 MED ORDER — IOPAMIDOL (ISOVUE-M 300) INJECTION 61%
1.0000 mL | Freq: Once | INTRAMUSCULAR | Status: AC
  Administered 2023-12-25: 1 mL via EPIDURAL

## 2023-12-25 MED ORDER — TRIAMCINOLONE ACETONIDE 40 MG/ML IJ SUSP (RADIOLOGY)
60.0000 mg | Freq: Once | INTRAMUSCULAR | Status: AC
  Administered 2023-12-25: 60 mg via EPIDURAL

## 2024-01-09 ENCOUNTER — Ambulatory Visit

## 2024-01-09 DIAGNOSIS — M6281 Muscle weakness (generalized): Secondary | ICD-10-CM

## 2024-01-09 DIAGNOSIS — R293 Abnormal posture: Secondary | ICD-10-CM

## 2024-01-09 DIAGNOSIS — M62838 Other muscle spasm: Secondary | ICD-10-CM

## 2024-01-09 DIAGNOSIS — M5459 Other low back pain: Secondary | ICD-10-CM

## 2024-01-09 DIAGNOSIS — R279 Unspecified lack of coordination: Secondary | ICD-10-CM

## 2024-01-09 NOTE — Therapy (Signed)
 OUTPATIENT PHYSICAL THERAPY MALE PELVIC TREATMENT   Patient Name: Edward [name] MRN: [id number] DOB:04/17/1975, 48 y.o., male Today's Date: 01/09/2024  END OF SESSION:  PT End of Session - 01/09/24 1102     Visit Number 20    Date for Recertification  01/30/24    Authorization Type [employer]    Authorization Time Period signed dry needling consent 12/13/23    PT Start Time 1100    PT Stop Time 1140    PT Time Calculation (min) 40 min    Activity Tolerance Patient tolerated treatment well    Behavior During Therapy WFL for tasks assessed/performed             Past Medical History:  Diagnosis Date   Arthritis    OA right knee   Family history of prostate cancer    Kidney stones    has a horse shoe kidney   Rectal cancer (HCC)    Thyroid  disease    History reviewed. No pertinent surgical history. Patient Active Problem List   Diagnosis Date Noted   TFCC (triangular fibrocartilage complex) injury, right, initial encounter 11/08/2023   Snoring 01/20/2023   Degenerative disc disease, cervical 01/02/2023   Chronic SI joint pain 01/02/2023   Midline back pain 06/08/2022   Right carpal tunnel syndrome 04/25/2022   Adjustment disorder 11/04/2021   Skin mole 08/11/2021   Extensor carpi ulnaris tendinitis 06/01/2021   Routine general medical examination at a health care facility 01/05/2021   Genetic testing 12/28/2020   [facility] joint pain 11/24/2020   Rectal cancer (HCC) 09/22/2020   Insomnia 09/22/2020   Hypothyroid 09/22/2020   Nonallopathic lesion of thoracic region 11/25/2018   Nonallopathic lesion of sacral region 11/25/2018   Nonallopathic lesion of lumbosacral region 11/25/2018   Nonallopathic lesion of rib cage 11/25/2018   Nonallopathic lesion of cervical region 11/25/2018   Trigger point of left shoulder region 08/29/2018   Patellofemoral arthralgia of right knee 07/25/2018   Scapular dyskinesis 07/25/2018    PCP: Rollene Almarie LABOR, MD  REFERRING PROVIDER:  Rollene Almarie LABOR, MD  REFERRING DIAG: C20 (ICD-10-CM) - Rectal cancer [facility])  THERAPY DIAG:  Muscle weakness (generalized)  Other muscle spasm  Unspecified lack of coordination  Other low back pain  Abnormal posture  Rationale for Evaluation and Treatment: Rehabilitation  ONSET DATE: 2022  SUBJECTIVE:                                                                                                                                                                                           SUBJECTIVE STATEMENT: Pt states that he had one episode of fecal incontinence  and it was walking uphill, he states that he could not get control over it. He states that he has had two injections in low back. He is still having notable pain in low back and both of his feet are tingling. He did use his pelvic floor muscle wand. He is working on stretching more.   PAIN: 01/09/24 Are you having pain? Yes NPRS scale: 6/10 Pain location: low back  Pain type: aching and tight Pain description: constant   Aggravating factors: straining, sleeping positions, prolonged standing - coming up from bending Relieving factors: rest, avoiding straining  PRECAUTIONS: Other: rectal cancer 2022 diagnosis with surgery x2 and chemo  RED FLAGS: None   WEIGHT BEARING RESTRICTIONS: No  FALLS:  Has patient fallen in last 6 months? 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, mild disc/facet degeneration in mid-lower lumbar spine  BOWEL MOVEMENT: Pain with bowel movement: No Type of bowel movement:Type (Bristol Stool Scale) 1-7, Frequency random, and Strain Yes Fully empty rectum: No Leakage: Yes: improved with immodium and ondanestron - 3 or 4 accidents  since reversal  Pads: No Fiber supplement: Yes: tried metamucil in the past  URINATION: Pain with urination: No Fully empty bladder: Yes: - Stream: Strong Urgency: No Frequency: 1-2x/night; few times a day  Leakage: none Pads: No  INTERCOURSE: Pain with intercourse: none Climax: WNL Ejaculation: Yes: -   OBJECTIVE:  Note: Objective measures were completed at Evaluation unless otherwise noted. 03/22/23:             External Perineal Exam deferred to next session              Internal Pelvic Floor Significant restriction and scar tissue with tenderness  Patient confirms identification and approves PT to assess internal pelvic floor and treatment Yes   PELVIC MMT:    MMT eval  Internal Anal Sphincter 1/5  External Anal Sphincter 2/5 with multimodal cues to help isolate contraction  Puborectalis 1/5  Diastasis Recti 3 finger width diastasis with distortion  (Blank rows = not tested)  TONE: deferred to next session 03/15/23:  COGNITION: Overall cognitive status: Within functional limits for tasks assessed     SENSATION: Light touch: Appears intact Proprioception: Appears intact   FUNCTIONAL TESTS:  Squat: WNL, some Rt knee pain Single leg stance:  Rt: Lt pelvic drop  Lt:Rt pelvic drop Curl-up test: 3 finger width diastasis with distortion inferior/superior to umbilicus Sit-up test: 2/3  GAIT: Comments: WNL  POSTURE: rounded shoulders, forward head, decreased lumbar lordosis, decreased thoracic kyphosis, and posterior pelvic tilt  PELVIC ALIGNMENT: Rt posterior rotation  LUMBARAROM/PROM:  A/PROM A/PROM  Eval (% available)  Flexion 75  Extension 50  Right lateral flexion 50  Left lateral flexion 50  Right rotation 50  Left rotation 75   (Blank rows = not tested)   PALPATION: GENERAL significant scar tissue restriction in abdomen, Rt>Lt; tenderness in lower abdomen; rib cage restriction, lumbar paraspinal tone, Rt>Lt; tenderness at midline throughout  lumbar and lower thoracic spine; decreased lumbar spine mobility with PA mobilizations     TODAY'S TREATMENT:  DATE:  01/09/24 Manual: Prone: Myofascial release to thoracolumbar fascia and low back Negative pressure soft tissue mobilization to low back and bil hips Instrument assisted soft tissue mobilization to lumbar paraspinals, obliques, quadratus lumborum, and proximal glutes Soft tissue mobilization and trigger point release to glutes and lumbar paraspinals Grade 1-2 posterior to anterior L1-S1 mobilizations Therapeutic activities: Discussed talking with MD about checking testosterone due to premature ejaculation  Review strengthening HEP and make sure to perform strengthening exercises when having pain to improve lumbar support   12/18/23 Manual: Pt provides verbal consent for internal rectal pelvic floor exam. Internal rectal pelvic floor muscle release in Lt side lying  Supine myofascial release to lower midline abdomen with constant pressure Supine abdominal scar tissue mobilization   12/13/23 Trigger Point Dry Needling  Subsequent Treatment: Instructions provided previously at initial dry needling treatment.  Instructions reviewed, if requested by the patient, prior to subsequent dry needling treatment.   Patient Verbal Consent Given: Yes Education Handout Provided: Previously Provided Muscles Treated: L4-5 multifidi, bil glutes Electrical Stimulation Performed: No Treatment Response/Outcome: twitch response and release  Manual: Prone: Soft tissue mobilization to lumbar paraspinals Soft tissue mobilization to sacral paraspinals Negative pressure soft tissue mobilization to lumbar paraspinals, obliques, and thoracolumbar fascia Myofascial release to thoracolumbar fascia   to help improve consistency of bowel movements  PATIENT EDUCATION:   Education details: See above Person educated: Patient Education method: Explanation, Demonstration, Tactile cues, Verbal cues, and Handouts Education comprehension: verbalized understanding  HOME EXERCISE PROGRAM: [id number]  ASSESSMENT:  CLINICAL IMPRESSION: Pt continuing to have significant low back pain and numbness/tingling in bil LE. He is seeing spine specialist at [facility] soon to discuss options. We focused on manual techniques to low back and hips today with good tolerance and reduced pain at end of session. Notable increase in restriction on Rt compared to Lt. We did discuss at length the importance of performing core and hip strengthening to help low back pain and improve pelvic floor muscle function; pt education performed on allowing pain to be a reminder to perform exercises and help improve comfort. He will continue to benefit from skilled PT intervention in order to decrease bowel dysfunction, improve abdominal pressure management, and begin/progress functional strengthening program.   OBJECTIVE IMPAIRMENTS: decreased activity tolerance, decreased coordination, decreased endurance, decreased mobility, decreased strength, increased fascial restrictions, increased muscle spasms, impaired tone, postural dysfunction, and pain.   ACTIVITY LIMITATIONS: continence  PARTICIPATION LIMITATIONS: community activity, occupation, and exercise  PERSONAL FACTORS: 1 comorbidity: medical history are also affecting patient's functional outcome.   REHAB POTENTIAL: Good  CLINICAL DECISION MAKING: Stable/uncomplicated  EVALUATION COMPLEXITY: Low   GOALS: Goals reviewed with patient? Yes  SHORT TERM GOALS: Target date: 04/12/2023 - updated 04/12/23    Pt will be independent with HEP.   Baseline: Goal status: MET 04/12/23  2.  Pt will be independent with use of squatty potty, relaxed toileting mechanics, and improved bowel movement techniques in order to increase ease of bowel movements and  complete evacuation.   Baseline:  Goal status: MET 04/12/23  3.  Pt will perform daily self bowel mobilization in order to improve frequency and complete evacuation.  Baseline:  Goal status: Met 04/12/23    LONG TERM GOALS: Updated 11/30/23  Pt will be independent with advanced HEP.   Baseline:  Goal status: IN PROGRESS 01/09/24  2.  Pt will demonstrate improved mobility in abdominal scar tissue. Baseline:  Goal status: IN PROGRESS 01/09/24  3.  Pt will be  able to perform curl-up test in supine without abdominal distortion. Baseline: still has abdominal distortion, but with cuing for better transversus abdominus contraction he was able to improve Goal status: IN PROGRESS 01/09/24  4.  Pt will be able to perform single leg stance bil without any pelvic drop in order to demonstrate improved functional core strength.  Baseline:  Goal status: MET 08/14/23  5.  Pt will report 75% improvement in fecal urgency. Baseline: he's currently at 40% improvement Goal status: IN PROGRESS 01/09/24  6.  Pt will report no episodes of fecal incontinence.  Baseline: in the last week, he has had 2 episodes of fecal incontinence Goal status: IN PROGRESS 01/09/24   PLAN:  PT FREQUENCY: 1-2x/week  PT DURATION: 6 months  PLANNED INTERVENTIONS: 97110-Therapeutic exercises, 97530- Therapeutic activity, 97112- Neuromuscular re-education, 97535- Self Care, [id number]- Manual therapy, Dry Needling, and Biofeedback  PLAN FOR NEXT SESSION: Continue manual techniques to pelvic scar tissue and low back; progress core training; progress mobility activities.    Josette Mares, PT, DPT11/25/2511:45 AM

## 2024-01-18 ENCOUNTER — Ambulatory Visit: Attending: Internal Medicine

## 2024-01-18 DIAGNOSIS — R279 Unspecified lack of coordination: Secondary | ICD-10-CM | POA: Insufficient documentation

## 2024-01-18 DIAGNOSIS — R293 Abnormal posture: Secondary | ICD-10-CM | POA: Insufficient documentation

## 2024-01-18 DIAGNOSIS — M62838 Other muscle spasm: Secondary | ICD-10-CM | POA: Insufficient documentation

## 2024-01-18 DIAGNOSIS — M5459 Other low back pain: Secondary | ICD-10-CM | POA: Diagnosis present

## 2024-01-18 DIAGNOSIS — M6281 Muscle weakness (generalized): Secondary | ICD-10-CM | POA: Insufficient documentation

## 2024-01-18 NOTE — Therapy (Signed)
 OUTPATIENT PHYSICAL THERAPY MALE PELVIC TREATMENT   Patient Name: Edward [name] MRN: [id number] DOB:1975-05-31, 48 y.o., male Today's Date: 01/18/2024  END OF SESSION:  PT End of Session - 01/18/24 0800     Visit Number 21    Date for Recertification  01/30/24    Authorization Type [employer]etna    Authorization Time Period signed dry needling consent 12/13/23    PT Start Time 0800    PT Stop Time 0840    PT Time Calculation (min) 40 min    Activity Tolerance Patient tolerated treatment well    Behavior During Therapy WFL for tasks assessed/performed             Past Medical History:  Diagnosis Date   Arthritis    OA right knee   Family history of prostate cancer    Kidney stones    has a horse shoe kidney   Rectal cancer (HCC)    Thyroid  disease    History reviewed. No pertinent surgical history. Patient Active Problem List   Diagnosis Date Noted   TFCC (triangular fibrocartilage complex) injury, right, initial encounter 11/08/2023   Snoring 01/20/2023   Degenerative disc disease, cervical 01/02/2023   Chronic SI joint pain 01/02/2023   Midline back pain 06/08/2022   Right carpal tunnel syndrome 04/25/2022   Adjustment disorder 11/04/2021   Skin mole 08/11/2021   Extensor carpi ulnaris tendinitis 06/01/2021   Routine general medical examination at a health care facility 01/05/2021   Genetic testing 12/28/2020   [facility] joint pain 11/24/2020   Rectal cancer (HCC) 09/22/2020   Insomnia 09/22/2020   Hypothyroid 09/22/2020   Nonallopathic lesion of thoracic region 11/25/2018   Nonallopathic lesion of sacral region 11/25/2018   Nonallopathic lesion of lumbosacral region 11/25/2018   Nonallopathic lesion of rib cage 11/25/2018   Nonallopathic lesion of cervical region 11/25/2018   Trigger point of left shoulder region 08/29/2018   Patellofemoral arthralgia of right knee 07/25/2018   Scapular dyskinesis 07/25/2018    PCP: Rollene Almarie LABOR, MD  REFERRING PROVIDER:  Rollene Almarie LABOR, MD  REFERRING DIAG: C20 (ICD-10-CM) - Rectal cancer [facility])  THERAPY DIAG:  Muscle weakness (generalized)  Other muscle spasm  Unspecified lack of coordination  Other low back pain  Abnormal posture  Rationale for Evaluation and Treatment: Rehabilitation  ONSET DATE: 2022  SUBJECTIVE:                                                                                                                                                                                           SUBJECTIVE STATEMENT: Pt states that he has been focusing on the bad  things in sessions, but he does feel like there is progress. He has not had an accident in the last month. He feels like he does not have to rush to the bathroom as much and is emptying better. He is still have tingling in bil feet and his low back is sore.   PAIN: 01/18/24 Are you having pain? Yes NPRS scale: 7/10 Pain location: low back  Pain type: aching and tight Pain description: constant   Aggravating factors: straining, sleeping positions, prolonged standing - coming up from bending Relieving factors: rest, avoiding straining  PRECAUTIONS: Other: rectal cancer 2022 diagnosis with surgery x2 and chemo  RED FLAGS: None   WEIGHT BEARING RESTRICTIONS: No  FALLS:  Has patient fallen in last 6 months? 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, mild disc/facet degeneration in mid-lower lumbar spine  BOWEL MOVEMENT: Pain with bowel movement: No Type of bowel movement:Type (Bristol Stool Scale) 1-7, Frequency random, and Strain Yes Fully empty rectum: No Leakage: Yes: improved with immodium and ondanestron - 3 or 4 accidents since reversal  Pads: No Fiber  supplement: Yes: tried metamucil in the past  URINATION: Pain with urination: No Fully empty bladder: Yes: - Stream: Strong Urgency: No Frequency: 1-2x/night; few times a day  Leakage: none Pads: No  INTERCOURSE: Pain with intercourse: none Climax: WNL Ejaculation: Yes: -   OBJECTIVE:  Note: Objective measures were completed at Evaluation unless otherwise noted. 03/22/23:             External Perineal Exam deferred to next session              Internal Pelvic Floor Significant restriction and scar tissue with tenderness  Patient confirms identification and approves PT to assess internal pelvic floor and treatment Yes   PELVIC MMT:    MMT eval  Internal Anal Sphincter 1/5  External Anal Sphincter 2/5 with multimodal cues to help isolate contraction  Puborectalis 1/5  Diastasis Recti 3 finger width diastasis with distortion  (Blank rows = not tested)  TONE: deferred to next session 03/15/23:  COGNITION: Overall cognitive status: Within functional limits for tasks assessed     SENSATION: Light touch: Appears intact Proprioception: Appears intact   FUNCTIONAL TESTS:  Squat: WNL, some Rt knee pain Single leg stance:  Rt: Lt pelvic drop  Lt:Rt pelvic drop Curl-up test: 3 finger width diastasis with distortion inferior/superior to umbilicus Sit-up test: 2/3  GAIT: Comments: WNL  POSTURE: rounded shoulders, forward head, decreased lumbar lordosis, decreased thoracic kyphosis, and posterior pelvic tilt  PELVIC ALIGNMENT: Rt posterior rotation  LUMBARAROM/PROM:  A/PROM A/PROM  Eval (% available)  Flexion 75  Extension 50  Right lateral flexion 50  Left lateral flexion 50  Right rotation 50  Left rotation 75   (Blank rows = not tested)   PALPATION: GENERAL significant scar tissue restriction in abdomen, Rt>Lt; tenderness in lower abdomen; rib cage restriction, lumbar paraspinal tone, Rt>Lt; tenderness at midline throughout lumbar and lower thoracic spine;  decreased lumbar spine mobility with PA mobilizations     TODAY'S TREATMENT:  DATE:  01/18/24 Manual: Supine abdominal scar tissue mobilization  Neuromuscular re-education: Bear plank lift with hip adduction 2 x 10 Exercises: Cat cow 2 x 10 Quadruped fire hydrants 2 x 10 bil Quadruped donkey kick 2 x 10 bil    01/09/24 Manual: Prone: Myofascial release to thoracolumbar fascia and low back Negative pressure soft tissue mobilization to low back and bil hips Instrument assisted soft tissue mobilization to lumbar paraspinals, obliques, quadratus lumborum, and proximal glutes Soft tissue mobilization and trigger point release to glutes and lumbar paraspinals Grade 1-2 posterior to anterior L1-S1 mobilizations Therapeutic activities: Discussed talking with MD about checking testosterone due to premature ejaculation  Review strengthening HEP and make sure to perform strengthening exercises when having pain to improve lumbar support   12/18/23 Manual: Pt provides verbal consent for internal rectal pelvic floor exam. Internal rectal pelvic floor muscle release in Lt side lying  Supine myofascial release to lower midline abdomen with constant pressure Supine abdominal scar tissue mobilization     PATIENT EDUCATION:  Education details: See above Person educated: Patient Education method: Programmer, Multimedia, Demonstration, Tactile cues, Verbal cues, and Handouts Education comprehension: verbalized understanding  HOME EXERCISE PROGRAM: [id number]  ASSESSMENT:  CLINICAL IMPRESSION: Pt is still having low back pain, but wanted to focus on abdominal scar tissue mobilization and core strengthening. He tolerated manual techniques to abdomen well, demonstrating improved mobility. He did have area of increased tightness directly under umbilicus and to the Lt. He tolerated  strengthening activities well. As he starts to explore work out programs to begin using body weight, we discussed being aware of breath holding, abdominal bracing, and any increase in pain as signs to modify. He will continue to benefit from skilled PT intervention in order to decrease bowel dysfunction, improve abdominal pressure management, and begin/progress functional strengthening program.   OBJECTIVE IMPAIRMENTS: decreased activity tolerance, decreased coordination, decreased endurance, decreased mobility, decreased strength, increased fascial restrictions, increased muscle spasms, impaired tone, postural dysfunction, and pain.   ACTIVITY LIMITATIONS: continence  PARTICIPATION LIMITATIONS: community activity, occupation, and exercise  PERSONAL FACTORS: 1 comorbidity: medical history are also affecting patient's functional outcome.   REHAB POTENTIAL: Good  CLINICAL DECISION MAKING: Stable/uncomplicated  EVALUATION COMPLEXITY: Low   GOALS: Goals reviewed with patient? Yes  SHORT TERM GOALS: Target date: 04/12/2023 - updated 04/12/23    Pt will be independent with HEP.   Baseline: Goal status: MET 04/12/23  2.  Pt will be independent with use of squatty potty, relaxed toileting mechanics, and improved bowel movement techniques in order to increase ease of bowel movements and complete evacuation.   Baseline:  Goal status: MET 04/12/23  3.  Pt will perform daily self bowel mobilization in order to improve frequency and complete evacuation.  Baseline:  Goal status: Met 04/12/23    LONG TERM GOALS: Updated 11/30/23  Pt will be independent with advanced HEP.   Baseline:  Goal status: IN PROGRESS 01/09/24  2.  Pt will demonstrate improved mobility in abdominal scar tissue. Baseline:  Goal status: IN PROGRESS 01/09/24  3.  Pt will be able to perform curl-up test in supine without abdominal distortion. Baseline: still has abdominal distortion, but with cuing for better  transversus abdominus contraction he was able to improve Goal status: IN PROGRESS 01/09/24  4.  Pt will be able to perform single leg stance bil without any pelvic drop in order to demonstrate improved functional core strength.  Baseline:  Goal status: MET 08/14/23  5.  Pt will report 75%  improvement in fecal urgency. Baseline: he's currently at 40% improvement Goal status: IN PROGRESS 01/09/24  6.  Pt will report no episodes of fecal incontinence.  Baseline: in the last week, he has had 2 episodes of fecal incontinence Goal status: IN PROGRESS 01/09/24   PLAN:  PT FREQUENCY: 1-2x/week  PT DURATION: 6 months  PLANNED INTERVENTIONS: 97110-Therapeutic exercises, 97530- Therapeutic activity, 97112- Neuromuscular re-education, 97535- Self Care, 02859- Manual therapy, Dry Needling, and Biofeedback  PLAN FOR NEXT SESSION: Continue manual techniques to pelvic scar tissue and low back; progress core training; progress mobility activities.    Josette Mares, PT, DPT12/04/258:29 AM

## 2024-01-25 ENCOUNTER — Ambulatory Visit

## 2024-01-25 DIAGNOSIS — M6281 Muscle weakness (generalized): Secondary | ICD-10-CM

## 2024-01-25 DIAGNOSIS — M62838 Other muscle spasm: Secondary | ICD-10-CM

## 2024-01-25 DIAGNOSIS — M5459 Other low back pain: Secondary | ICD-10-CM

## 2024-01-25 DIAGNOSIS — R279 Unspecified lack of coordination: Secondary | ICD-10-CM

## 2024-01-25 DIAGNOSIS — R293 Abnormal posture: Secondary | ICD-10-CM

## 2024-01-25 NOTE — Therapy (Signed)
 OUTPATIENT PHYSICAL THERAPY MALE PELVIC TREATMENT   Patient Name: Edward [name] MRN: [id number] DOB:Aug 02, 1975, 48 y.o., male Today's Date: 01/25/2024  END OF SESSION:  PT End of Session - 01/25/24 0808     Visit Number 22    Date for Recertification  01/30/24    Authorization Type Aet[employer]    PT Start Time 0807    PT Stop Time 0845    PT Time Calculation (min) 38 min    Activity Tolerance Patient tolerated treatment well    Behavior During Therapy [facility] for tasks assessed/performed             Past Medical History:  Diagnosis Date   Arthritis    OA right knee   Family history of prostate cancer    Kidney stones    has a horse shoe kidney   Rectal cancer (HCC)    Thyroid  disease    History reviewed. No pertinent surgical history. Patient Active Problem List   Diagnosis Date Noted   TFCC (triangular fibrocartilage complex) injury, right, initial encounter 11/08/2023   Snoring 01/20/2023   Degenerative disc disease, cervical 01/02/2023   Chronic SI joint pain 01/02/2023   Midline back pain 06/08/2022   Right carpal tunnel syndrome 04/25/2022   Adjustment disorder 11/04/2021   Skin mole 08/11/2021   Extensor carpi ulnaris tendinitis 06/01/2021   Routine general medical examination at a health care facility 01/05/2021   Genetic testing 12/28/2020   [facility] joint pain 11/24/2020   Rectal cancer (HCC) 09/22/2020   Insomnia 09/22/2020   Hypothyroid 09/22/2020   Nonallopathic lesion of thoracic region 11/25/2018   Nonallopathic lesion of sacral region 11/25/2018   Nonallopathic lesion of lumbosacral region 11/25/2018   Nonallopathic lesion of rib cage 11/25/2018   Nonallopathic lesion of cervical region 11/25/2018   Trigger point of left shoulder region 08/29/2018   Patellofemoral arthralgia of right knee 07/25/2018   Scapular dyskinesis 07/25/2018    PCP: Rollene Almarie LABOR, MD  REFERRING PROVIDER: Rollene Almarie LABOR, MD  REFERRING DIAG: C20 (ICD-10-CM) -  Rectal cancer [facility])  THERAPY DIAG:  Muscle weakness (generalized)  Other muscle spasm  Unspecified lack of coordination  Other low back pain  Abnormal posture  Rationale for Evaluation and Treatment: Rehabilitation  ONSET DATE: 2022  SUBJECTIVE:                                                                                                                                                                                           SUBJECTIVE STATEMENT: Pt states that he has not had to take constipation medication for over two weeks and has also not had any  loose stools.   PAIN: 01/25/24 Are you having pain? Yes NPRS scale: 7/10 Pain location: low back  Pain type: aching and tight Pain description: constant   Aggravating factors: straining, sleeping positions, prolonged standing - coming up from bending Relieving factors: rest, avoiding straining  PRECAUTIONS: Other: rectal cancer 2022 diagnosis with surgery x2 and chemo  RED FLAGS: None   WEIGHT BEARING RESTRICTIONS: No  FALLS:  Has patient fallen in last 6 months? 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, mild disc/facet degeneration in mid-lower lumbar spine  BOWEL MOVEMENT: Pain with bowel movement: No Type of bowel movement:Type (Bristol Stool Scale) 1-7, Frequency random, and Strain Yes Fully empty rectum: No Leakage: Yes: improved with immodium and ondanestron - 3 or 4 accidents since reversal  Pads: No Fiber supplement: Yes: tried metamucil in the past  URINATION: Pain with urination: No Fully empty bladder: Yes: - Stream: Strong Urgency: No Frequency: 1-2x/night; few times a day  Leakage: none Pads: No  INTERCOURSE: Pain with intercourse:  none Climax: WNL Ejaculation: Yes: -   OBJECTIVE:  Note: Objective measures were completed at Evaluation unless otherwise noted. 03/22/23:             External Perineal Exam deferred to next session              Internal Pelvic Floor Significant restriction and scar tissue with tenderness  Patient confirms identification and approves PT to assess internal pelvic floor and treatment Yes   PELVIC MMT:    MMT eval  Internal Anal Sphincter 1/5  External Anal Sphincter 2/5 with multimodal cues to help isolate contraction  Puborectalis 1/5  Diastasis Recti 3 finger width diastasis with distortion  (Blank rows = not tested)  TONE: deferred to next session 03/15/23:  COGNITION: Overall cognitive status: Within functional limits for tasks assessed     SENSATION: Light touch: Appears intact Proprioception: Appears intact   FUNCTIONAL TESTS:  Squat: WNL, some Rt knee pain Single leg stance:  Rt: Lt pelvic drop  Lt:Rt pelvic drop Curl-up test: 3 finger width diastasis with distortion inferior/superior to umbilicus Sit-up test: 2/3  GAIT: Comments: WNL  POSTURE: rounded shoulders, forward head, decreased lumbar lordosis, decreased thoracic kyphosis, and posterior pelvic tilt  PELVIC ALIGNMENT: Rt posterior rotation  LUMBARAROM/PROM:  A/PROM A/PROM  Eval (% available)  Flexion 75  Extension 50  Right lateral flexion 50  Left lateral flexion 50  Right rotation 50  Left rotation 75   (Blank rows = not tested)   PALPATION: GENERAL significant scar tissue restriction in abdomen, Rt>Lt; tenderness in lower abdomen; rib cage restriction, lumbar paraspinal tone, Rt>Lt; tenderness at midline throughout lumbar and lower thoracic spine; decreased lumbar spine mobility with PA mobilizations     TODAY'S TREATMENT:  DATE:  01/25/24 Manual: Supine  abdominal scar tissue mobilization  Neuromuscular re-education: Bird dog 12x Bear plank lift 10x Modified side plank with clam shell 10x bil Exercises: Cat cow 2 x 10 Tail wags 2 x 10 Fire hydrants 10x bil Supine piriformis stretch 60 sec bil   01/18/24 Manual: Supine abdominal scar tissue mobilization  Neuromuscular re-education: Bear plank lift with hip adduction 2 x 10 Exercises: Cat cow 2 x 10 Quadruped fire hydrants 2 x 10 bil Quadruped donkey kick 2 x 10 bil    01/09/24 Manual: Prone: Myofascial release to thoracolumbar fascia and low back Negative pressure soft tissue mobilization to low back and bil hips Instrument assisted soft tissue mobilization to lumbar paraspinals, obliques, quadratus lumborum, and proximal glutes Soft tissue mobilization and trigger point release to glutes and lumbar paraspinals Grade 1-2 posterior to anterior L1-S1 mobilizations Therapeutic activities: Discussed talking with MD about checking testosterone due to premature ejaculation  Review strengthening HEP and make sure to perform strengthening exercises when having pain to improve lumbar support   PATIENT EDUCATION:  Education details: See above Person educated: Patient Education method: Programmer, Multimedia, Demonstration, Tactile cues, Verbal cues, and Handouts Education comprehension: verbalized understanding  HOME EXERCISE PROGRAM: [id number]  ASSESSMENT:  CLINICAL IMPRESSION: Pt seeing some good progress with regular bowel movements, not having accidents, and not having to take medication to help reduce diarrhea. He is still having notable low back pain and tingling into bil LE; he is waiting to see specialist. We continued scar tissue mobilization to abdomen today with good tolerance, but more tightness noted inferior to umbilicus. Good tolerance to quadruped mobility and core strengthening activities, demonstrating some improvements in core stability. He will continue to benefit from  skilled PT intervention in order to decrease bowel dysfunction, improve abdominal pressure management, and begin/progress functional strengthening program.   OBJECTIVE IMPAIRMENTS: decreased activity tolerance, decreased coordination, decreased endurance, decreased mobility, decreased strength, increased fascial restrictions, increased muscle spasms, impaired tone, postural dysfunction, and pain.   ACTIVITY LIMITATIONS: continence  PARTICIPATION LIMITATIONS: community activity, occupation, and exercise  PERSONAL FACTORS: 1 comorbidity: medical history are also affecting patient's functional outcome.   REHAB POTENTIAL: Good  CLINICAL DECISION MAKING: Stable/uncomplicated  EVALUATION COMPLEXITY: Low   GOALS: Goals reviewed with patient? Yes  SHORT TERM GOALS: Target date: 04/12/2023 - updated 04/12/23    Pt will be independent with HEP.   Baseline: Goal status: MET 04/12/23  2.  Pt will be independent with use of squatty potty, relaxed toileting mechanics, and improved bowel movement techniques in order to increase ease of bowel movements and complete evacuation.   Baseline:  Goal status: MET 04/12/23  3.  Pt will perform daily self bowel mobilization in order to improve frequency and complete evacuation.  Baseline:  Goal status: Met 04/12/23    LONG TERM GOALS: Updated 11/30/23  Pt will be independent with advanced HEP.   Baseline:  Goal status: IN PROGRESS 01/09/24  2.  Pt will demonstrate improved mobility in abdominal scar tissue. Baseline:  Goal status: IN PROGRESS 01/09/24  3.  Pt will be able to perform curl-up test in supine without abdominal distortion. Baseline: still has abdominal distortion, but with cuing for better transversus abdominus contraction he was able to improve Goal status: IN PROGRESS 01/09/24  4.  Pt will be able to perform single leg stance bil without any pelvic drop in order to demonstrate improved functional core strength.  Baseline:   Goal status: MET 08/14/23  5.  Pt will  report 75% improvement in fecal urgency. Baseline: he's currently at 40% improvement Goal status: IN PROGRESS 01/09/24  6.  Pt will report no episodes of fecal incontinence.  Baseline: in the last week, he has had 2 episodes of fecal incontinence Goal status: IN PROGRESS 01/09/24   PLAN:  PT FREQUENCY: 1-2x/week  PT DURATION: 6 months  PLANNED INTERVENTIONS: 97110-Therapeutic exercises, 97530- Therapeutic activity, 97112- Neuromuscular re-education, 97535- Self Care, 02859- Manual therapy, Dry Needling, and Biofeedback  PLAN FOR NEXT SESSION: Continue manual techniques to pelvic scar tissue and low back; progress core training; progress mobility activities.    Josette Mares, PT, DPT12/11/20258:09 AM

## 2024-01-30 ENCOUNTER — Ambulatory Visit

## 2024-01-30 DIAGNOSIS — M6281 Muscle weakness (generalized): Secondary | ICD-10-CM | POA: Diagnosis not present

## 2024-01-30 DIAGNOSIS — R279 Unspecified lack of coordination: Secondary | ICD-10-CM

## 2024-01-30 DIAGNOSIS — M62838 Other muscle spasm: Secondary | ICD-10-CM

## 2024-01-30 DIAGNOSIS — M5459 Other low back pain: Secondary | ICD-10-CM

## 2024-01-30 DIAGNOSIS — R293 Abnormal posture: Secondary | ICD-10-CM

## 2024-01-30 NOTE — Therapy (Signed)
 OUTPATIENT PHYSICAL THERAPY MALE PELVIC TREATMENT   Patient Name: Edward [name] MRN: [id number] DOB:1975-06-09, 48 y.o., male Today's Date: 01/30/2024  END OF SESSION:  PT End of Session - 01/30/24 0931     Visit Number 23    Date for Recertification  07/16/24    Authorization Type Aetna    Authorization Time Period signed dry needling consent 12/13/23    PT Start Time 0930    PT Stop Time 1010    PT Time Calculation (min) 40 min    Activity Tolerance Patient tolerated treatment well    Behavior During Therapy WFL for tasks assessed/performed             Past Medical History:  Diagnosis Date   Arthritis    OA right knee   Family history of prostate cancer    Kidney stones    has a horse shoe kidney   Rectal cancer (HCC)    Thyroid  disease    History reviewed. No pertinent surgical history. Patient Active Problem List   Diagnosis Date Noted   TFCC (triangular fibrocartilage complex) injury, right, initial encounter 11/08/2023   Snoring 01/20/2023   Degenerative disc disease, cervical 01/02/2023   Chronic SI joint pain 01/02/2023   Midline back pain 06/08/2022   Right carpal tunnel syndrome 04/25/2022   Adjustment disorder 11/04/2021   Skin mole 08/11/2021   Extensor carpi ulnaris tendinitis 06/01/2021   Routine general medical examination at a health care facility 01/05/2021   Genetic testing 12/28/2020   [facility] joint pain 11/24/2020   Rectal cancer (HCC) 09/22/2020   Insomnia 09/22/2020   Hypothyroid 09/22/2020   Nonallopathic lesion of thoracic region 11/25/2018   Nonallopathic lesion of sacral region 11/25/2018   Nonallopathic lesion of lumbosacral region 11/25/2018   Nonallopathic lesion of rib cage 11/25/2018   Nonallopathic lesion of cervical region 11/25/2018   Trigger point of left shoulder region 08/29/2018   Patellofemoral arthralgia of right knee 07/25/2018   Scapular dyskinesis 07/25/2018    PCP: Rollene Almarie LABOR, MD  REFERRING PROVIDER:  Rollene Almarie LABOR, MD  REFERRING DIAG: C20 (ICD-10-CM) - Rectal cancer [facility])  THERAPY DIAG:  Muscle weakness (generalized)  Other muscle spasm  Unspecified lack of coordination  Other low back pain  Abnormal posture  Rationale for Evaluation and Treatment: Rehabilitation  ONSET DATE: 2022  SUBJECTIVE:                                                                                                                                                                                           SUBJECTIVE STATEMENT: Pt had to take small dose of constipating medication before  a lunch last week and then was backed up for several days after. He saw spine neurosurgeon yesterday; he reports that x-rays were fine. MD wants to do a repeat full spine MRI.   PAIN: 01/30/24 Are you having pain? Yes NPRS scale: 5/10 Pain location: low back  Pain type: aching and tight Pain description: constant   Aggravating factors: straining, sleeping positions, prolonged standing - coming up from bending Relieving factors: rest, avoiding straining  PRECAUTIONS: Other: rectal cancer 2022 diagnosis with surgery x2 and chemo  RED FLAGS: None   WEIGHT BEARING RESTRICTIONS: No  FALLS:  Has patient fallen in last 6 months? 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, mild disc/facet degeneration in mid-lower lumbar spine  BOWEL MOVEMENT: Pain with bowel movement: No Type of bowel movement:Type (Bristol Stool Scale) 1-7, Frequency random, and Strain Yes Fully empty rectum: No Leakage: Yes: improved with immodium and ondanestron - 3 or 4 accidents since reversal  Pads: No Fiber supplement: Yes: tried metamucil in the past  URINATION: Pain with  urination: No Fully empty bladder: Yes: - Stream: Strong Urgency: No Frequency: 1-2x/night; few times a day  Leakage: none Pads: No  INTERCOURSE: Pain with intercourse: none Climax: WNL Ejaculation: Yes: -   OBJECTIVE:  Note: Objective measures were completed at Evaluation unless otherwise noted. Single leg stance:  Rt: stable  Lt. Pelvic drop Sit-up test: 2/3 with breath holding and distortion Abdomen: improvements in pelvic floor muscle scar tissue restriction              Internal Pelvic Floor Significant restriction and scar tissue with tenderness, Lt puborectalis tight and tender  Patient confirms identification and approves PT to assess internal pelvic floor and treatment Yes   PELVIC MMT:    MMT 01/30/24  Internal Anal Sphincter 2/5  External Anal Sphincter 2/5, 10 second endurance with cuing   Puborectalis 2/5  Diastasis Recti 2 finger width diastasis with distortion, but improving   (Blank rows = not tested) 03/22/23:             External Perineal Exam deferred to next session              Internal Pelvic Floor Significant restriction and scar tissue with tenderness  Patient confirms identification and approves PT to assess internal pelvic floor and treatment Yes   PELVIC MMT:    MMT eval  Internal Anal Sphincter 1/5  External Anal Sphincter 2/5 with multimodal cues to help isolate contraction  Puborectalis 1/5  Diastasis Recti 3 finger width diastasis with distortion  (Blank rows = not tested)  TONE: deferred to next session 03/15/23:  COGNITION: Overall cognitive status: Within functional limits for tasks assessed     SENSATION: Light touch: Appears intact Proprioception: Appears intact   FUNCTIONAL TESTS:  Squat: WNL, some Rt knee pain Single leg stance:  Rt: Lt pelvic drop  Lt:Rt pelvic drop Curl-up test: 3 finger width diastasis with distortion inferior/superior to umbilicus Sit-up test: 2/3  GAIT: Comments: WNL  POSTURE: rounded  shoulders, forward head, decreased lumbar lordosis, decreased thoracic kyphosis, and posterior pelvic tilt  PELVIC ALIGNMENT: Rt posterior rotation  LUMBARAROM/PROM:  A/PROM A/PROM  Eval (% available)  Flexion 75  Extension 50  Right lateral flexion 50  Left lateral  flexion 50  Right rotation 50  Left rotation 75   (Blank rows = not tested)   PALPATION: GENERAL significant scar tissue restriction in abdomen, Rt>Lt; tenderness in lower abdomen; rib cage restriction, lumbar paraspinal tone, Rt>Lt; tenderness at midline throughout lumbar and lower thoracic spine; decreased lumbar spine mobility with PA mobilizations     TODAY'S TREATMENT:                                                                                                                              DATE:  01/30/24 RE-EVAL Manual: Supine abdominal scar tissue mobilization  Pt provides verbal consent for internal rectal pelvic floor exam. Internal rectal scar tissue mobilization  Internal rectal pelvic floor muscle reassessment Internal rectal puborectalis release, Lt>Rt   01/25/24 Manual: Supine abdominal scar tissue mobilization  Neuromuscular re-education: Bird dog 12x Bear plank lift 10x Modified side plank with clam shell 10x bil Exercises: Cat cow 2 x 10 Tail wags 2 x 10 Fire hydrants 10x bil Supine piriformis stretch 60 sec bil   01/18/24 Manual: Supine abdominal scar tissue mobilization  Neuromuscular re-education: Bear plank lift with hip adduction 2 x 10 Exercises: Cat cow 2 x 10 Quadruped fire hydrants 2 x 10 bil Quadruped donkey kick 2 x 10 bil   PATIENT EDUCATION:  Education details: See above Person educated: Patient Education method: Programmer, Multimedia, Facilities Manager, Actor cues, Verbal cues, and Handouts Education comprehension: verbalized understanding  HOME EXERCISE PROGRAM: [id number]  ASSESSMENT:  CLINICAL IMPRESSION: Pt has made great progress with having no fecal urgency,  improved control, not episodes of fecal incontinence in the last month (he was having on average 3/month), and improved pelvic floor muscle strength. He still demonstrates internal rectal scar tissue and muscle tension that is painful. His abdominal scar tissue has improved greatly, but believe it is still impacting core strength and coordination. This is likely playing a role in his persistent low back pain. He will continue to benefit from skilled PT intervention in order to decrease bowel dysfunction, improve abdominal pressure management, and begin/progress functional strengthening program.   OBJECTIVE IMPAIRMENTS: decreased activity tolerance, decreased coordination, decreased endurance, decreased mobility, decreased strength, increased fascial restrictions, increased muscle spasms, impaired tone, postural dysfunction, and pain.   ACTIVITY LIMITATIONS: continence  PARTICIPATION LIMITATIONS: community activity, occupation, and exercise  PERSONAL FACTORS: 1 comorbidity: medical history are also affecting patient's functional outcome.   REHAB POTENTIAL: Good  CLINICAL DECISION MAKING: Stable/uncomplicated  EVALUATION COMPLEXITY: Low   GOALS: Goals reviewed with patient? Yes  SHORT TERM GOALS: Target date: 04/12/2023 - updated 04/12/23    Pt will be independent with HEP.   Baseline: Goal status: MET 04/12/23  2.  Pt will be independent with use of squatty potty, relaxed toileting mechanics, and improved bowel movement techniques in order to increase ease of bowel movements and complete evacuation.   Baseline:  Goal status: MET 04/12/23  3.  Pt will perform daily self bowel mobilization in  order to improve frequency and complete evacuation.  Baseline:  Goal status: Met 04/12/23    LONG TERM GOALS: Updated 11/30/23  Pt will be independent with advanced HEP.   Baseline:  Goal status: IN PROGRESS 01/30/24  2.  Pt will demonstrate improved mobility in abdominal scar  tissue. Baseline:  Goal status: IN PROGRESS 01/30/24  3.  Pt will be able to perform curl-up test in supine without abdominal distortion. Baseline: still has abdominal distortion, but with cuing for better transversus abdominus contraction he was able to improve, 2 finger width diastasis Goal status: IN PROGRESS 01/30/24  4.  Pt will be able to perform single leg stance bil without any pelvic drop in order to demonstrate improved functional core strength.  Baseline: Rt stable, Lt pelvic drop Goal status: IN PROGRESS 08/14/23  5.  Pt will report 75% improvement in fecal urgency. Baseline: he states that he is not rushing to the bathroom at all Goal status: MET 01/30/24  6.  Pt will report no episodes of fecal incontinence.  Baseline: last episode of fecal incontinence was 12/28/23 Goal status: MET 01/30/24   PLAN:  PT FREQUENCY: 1-2x/week  PT DURATION: 6 months  PLANNED INTERVENTIONS: 97110-Therapeutic exercises, 97530- Therapeutic activity, 97112- Neuromuscular re-education, 97535- Self Care, 02859- Manual therapy, Dry Needling, and Biofeedback  PLAN FOR NEXT SESSION: Continue manual techniques to pelvic scar tissue and low back; progress core training; progress mobility activities.    Josette Mares, PT, DPT12/16/20259:31 AM

## 2024-02-22 ENCOUNTER — Telehealth: Payer: Self-pay

## 2024-02-22 DIAGNOSIS — E039 Hypothyroidism, unspecified: Secondary | ICD-10-CM

## 2024-02-22 DIAGNOSIS — Z0000 Encounter for general adult medical examination without abnormal findings: Secondary | ICD-10-CM

## 2024-02-22 NOTE — Telephone Encounter (Signed)
 Copied from CRM [id number]. Topic: Clinical - Request for Lab/Test Order >> Feb 22, 2024  2:25 PM Wess RAMAN wrote: Reason for CRM: Patient would like lab orders placed prior to doing his annual physical on 03/11/24   Callback #: [id number]

## 2024-02-26 NOTE — Telephone Encounter (Signed)
 Physical is needing to be r/s but pt is requesting labs prior

## 2024-02-26 NOTE — Telephone Encounter (Signed)
 Called and notified pt. R/s physical

## 2024-02-26 NOTE — Telephone Encounter (Signed)
Labs placed for future 

## 2024-02-26 NOTE — Addendum Note (Signed)
 Addended by: ROLLENE NORRIS A on: 02/26/2024 03:57 PM   Modules accepted: Orders

## 2024-02-27 ENCOUNTER — Other Ambulatory Visit

## 2024-02-27 DIAGNOSIS — E039 Hypothyroidism, unspecified: Secondary | ICD-10-CM

## 2024-02-27 DIAGNOSIS — Z0000 Encounter for general adult medical examination without abnormal findings: Secondary | ICD-10-CM | POA: Diagnosis not present

## 2024-02-27 LAB — LIPID PANEL
Cholesterol: 225 mg/dL — ABNORMAL HIGH (ref 28–200)
HDL: 43.7 mg/dL
LDL Cholesterol: 137 mg/dL — ABNORMAL HIGH (ref 10–99)
NonHDL: 180.81
Total CHOL/HDL Ratio: 5
Triglycerides: 221 mg/dL — ABNORMAL HIGH (ref 10.0–149.0)
VLDL: 44.2 mg/dL — ABNORMAL HIGH (ref 0.0–40.0)

## 2024-02-27 LAB — COMPREHENSIVE METABOLIC PANEL WITH GFR
ALT: 33 U/L (ref 3–53)
AST: 23 U/L (ref 5–37)
Albumin: 4.6 g/dL (ref 3.5–5.2)
Alkaline Phosphatase: 53 U/L (ref 39–117)
BUN: 10 mg/dL (ref 6–23)
CO2: 28 meq/L (ref 19–32)
Calcium: 9.2 mg/dL (ref 8.4–10.5)
Chloride: 105 meq/L (ref 96–112)
Creatinine, Ser: 0.78 mg/dL (ref 0.40–1.50)
GFR: 104.81 mL/min
Glucose, Bld: 92 mg/dL (ref 70–99)
Potassium: 3.7 meq/L (ref 3.5–5.1)
Sodium: 141 meq/L (ref 135–145)
Total Bilirubin: 0.8 mg/dL (ref 0.2–1.2)
Total Protein: 7.1 g/dL (ref 6.0–8.3)

## 2024-02-27 LAB — CBC
HCT: 43.4 % (ref 39.0–52.0)
Hemoglobin: 15 g/dL (ref 13.0–17.0)
MCHC: 34.6 g/dL (ref 30.0–36.0)
MCV: 92 fl (ref 78.0–100.0)
Platelets: 162 K/uL (ref 150.0–400.0)
RBC: 4.72 Mil/uL (ref 4.22–5.81)
RDW: 13.2 % (ref 11.5–15.5)
WBC: 5.2 K/uL (ref 4.0–10.5)

## 2024-02-27 LAB — TSH: TSH: 0.91 u[IU]/mL (ref 0.35–5.50)

## 2024-03-04 ENCOUNTER — Ambulatory Visit: Payer: Self-pay | Admitting: Internal Medicine

## 2024-03-05 ENCOUNTER — Ambulatory Visit: Admitting: Internal Medicine

## 2024-03-05 ENCOUNTER — Encounter: Payer: Self-pay | Admitting: Internal Medicine

## 2024-03-05 VITALS — BP 128/80 | HR 60 | Temp 97.5°F | Ht 69.0 in | Wt 172.2 lb

## 2024-03-05 DIAGNOSIS — Z85048 Personal history of other malignant neoplasm of rectum, rectosigmoid junction, and anus: Secondary | ICD-10-CM | POA: Diagnosis not present

## 2024-03-05 DIAGNOSIS — Z0000 Encounter for general adult medical examination without abnormal findings: Secondary | ICD-10-CM

## 2024-03-05 DIAGNOSIS — R5383 Other fatigue: Secondary | ICD-10-CM | POA: Diagnosis not present

## 2024-03-05 DIAGNOSIS — Z0001 Encounter for general adult medical examination with abnormal findings: Secondary | ICD-10-CM

## 2024-03-05 DIAGNOSIS — E039 Hypothyroidism, unspecified: Secondary | ICD-10-CM

## 2024-03-05 DIAGNOSIS — F5101 Primary insomnia: Secondary | ICD-10-CM

## 2024-03-05 LAB — TESTOSTERONE TOTAL,FREE,BIO, MALES
Albumin: 4.7 g/dL (ref 3.6–5.1)
Sex Hormone Binding: 23 nmol/L (ref 10–50)
Testosterone, Bioavailable: 124.9 ng/dL (ref 110.0–575.0)
Testosterone, Free: 58.3 pg/mL (ref 46.0–224.0)
Testosterone: 348 ng/dL (ref 250–827)

## 2024-03-05 LAB — VITAMIN D 25 HYDROXY (VIT D DEFICIENCY, FRACTURES): VITD: 17.52 ng/mL — ABNORMAL LOW (ref 30.00–100.00)

## 2024-03-05 LAB — FERRITIN: Ferritin: 31 ng/mL (ref 22.0–322.0)

## 2024-03-05 LAB — VITAMIN B12: Vitamin B-12: 374 pg/mL (ref 211–911)

## 2024-03-05 NOTE — Progress Notes (Signed)
 "  Subjective:   Patient ID: Edward [name], male    DOB: Jan 29, 1976, 49 y.o.   MRN: [id number]  The patient is here for physical. Pertinent topics discussed: Discussed the use of AI scribe software for clinical note transcription with the patient, who gave verbal consent to proceed.  History of Present Illness Edward [name] is a 49 year old male who presents for follow-up on bowel control issues and back pain.  He has ongoing bowel control issues and has not experienced any accidents in a couple of months. He experiences urgency and spends significant time in the bathroom, though the frequency of needing to return shortly after has decreased. He has been less reliant on medication since September or October, and he reports that pelvic floor therapy has helped.  In September, he experienced a severe back spasm. He received injections and consulted a specialist at [facility], where a full body x-ray was performed and he was told it looked fine. He has not followed up on a recommended MRI. Tingling in his feet has returned. He has gained weight and is not exercising as much, although he bikes to work and played soccer in the summer.  He stopped duloxetine  about a year ago and currently takes Klonopin for sleep, which he finds effective but is concerned about developing tolerance. He is considering switching back to trazodone , which he previously used. He mentions trazodone  might help lower cholesterol, though he is unsure of its effectiveness.  He has high cholesterol and notes an increase over the past year, possibly due to genetic factors and changes in exercise habits. He has tried various fiber supplements, including Metamucil, to manage his cholesterol.  He experiences lethargy and occasionally falls asleep on the couch after work but finds energy for activities like remodeling his house. 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 No  new chest pains, heart racing, or breathing troubles.  PMH, [facility], social history reviewed and updated  Review of Systems  Constitutional:  Positive for fatigue.  HENT: Negative.    Eyes: Negative.   Respiratory:  Negative for cough, chest tightness and shortness of breath.   Cardiovascular:  Negative for chest pain, palpitations and leg swelling.  Gastrointestinal:  Positive for diarrhea. Negative for abdominal distention, abdominal pain, constipation, nausea and vomiting.  Musculoskeletal: Negative.   Skin: Negative.   Neurological: Negative.   Psychiatric/Behavioral:  Positive for sleep disturbance.     Objective:  Physical Exam Constitutional:      Appearance: He is well-developed.  HENT:     Head: Normocephalic and atraumatic.  Cardiovascular:     Rate and Rhythm: Normal rate and regular rhythm.  Pulmonary:     Effort: Pulmonary effort is normal. No respiratory distress.     Breath sounds: Normal breath sounds. No wheezing or rales.  Abdominal:     General: Bowel sounds are normal. There is no distension.     Palpations: Abdomen is soft.     Tenderness: There is no abdominal tenderness.  Musculoskeletal:     Cervical back: Normal range of motion.  Skin:    General: Skin is warm and dry.  Neurological:     Mental Status: He is alert and oriented to person, place, and time.     Coordination: Coordination normal.     Vitals:   03/05/24 1023  BP: 128/80  Pulse: 60  Temp: (!) 97.5 F (36.4 C)  TempSrc: Oral  SpO2: 97%  Weight: 172 lb 3.2 oz (78.1 kg)  Height: 5' 9 (1.753 m)    Assessment & Plan:   "

## 2024-03-08 ENCOUNTER — Encounter: Payer: Self-pay | Admitting: Internal Medicine

## 2024-03-08 ENCOUNTER — Ambulatory Visit: Payer: Self-pay | Admitting: Internal Medicine

## 2024-03-08 DIAGNOSIS — R5383 Other fatigue: Secondary | ICD-10-CM | POA: Insufficient documentation

## 2024-03-08 NOTE — Assessment & Plan Note (Signed)
 Would like to resume trazodone  instead of clonazepam which he had been prescribed by another prescriber. He will talk with them and let us  know when he needs rx.

## 2024-03-08 NOTE — Assessment & Plan Note (Signed)
 Checking B12 and vitamin d  and testosterone  to assess for deficits given ongoing fatigue even 2 years post rectal cancer treatment.

## 2024-03-08 NOTE — Assessment & Plan Note (Signed)
 Flu shot up to date. Pneumonia counseled. Tetanus upto date. Colonoscopy upto date. Counseled about sun safety and mole surveillance. Counseled about the dangers of distracted driving. Given 10 year screening recommendations.

## 2024-03-08 NOTE — Assessment & Plan Note (Signed)
 Still doing pelvic floor which has improved accidents. Up to date on screening colonoscopy.

## 2024-03-08 NOTE — Assessment & Plan Note (Signed)
 Recent labs normal and continue levothyroxine  at same dose.

## 2024-03-11 ENCOUNTER — Encounter: Admitting: Internal Medicine
